# Patient Record
Sex: Male | Born: 1960 | Race: White | Hispanic: No | Marital: Married | Smoking: Never smoker
Health system: Southern US, Community
[De-identification: ages and names within clinical notes are randomized; demographics above are authoritative.]

## PROBLEM LIST (undated history)

## (undated) DIAGNOSIS — K589 Irritable bowel syndrome without diarrhea: Secondary | ICD-10-CM

## (undated) DIAGNOSIS — L570 Actinic keratosis: Secondary | ICD-10-CM

## (undated) DIAGNOSIS — I10 Essential (primary) hypertension: Secondary | ICD-10-CM

## (undated) DIAGNOSIS — F40240 Claustrophobia: Secondary | ICD-10-CM

## (undated) DIAGNOSIS — F411 Generalized anxiety disorder: Secondary | ICD-10-CM

## (undated) DIAGNOSIS — J302 Other seasonal allergic rhinitis: Secondary | ICD-10-CM

## (undated) DIAGNOSIS — M5126 Other intervertebral disc displacement, lumbar region: Secondary | ICD-10-CM

## (undated) DIAGNOSIS — E119 Type 2 diabetes mellitus without complications: Secondary | ICD-10-CM

## (undated) HISTORY — DX: Claustrophobia: F40.240

## (undated) HISTORY — DX: Actinic keratosis: L57.0

## (undated) HISTORY — DX: Type 2 diabetes mellitus without complications: E11.9

## (undated) HISTORY — PX: SPINE SURGERY: SHX786

## (undated) HISTORY — PX: TONSILLECTOMY: SUR1361

## (undated) HISTORY — DX: Irritable bowel syndrome, unspecified: K58.9

## (undated) HISTORY — DX: Other seasonal allergic rhinitis: J30.2

## (undated) HISTORY — DX: Other intervertebral disc displacement, lumbar region: M51.26

## (undated) HISTORY — DX: Generalized anxiety disorder: F41.1

## (undated) HISTORY — PX: COLON SURGERY: SHX602

## (undated) HISTORY — DX: Essential (primary) hypertension: I10

---

## 2010-12-05 HISTORY — PX: COLONOSCOPY: SHX174

## 2014-05-21 ENCOUNTER — Ambulatory Visit: Payer: Self-pay | Admitting: Internal Medicine

## 2014-06-21 ENCOUNTER — Encounter: Payer: Self-pay | Admitting: Family Medicine

## 2014-06-23 ENCOUNTER — Ambulatory Visit (INDEPENDENT_AMBULATORY_CARE_PROVIDER_SITE_OTHER): Payer: BC Managed Care – PPO | Admitting: Family Medicine

## 2014-06-23 ENCOUNTER — Encounter: Payer: Self-pay | Admitting: Family Medicine

## 2014-06-23 VITALS — BP 136/90 | HR 72 | Temp 98.2°F | Ht 67.75 in | Wt 211.7 lb

## 2014-06-23 DIAGNOSIS — K589 Irritable bowel syndrome without diarrhea: Secondary | ICD-10-CM | POA: Insufficient documentation

## 2014-06-23 DIAGNOSIS — J302 Other seasonal allergic rhinitis: Secondary | ICD-10-CM

## 2014-06-23 DIAGNOSIS — J309 Allergic rhinitis, unspecified: Secondary | ICD-10-CM

## 2014-06-23 DIAGNOSIS — M25529 Pain in unspecified elbow: Secondary | ICD-10-CM

## 2014-06-23 DIAGNOSIS — E669 Obesity, unspecified: Secondary | ICD-10-CM | POA: Insufficient documentation

## 2014-06-23 DIAGNOSIS — I10 Essential (primary) hypertension: Secondary | ICD-10-CM

## 2014-06-23 DIAGNOSIS — F411 Generalized anxiety disorder: Secondary | ICD-10-CM | POA: Insufficient documentation

## 2014-06-23 DIAGNOSIS — M25521 Pain in right elbow: Secondary | ICD-10-CM | POA: Insufficient documentation

## 2014-06-23 DIAGNOSIS — E66811 Obesity, class 1: Secondary | ICD-10-CM | POA: Insufficient documentation

## 2014-06-23 MED ORDER — HYDROCHLOROTHIAZIDE 25 MG PO TABS: 25.0000 mg | ORAL_TABLET | Freq: Every day | ORAL | Status: DC

## 2014-06-23 MED ORDER — LOSARTAN POTASSIUM 100 MG PO TABS: 100.0000 mg | ORAL_TABLET | Freq: Every day | ORAL | Status: DC

## 2014-06-23 NOTE — Assessment & Plan Note (Signed)
Worsened after divorce. Well controlled on effexor xr 75mg  daily Continue med.

## 2014-06-23 NOTE — Patient Instructions (Addendum)
Try exercises for elbow pain. Stop amlodipine and hydrochlorothiazide. Start losartan 100mg  once daily - return in 10 days for kidney function check and other fasting labwork. Continue effexor. Good to meet you today, call us with questions. Return in 2-3 months for follow up blood pressure

## 2014-06-23 NOTE — Progress Notes (Signed)
Pre visit review using our clinic review tool, if applicable. No additional management support is needed unless otherwise documented below in the visit note. 

## 2014-06-23 NOTE — Progress Notes (Signed)
BP 136/90  Pulse 72  Temp(Src) 98.2 F (36.8 C) (Oral)  Ht 5' 7.75" (1.721 m)  Wt 211 lb 11 oz (96.021 kg)  BMI 32.42 kg/m2   CC: new pt to establish care  Subjective:    Patient ID: Jermaine Harris, male    DOB: 27-Sep-1961, 53 y.o.   MRN: [id number]  HPI: Merril Nagy is a 53 y.o. male presenting on 06/23/2014 for [place]   Prior saw [employer] practice. Recent trip to [place] for fishing.  ~15 lb weight gain in last 1.5 years.  HTN - complaint with amlodipine 10mg  daily as well as hctz 25mg  daily. Actually ran out of hctz 2 wks ago. H/o cough on ACEI. No HA, vision changes, CP/tightness, SOB, leg swelling. States amlodipine and hctz have not really lower blood pressures.  Anxiety - with claustrophobia leading to panic attacks. Trouble after recent divorce. Some sexual dysfunction. Happy with effexor xr effect (takes 75mg  daily).  IBS - stable recently.  R tennis elbow - treated with band and advil.  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 HTN (hypertension)   . Claustrophobia   . Seasonal allergies   . Irritable bowel syndrome   . Actinic keratosis   . GAD (generalized anxiety disorder)     claustrophobia, some panic attacks     Relevant past medical, surgical, family and social history reviewed and updated as indicated.  Allergies and medications reviewed and updated. No current outpatient prescriptions on file prior to visit.   No current facility-administered medications on file prior to visit.    Review of Systems Per HPI unless specifically indicated above    Objective:    BP 136/90  Pulse 72  Temp(Src) 98.2  F (36.8 C) (Oral)  Ht 5' 7.75" (1.721 m)  Wt 211 lb 11 oz (96.021 kg)  BMI 32.42 kg/m2  Physical Exam  Nursing note and vitals reviewed. Constitutional: He appears well-developed and well-nourished. No distress.  HENT:  Head: Normocephalic and atraumatic.  Mouth/Throat: Oropharynx is clear and moist. No oropharyngeal exudate.  Eyes: Conjunctivae and EOM are normal. Pupils are equal, round, and reactive to light. No scleral icterus.  Neck: Normal range of motion. Neck supple. No thyromegaly present.  Cardiovascular: Normal rate, regular rhythm, normal heart sounds and intact distal pulses.   No murmur heard. Pulmonary/Chest: Effort normal and breath sounds normal. No respiratory distress. He has no wheezes. He has no rales.  Musculoskeletal: He exhibits no edema.  Lymphadenopathy:    He has no cervical adenopathy.  Skin: Skin is warm and dry. No rash noted.  Psychiatric: He has a normal mood and affect.   No results found for this or any previous visit.    Assessment & Plan:   Problem List Items Addressed This Visit   Seasonal allergies   Right elbow pain     More consistent with elbow tendonitis - provided with exercises from [facility] pt advisor.    Obesity   Irritable bowel syndrome     Stable on effexor.    HTN (hypertension) - Primary     Per patient, amlodipine and hctz both have not affected blood pressures. Will stop these meds,  retrial losartan at 100mg  daily rtc 10d for labwork to check Cr. Pt agrees with plan.    Relevant Medications      losartan (COZAAR) tablet   GAD (generalized anxiety disorder)     Worsened after divorce. Well controlled on effexor xr 75mg  daily Continue med.        Follow up plan: Return in about 3 months (around 09/23/2014), or if symptoms worsen or fail to improve.

## 2014-06-23 NOTE — Assessment & Plan Note (Signed)
More consistent with elbow tendonitis - provided with exercises from [facility] pt advisor.

## 2014-06-23 NOTE — Assessment & Plan Note (Signed)
Stable on effexor.   

## 2014-06-23 NOTE — Assessment & Plan Note (Signed)
Per patient, amlodipine and hctz both have not affected blood pressures. Will stop these meds, retrial losartan at 100mg  daily rtc 10d for labwork to check Cr. Pt agrees with plan.

## 2014-07-03 ENCOUNTER — Other Ambulatory Visit (INDEPENDENT_AMBULATORY_CARE_PROVIDER_SITE_OTHER): Payer: BC Managed Care – PPO

## 2014-07-03 ENCOUNTER — Encounter: Payer: Self-pay | Admitting: Family Medicine

## 2014-07-03 ENCOUNTER — Ambulatory Visit (INDEPENDENT_AMBULATORY_CARE_PROVIDER_SITE_OTHER): Payer: BC Managed Care – PPO | Admitting: Family Medicine

## 2014-07-03 VITALS — BP 130/80 | HR 80 | Temp 98.5°F | Wt 213.8 lb

## 2014-07-03 DIAGNOSIS — I10 Essential (primary) hypertension: Secondary | ICD-10-CM

## 2014-07-03 DIAGNOSIS — M5430 Sciatica, unspecified side: Secondary | ICD-10-CM

## 2014-07-03 DIAGNOSIS — M5431 Sciatica, right side: Secondary | ICD-10-CM | POA: Insufficient documentation

## 2014-07-03 LAB — COMPREHENSIVE METABOLIC PANEL
ALT: 41 U/L (ref 0–53)
AST: 25 U/L (ref 0–37)
Albumin: 4.2 g/dL (ref 3.5–5.2)
Alkaline Phosphatase: 64 U/L (ref 39–117)
BUN: 23 mg/dL (ref 6–23)
CO2: 26 mEq/L (ref 19–32)
Calcium: 9.1 mg/dL (ref 8.4–10.5)
Chloride: 110 mEq/L (ref 96–112)
Creatinine, Ser: 1.3 mg/dL (ref 0.4–1.5)
GFR: 60.54 mL/min (ref >60.00)
Glucose, Bld: 124 mg/dL — ABNORMAL HIGH (ref 70–99)
Potassium: 4.3 mEq/L (ref 3.5–5.1)
Sodium: 142 mEq/L (ref 135–145)
Total Bilirubin: 0.4 mg/dL (ref 0.2–1.2)
Total Protein: 7.3 g/dL (ref 6.0–8.3)

## 2014-07-03 LAB — TSH: TSH: 2.98 u[IU]/mL (ref 0.35–4.50)

## 2014-07-03 LAB — LIPID PANEL
Cholesterol: 154 mg/dL (ref 0–200)
HDL: 35 mg/dL — ABNORMAL LOW (ref >39.00)
LDL Cholesterol: 79 mg/dL (ref 0–99)
NonHDL: 119
Total CHOL/HDL Ratio: 4
Triglycerides: 199 mg/dL — ABNORMAL HIGH (ref 0.0–149.0)
VLDL: 39.8 mg/dL (ref 0.0–40.0)

## 2014-07-03 MED ORDER — CYCLOBENZAPRINE HCL 10 MG PO TABS: 5.0000 mg | ORAL_TABLET | Freq: Two times a day (BID) | ORAL | Status: DC | PRN

## 2014-07-03 MED ORDER — PREDNISONE 20 MG PO TABS: ORAL_TABLET | ORAL | Status: DC

## 2014-07-03 NOTE — Patient Instructions (Signed)
I do think we have sciatica causing your pain - treat with prednisone taper and muscle relaxant Do stretching exercises provided today. May ice/heat buttock (whichever soothes area better). Let us know if not improving as expected.  Watch for new symptoms like weakness or worsening numbness of leg, fever, or any bowel/bladder accidents.

## 2014-07-03 NOTE — Progress Notes (Signed)
   BP 130/80  Pulse 80  Temp(Src) 98.5 F (36.9 C) (Oral)  Wt 213 lb 12 oz (96.956 kg)   CC: R leg pain  Subjective:    Patient ID: Jermaine Harris, male    DOB: 1961/09/24, 53 y.o.   MRN: [id number]  HPI: Jermaine Harris is a 53 y.o. male presenting on 07/03/2014 for Back Pain   Worsening spasm R leg - started 2 wks ago in R calf and over last 3 days noticed worsening pain. Worse with bending over and with transition from sitting to standing. Improved with laying down.  Tried ibuprofen, heating pad, tried tizanidine (some sedation).   No fevers/chills, denies inciting trauma, bowel/bladder accidents, leg numbness.  Relevant past medical, surgical, family and social history reviewed and updated as indicated.  Allergies and medications reviewed and updated. Current Outpatient Prescriptions on File Prior to Visit  Medication Sig  . ibuprofen (ADVIL,MOTRIN) 200 MG tablet Take 200 mg by mouth as needed.  Marland Kitchen losartan (COZAAR) 100 MG tablet Take 1 tablet (100 mg total) by mouth daily.  Marland Kitchen venlafaxine XR (EFFEXOR-XR) 75 MG 24 hr capsule Take 75 mg by mouth daily with breakfast.   No current facility-administered medications on file prior to visit.    Review of Systems Per HPI unless specifically indicated above    Objective:    BP 130/80  Pulse 80  Temp(Src) 98.5 F (36.9 C) (Oral)  Wt 213 lb 12 oz (96.956 kg)  Physical Exam  Nursing note and vitals reviewed. Constitutional: He appears well-developed and well-nourished. No distress.  Musculoskeletal: He exhibits no edema.  No pain midline spine No paraspinous mm tenderness + SLR on right. No pain with int/ext rotation at hip. Neg FABER. No pain at SIJ, GTB or sciatic notch bilaterally.   Neurological: He is alert. He has normal strength. No sensory deficit.  Reflex Scores:      Patellar reflexes are 2+ on the right side and 2+ on the left side. 5/5 BLE strength Intact to light touch BLE       Assessment & Plan:    Problem List Items Addressed This Visit   Right sided sciatica - Primary     Anticipate R sided sciatica from piriformis syndrome although no pain at sciatic notch today. Discussed treatment with prednisone (avoid NSAIDs given recent borderline kidney function) and flexeril course. Also discussed ice/heat and provided with exercises from [facility] pt advisor for piriformis syndrome. Update if sxs persist or fail to improve. Red flags to return discussed.    Relevant Medications      cyclobenzaprine (FLEXERIL) tablet       Follow up plan: Return if symptoms worsen or fail to improve.

## 2014-07-03 NOTE — Assessment & Plan Note (Signed)
Anticipate R sided sciatica from piriformis syndrome although no pain at sciatic notch today. Discussed treatment with prednisone (avoid NSAIDs given recent borderline kidney function) and flexeril course. Also discussed ice/heat and provided with exercises from [facility] pt advisor for piriformis syndrome. Update if sxs persist or fail to improve. Red flags to return discussed.

## 2014-07-03 NOTE — Progress Notes (Signed)
Pre visit review using our clinic review tool, if applicable. No additional management support is needed unless otherwise documented below in the visit note. 

## 2014-07-10 ENCOUNTER — Telehealth: Payer: Self-pay

## 2014-07-10 MED ORDER — PREDNISONE 20 MG PO TABS
ORAL_TABLET | ORAL | Status: DC
Start: 2014-07-10 — End: 2014-09-30

## 2014-07-10 NOTE — Telephone Encounter (Signed)
Patient advised.

## 2014-07-10 NOTE — Telephone Encounter (Signed)
Pt left v/m; pt was seen on 07/03/14; pt has finished prednisone for sciatica. Pt had gotten relief but since stopping prednisone pain has returned. Pt is leaving very early morning flight 07/11/14 for week long business trip out of country. Pt request ref[facility]. Pt request cb.

## 2014-07-10 NOTE — Telephone Encounter (Signed)
Sent, thanks. Take with food. F/u prn.

## 2014-09-30 ENCOUNTER — Ambulatory Visit (INDEPENDENT_AMBULATORY_CARE_PROVIDER_SITE_OTHER): Payer: BC Managed Care – PPO | Admitting: Family Medicine

## 2014-09-30 ENCOUNTER — Telehealth: Payer: Self-pay | Admitting: Family Medicine

## 2014-09-30 ENCOUNTER — Encounter: Payer: Self-pay | Admitting: Family Medicine

## 2014-09-30 VITALS — BP 130/90 | HR 72 | Temp 97.8°F | Wt 217.0 lb

## 2014-09-30 DIAGNOSIS — F411 Generalized anxiety disorder: Secondary | ICD-10-CM

## 2014-09-30 DIAGNOSIS — I10 Essential (primary) hypertension: Secondary | ICD-10-CM

## 2014-09-30 MED ORDER — LOSARTAN POTASSIUM 100 MG PO TABS: 100.0000 mg | ORAL_TABLET | Freq: Every day | ORAL | Status: DC

## 2014-09-30 NOTE — Progress Notes (Signed)
Pre visit review using our clinic review tool, if applicable. No additional management support is needed unless otherwise documented below in the visit note. 

## 2014-09-30 NOTE — Patient Instructions (Addendum)
Good to see you today, call us with questions. Return at your convenience or in 6 months for annual exam. Continue monitoring blood pressure and let me know if persistently >140/90.  Your goal blood pressure is <140/90. Work on low salt/sodium diet - goal <1.5gm (1,500mg ) per day. Eat a diet high in fruits/vegetables and whole grains.  Look into mediterranean and DASH diet. Goal activity is 171min/wk of moderate intensity exercise.  This can be split into 30 minute chunks.  If you are not at this level, you can start with smaller 10-15 min increments and slowly build up activity. Look at [place].org for more resources  DASH Eating Plan DASH stands for "Dietary Approaches to Stop Hypertension." The DASH eating plan is a healthy eating plan that has been shown to reduce high blood pressure (hypertension). Additional health benefits may include reducing the risk of type 2 diabetes mellitus, heart disease, and stroke. The DASH eating plan may also help with weight loss. WHAT DO I NEED TO KNOW ABOUT THE DASH EATING PLAN? For the DASH eating plan, you will follow these general guidelines:  Choose foods with a percent daily value for sodium of less than 5% (as listed on the food label).  Use salt-free seasonings or herbs instead of table salt or sea salt.  Check with your health care provider or pharmacist before using salt substitutes.  Eat lower-sodium products, often labeled as "lower sodium" or "no salt added."  Eat fresh foods.  Eat more vegetables, fruits, and low-fat dairy products.  Choose whole grains. Look for the word "whole" as the first word in the ingredient list.  Choose fish and skinless chicken or [place] more often than red meat. Limit fish, poultry, and meat to 6 oz (170 g) each day.  Limit sweets, desserts, sugars, and sugary drinks.  Choose heart-healthy fats.  Limit cheese to 1 oz (28 g) per day.  Eat more home-cooked food and less restaurant, buffet, and fast  food.  Limit fried foods.  Cook foods using methods other than frying.  Limit canned vegetables. If you do use them, rinse them well to decrease the sodium.  When eating at a restaurant, ask that your food be prepared with less salt, or no salt if possible. WHAT FOODS CAN I EAT? Seek help from a dietitian for individual calorie needs. Grains Whole grain or whole wheat bread. Brown rice. Whole grain or whole wheat pasta. Quinoa, bulgur, and whole grain cereals. Low-sodium cereals. Corn or whole wheat flour tortillas. Whole grain cornbread. Whole grain crackers. Low-sodium crackers. Vegetables Fresh or frozen vegetables (raw, steamed, roasted, or grilled). Low-sodium or reduced-sodium tomato and vegetable juices. Low-sodium or reduced-sodium tomato sauce and paste. Low-sodium or reduced-sodium canned vegetables.  Fruits All fresh, canned (in natural juice), or frozen fruits. Meat and Other Protein Products Ground beef (85% or leaner), grass-fed beef, or beef trimmed of fat. Skinless chicken or [place]. Ground chicken or [place]. Pork trimmed of fat. All fish and seafood. Eggs. Dried beans, peas, or lentils. Unsalted nuts and seeds. Unsalted canned beans. Dairy Low-fat dairy products, such as skim or 1% milk, 2% or reduced-fat cheeses, low-fat ricotta or cottage cheese, or plain low-fat yogurt. Low-sodium or reduced-sodium cheeses. Fats and Oils Tub margarines without trans fats. Light or reduced-fat mayonnaise and salad dressings (reduced sodium). Avocado. Safflower, olive, or canola oils. Natural peanut or almond butter. Other Unsalted popcorn and pretzels. The items listed above may not be a complete list of recommended foods or beverages. Contact your  dietitian for more options. WHAT FOODS ARE NOT RECOMMENDED? Grains White bread. White pasta. White rice. Refined cornbread. Bagels and croissants. Crackers that contain trans fat. Vegetables Creamed or fried vegetables. Vegetables in a  cheese sauce. Regular canned vegetables. Regular canned tomato sauce and paste. Regular tomato and vegetable juices. Fruits Dried fruits. Canned fruit in light or heavy syrup. Fruit juice. Meat and Other Protein Products Fatty cuts of meat. Ribs, chicken wings, bacon, sausage, bologna, salami, chitterlings, fatback, hot dogs, bratwurst, and packaged luncheon meats. Salted nuts and seeds. Canned beans with salt. Dairy Whole or 2% milk, cream, half-and-half, and cream cheese. Whole-fat or sweetened yogurt. Full-fat cheeses or blue cheese. Nondairy creamers and whipped toppings. Processed cheese, cheese spreads, or cheese curds. Condiments Onion and garlic salt, seasoned salt, table salt, and sea salt. Canned and packaged gravies. Worcestershire sauce. Tartar sauce. Barbecue sauce. Teriyaki sauce. Soy sauce, including reduced sodium. Steak sauce. Fish sauce. Oyster sauce. Cocktail sauce. Horseradish. Ketchup and mustard. Meat flavorings and tenderizers. Bouillon cubes. Hot sauce. Tabasco sauce. Marinades. Taco seasonings. Relishes. Fats and Oils Butter, stick margarine, lard, shortening, ghee, and bacon fat. Coconut, palm kernel, or palm oils. Regular salad dressings. Other Pickles and olives. Salted popcorn and pretzels. The items listed above may not be a complete list of foods and beverages to avoid. Contact your dietitian for more information. WHERE CAN I FIND MORE INFORMATION? National Heart, Lung, and Blood Institute: travelstabloid.com Document Released: 11/10/2011 Document Revised: 04/07/2014 Document Reviewed: 09/25/2013 [facility] Patient Information 2015 [place], [place]. This information is not intended to replace advice given to you by your health care provider. Make sure you discuss any questions you have with your health care provider.

## 2014-09-30 NOTE — Assessment & Plan Note (Signed)
Stable on effexor xr 75mg  daily. Continue med.

## 2014-09-30 NOTE — Assessment & Plan Note (Signed)
Chronic, stable.  Continue losartan 100 mg daily 

## 2014-09-30 NOTE — Telephone Encounter (Signed)
Pt will be out of Losartan in about 10 day and will need a refill.  [facility] [address]

## 2014-09-30 NOTE — Telephone Encounter (Signed)
Refilled

## 2014-09-30 NOTE — Progress Notes (Signed)
BP 130/90  Pulse 72  Temp(Src) 97.8 F (36.6 C) (Oral)  Wt 217 lb (98.431 kg)   CC: 3 mo f/u visit  Subjective:    Patient ID: Jermaine Harris, male    DOB: 1961-11-18, 53 y.o.   MRN: [id number]  HPI: Jermaine Harris is a 54 y.o. male presenting on 09/30/2014 for Follow-up   HTN - per patient, amlodipine, hctz were not effective to control bp - so 06/2014 we retried losartan 100mg  daily. complaint with this med. At drug store bp has been elevated. Hasn't checked in last month. H/o cough on ACEI. Denies HA, vision changes, CP/tightness, SOB, leg swelling. Lab Results  Component Value Date   CREATININE 1.3 07/03/2014   BP Readings from Last 3 Encounters:  09/30/14 130/90  07/03/14 130/80  06/23/14 136/90   Intermittent R calf pain thought sciatica related. Some flaring after trip to [place]. Has stretching exercises at home.   Anxiety - with claustrophobia leading to panic attacks. Trouble after recent divorce. Some sexual dysfunction. Happy with effexor xr effect (takes 75mg  daily). Busy at work.   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   Relevant past medical, surgical, family and social history reviewed and updated as indicated.  Allergies and medications reviewed and updated. Current Outpatient Prescriptions on File Prior to Visit  Medication Sig  . ibuprofen (ADVIL,MOTRIN) 200 MG tablet Take 200 mg by mouth as needed.  Marland Kitchen losartan (COZAAR) 100 MG tablet Take 1 tablet (100 mg total) by mouth daily.  Marland Kitchen venlafaxine XR (EFFEXOR-XR) 75 MG 24 hr capsule Take 75 mg by mouth daily with breakfast.   No current facility-administered medications on file prior to visit.    Review of Systems Per HPI unless specifically indicated above    Objective:    BP 130/90  Pulse 72  Temp(Src) 97.8 F (36.6 C) (Oral)  Wt 217 lb (98.431 kg)  Physical Exam  Nursing note and vitals reviewed. Constitutional: He appears well-developed and well-nourished. No distress.  HENT:    Mouth/Throat: Oropharynx is clear and moist. No oropharyngeal exudate.  Cardiovascular: Normal rate, regular rhythm, normal heart sounds and intact distal pulses.   No murmur heard. Pulmonary/Chest: Effort normal and breath sounds normal. No respiratory distress. He has no wheezes. He has no rales.  Musculoskeletal: He exhibits no edema.  Psychiatric: He has a normal mood and affect.   Results for orders placed in visit on 07/03/14  LIPID PANEL      Result Value Ref Range   Cholesterol 154  0 - 200 mg/dL   Triglycerides 199.0 (*) 0.0 - 149.0 mg/dL   HDL 35.00 (*) >39.00 mg/dL   VLDL 39.8  0.0 - 40.0 mg/dL   LDL Cholesterol 79  0 - 99 mg/dL   Total CHOL/HDL Ratio 4     NonHDL 119.00    COMPREHENSIVE METABOLIC PANEL      Result Value Ref Range   Sodium 142  135 - 145 mEq/L   Potassium 4.3  3.5 - 5.1 mEq/L   Chloride 110  96 - 112 mEq/L   CO2 26  19 - 32 mEq/L   Glucose, Bld 124 (*) 70 - 99 mg/dL   BUN 23  6 - 23 mg/dL   Creatinine, Ser 1.3  0.4 - 1.5 mg/dL   Total Bilirubin 0.4  0.2 - 1.2 mg/dL   Alkaline Phosphatase 64  39 - 117 U/L   AST 25  0 - 37 U/L   ALT 41  0 - 53 U/L   Total Protein 7.3  6.0 - 8.3 g/dL   Albumin 4.2  3.5 - 5.2 g/dL   Calcium 9.1  8.4 - 10.5 mg/dL   GFR 60.54  >60.00 mL/min  TSH      Result Value Ref Range   TSH 2.98  0.35 - 4.50 uIU/mL      Assessment & Plan:   Problem List Items Addressed This Visit   HTN (hypertension) - Primary     Chronic, stable. Continue losartan 100mg  daily.    GAD (generalized anxiety disorder)     Stable on effexor xr 75mg  daily. Continue med.        Follow up plan: Return in about 6 months (around 04/01/2015), or as needed, for annual exam, prior fasting for blood work.

## 2014-09-30 NOTE — Addendum Note (Signed)
Addended by: Royann Shivers A on: 09/30/2014 09:33 AM   Modules accepted: Orders

## 2014-10-01 ENCOUNTER — Telehealth: Payer: Self-pay | Admitting: Family Medicine

## 2014-10-01 NOTE — Telephone Encounter (Signed)
emmi emailed °

## 2014-11-12 ENCOUNTER — Encounter: Payer: Self-pay | Admitting: Family Medicine

## 2014-11-12 ENCOUNTER — Ambulatory Visit (INDEPENDENT_AMBULATORY_CARE_PROVIDER_SITE_OTHER): Payer: BC Managed Care – PPO | Admitting: Family Medicine

## 2014-11-12 VITALS — BP 140/90 | HR 80 | Temp 97.7°F | Wt 214.5 lb

## 2014-11-12 DIAGNOSIS — M5431 Sciatica, right side: Secondary | ICD-10-CM

## 2014-11-12 MED ORDER — PREDNISONE 20 MG PO TABS: ORAL_TABLET | ORAL | Status: DC

## 2014-11-12 NOTE — Patient Instructions (Addendum)
Recurrent sciatica - treat with another prednisone course sent to pharmacy. Work on activity and weight loss as up to now - congratulations on healthy changes.

## 2014-11-12 NOTE — Assessment & Plan Note (Signed)
Recurrent R sciatica anticipate from piriformis syndrome. Treat with prednisone course as well as tylenol prn pain while on prednisone then may return to NsAID after finishes prednisone course. Discussed healthy lifestyle /diet changes for weight loss to help prevent recurrence of pain. Update if not improving as expected, would consider lumbar imaging.

## 2014-11-12 NOTE — Progress Notes (Signed)
Pre visit review using our clinic review tool, if applicable. No additional management support is needed unless otherwise documented below in the visit note. 

## 2014-11-12 NOTE — Progress Notes (Signed)
BP 140/90 mmHg  Pulse 80  Temp(Src) 97.7 F (36.5 C) (Oral)  Wt 214 lb 8 oz (97.297 kg)   CC: L leg pain  Subjective:    Patient ID: Jermaine Harris, male    DOB: 01-May-1961, 53 y.o.   MRN: [id number]  HPI: Jermaine Harris is a 53 y.o. male presenting on 11/12/2014 for Leg Pain   See prior notes for details - briefly, treated here 07/03/2014 for R sided sciatica from piriformis syndrome with prednisone course (no NSAIDs given borderline kidney function) and flexeril course. Prednisone course was repeated a few weeks later with good improvement.   Mid October on trip to [facility] noticed back pain start bothering him again. R lowe back with radiation down posterior leg and into calf.  Denies numbness/weakness of leg. No fevers, bowel/bladder changes. Massage therapy helped. Ibuprofen helps.  Wants to work on weight loss. Cutting down on carbs, sweets, decreased lunch portion sizes. Noticing 8 lb weight loss over last week. Started using myfitness pal app. Working on 1400cal diet. Goal <200 lbs by late January. Thinking about increasing activity levels  Went through divorce in May, increased stress.   Relevant past medical, surgical, family and social history reviewed and updated as indicated. Interim medical history since our last visit reviewed. Allergies and medications reviewed and updated.  Current Outpatient Prescriptions on File Prior to Visit  Medication Sig  . ibuprofen (ADVIL,MOTRIN) 200 MG tablet Take 200 mg by mouth as needed.  Marland Kitchen losartan (COZAAR) 100 MG tablet Take 1 tablet (100 mg total) by mouth daily.  Marland Kitchen venlafaxine XR (EFFEXOR-XR) 75 MG 24 hr capsule Take 75 mg by mouth daily with breakfast.   No current facility-administered medications on file prior to visit.    Review of Systems Per HPI unless specifically indicated above     Objective:    BP 140/90 mmHg  Pulse 80  Temp(Src) 97.7 F (36.5 C) (Oral)  Wt 214 lb 8 oz (97.297 kg)  Wt Readings from Last 3  Encounters:  11/12/14 214 lb 8 oz (97.297 kg)  09/30/14 217 lb (98.431 kg)  07/03/14 213 lb 12 oz (96.956 kg)    Physical Exam  Constitutional: He appears well-developed and well-nourished. No distress.  Musculoskeletal: He exhibits no edema.  No pain midline spine No paraspinous mm tenderness Neg SLR bilaterally. No pain with int/ext rotation at hip. Neg FABER. No pain at SIJ, GTB bilaterally. Tender at R sciatic notch  Neurological: He has normal strength. No sensory deficit.  Reflex Scores:      Patellar reflexes are 2+ on the right side and 2+ on the left side. 5/5 strength BUE  Nursing note and vitals reviewed.  Results for orders placed or performed in visit on 07/03/14  Lipid panel  Result Value Ref Range   Cholesterol 154 0 - 200 mg/dL   Triglycerides 199.0 (H) 0.0 - 149.0 mg/dL   HDL 35.00 (L) >39.00 mg/dL   VLDL 39.8 0.0 - 40.0 mg/dL   LDL Cholesterol 79 0 - 99 mg/dL   Total CHOL/HDL Ratio 4    NonHDL 119.00   Comprehensive metabolic panel  Result Value Ref Range   Sodium 142 135 - 145 mEq/L   Potassium 4.3 3.5 - 5.1 mEq/L   Chloride 110 96 - 112 mEq/L   CO2 26 19 - 32 mEq/L   Glucose, Bld 124 (H) 70 - 99 mg/dL   BUN 23 6 - 23 mg/dL   Creatinine, Ser 1.3 0.4 - 1.5  mg/dL   Total Bilirubin 0.4 0.2 - 1.2 mg/dL   Alkaline Phosphatase 64 39 - 117 U/L   AST 25 0 - 37 U/L   ALT 41 0 - 53 U/L   Total Protein 7.3 6.0 - 8.3 g/dL   Albumin 4.2 3.5 - 5.2 g/dL   Calcium 9.1 8.4 - 10.5 mg/dL   GFR 60.54 >60.00 mL/min  TSH  Result Value Ref Range   TSH 2.98 0.35 - 4.50 uIU/mL      Assessment & Plan:   Problem List Items Addressed This Visit    Right sided sciatica - Primary    Recurrent R sciatica anticipate from piriformis syndrome. Treat with prednisone course as well as tylenol prn pain while on prednisone then may return to NsAID after finishes prednisone course. Discussed healthy lifestyle /diet changes for weight loss to help prevent recurrence of  pain. Update if not improving as expected, would consider lumbar imaging.        Follow up plan: Return if symptoms worsen or fail to improve.

## 2014-12-01 ENCOUNTER — Other Ambulatory Visit: Payer: Self-pay | Admitting: *Deleted

## 2014-12-01 MED ORDER — VENLAFAXINE HCL ER 75 MG PO CP24: 75.0000 mg | ORAL_CAPSULE | Freq: Every day | ORAL | Status: DC

## 2014-12-01 NOTE — Telephone Encounter (Signed)
#  30 x 2 sent electronically to pharmacy. Pt has a cpx scheduled in 5/16.

## 2014-12-24 ENCOUNTER — Ambulatory Visit (INDEPENDENT_AMBULATORY_CARE_PROVIDER_SITE_OTHER): Payer: BLUE CROSS/BLUE SHIELD | Admitting: Family Medicine

## 2014-12-24 ENCOUNTER — Encounter: Payer: Self-pay | Admitting: Family Medicine

## 2014-12-24 ENCOUNTER — Ambulatory Visit (INDEPENDENT_AMBULATORY_CARE_PROVIDER_SITE_OTHER)
Admission: RE | Admit: 2014-12-24 | Discharge: 2014-12-24 | Disposition: A | Discharge status: Home / Self Care | Payer: BLUE CROSS/BLUE SHIELD | Source: Ambulatory Visit | Attending: Family Medicine | Admitting: Family Medicine

## 2014-12-24 VITALS — BP 148/90 | HR 68 | Temp 97.6°F | Wt 212.2 lb

## 2014-12-24 DIAGNOSIS — M5431 Sciatica, right side: Secondary | ICD-10-CM

## 2014-12-24 DIAGNOSIS — J069 Acute upper respiratory infection, unspecified: Secondary | ICD-10-CM

## 2014-12-24 MED ORDER — PREDNISONE 20 MG PO TABS: ORAL_TABLET | ORAL | Status: DC

## 2014-12-24 MED ORDER — METHOCARBAMOL 500 MG PO TABS: 500.0000 mg | ORAL_TABLET | Freq: Three times a day (TID) | ORAL | Status: DC | PRN

## 2014-12-24 NOTE — Addendum Note (Signed)
Addended by: Ria Bush on: 12/24/2014 10:57 AM   Modules accepted: Orders, SmartSet

## 2014-12-24 NOTE — Assessment & Plan Note (Signed)
With mild laryngitis. Supportive care as per instructions. Viral given short duration.

## 2014-12-24 NOTE — Patient Instructions (Signed)
For upper respiratory infection - treat with fluids and rest. May continue mucinex DM. For sciatica - continue ibuprofen, try different muscle relaxant sent to pharmacy. Prednisone prescription provided today. Xray of lower back today. Pass by [facility]'s office to schedule physical therapy. Let us know how you do after therapy.

## 2014-12-24 NOTE — Progress Notes (Signed)
Pre visit review using our clinic review tool, if applicable. No additional management support is needed unless otherwise documented below in the visit note. 

## 2014-12-24 NOTE — Assessment & Plan Note (Addendum)
Anticipate persistent piriformis syndrome. No red flags today. Check lumbar xray today given longevity of sxs and refer to PT.  Treat with another prednisone course as well as trial of robaxin. Update [place] with effect after PT. Pt agrees with plan.

## 2014-12-24 NOTE — Progress Notes (Addendum)
BP 148/90 mmHg  Pulse 68  Temp(Src) 97.6 F (36.4 C) (Oral)  Wt 212 lb 4 oz (96.276 kg)   CC: URI, worsening leg pain  Subjective:    Patient ID: Jermaine Harris, male    DOB: 1961/01/03, 54 y.o.   MRN: [id number]  HPI: Jermaine Harris is a 54 y.o. male presenting on 12/24/2014 for Leg Pain and URI   Recent return from [place] (spent 11 days there).  See prior notes for details - briefly, treated here 07/03/2014 and again 11/2014 for R sided sciatica from piriformis syndrome with prednisone course (no NSAIDs given borderline kidney function) and flexeril course. Has received 3 prednisone courses over last several months.  Pain R buttock with radiation dowh posterior leg. Pain worse with extension of spine, worse with prolonged sitting, some trouble with weight bearing on right 2/2 pain. Pain resolves when standing. Denies numbness/tingling. Possible weakness of R leg. Continues low dose ibuprofen. Has tried flexeril as well which caused oversedation.   URI sxs - ongoing for last 5 days. +sinus congestion. Persistent hoarse voice. + cough but not significantly productive. No fevers/chills. Has tried OTC remedies including mucinex DM.   Relevant past medical, surgical, family and social history reviewed and updated as indicated. Interim medical history since our last visit reviewed. Allergies and medications reviewed and updated. Current Outpatient Prescriptions on File Prior to Visit  Medication Sig  . ibuprofen (ADVIL,MOTRIN) 200 MG tablet Take 200 mg by mouth as needed.  Marland Kitchen losartan (COZAAR) 100 MG tablet Take 1 tablet (100 mg total) by mouth daily.  Marland Kitchen venlafaxine XR (EFFEXOR-XR) 75 MG 24 hr capsule Take 1 capsule (75 mg total) by mouth daily with breakfast.   No current facility-administered medications on file prior to visit.    Review of Systems Per HPI unless specifically indicated above     Objective:    BP 148/90 mmHg  Pulse 68  Temp(Src) 97.6 F (36.4 C) (Oral)  Wt 212  lb 4 oz (96.276 kg)  Wt Readings from Last 3 Encounters:  12/24/14 212 lb 4 oz (96.276 kg)  11/12/14 214 lb 8 oz (97.297 kg)  09/30/14 217 lb (98.431 kg)    Physical Exam  Constitutional: He appears well-developed and well-nourished. No distress.  HENT:  Head: Normocephalic and atraumatic.  Right Ear: Hearing, tympanic membrane, external ear and ear canal normal.  Left Ear: Hearing, tympanic membrane, external ear and ear canal normal.  Nose: Nose normal. No mucosal edema or rhinorrhea. Right sinus exhibits no maxillary sinus tenderness and no frontal sinus tenderness. Left sinus exhibits no maxillary sinus tenderness and no frontal sinus tenderness.  Mouth/Throat: Uvula is midline, oropharynx is clear and moist and mucous membranes are normal. No oropharyngeal exudate, posterior oropharyngeal edema, posterior oropharyngeal erythema or tonsillar abscesses.  Eyes: Conjunctivae and EOM are normal. Pupils are equal, round, and reactive to light. No scleral icterus.  Neck: Normal range of motion. Neck supple.  Cardiovascular: Normal rate, regular rhythm, normal heart sounds and intact distal pulses.   No murmur heard. Pulmonary/Chest: Effort normal and breath sounds normal. No respiratory distress. He has no wheezes. He has no rales.  Musculoskeletal: He exhibits no edema.  No pain midline spine No paraspinous mm tenderness Neg SLR bilaterally. No pain with int/ext rotation at hip. Neg FABER. No pain at SIJ, GTB or sciatic notch bilaterally.  Lymphadenopathy:    He has no cervical adenopathy.  Neurological: He is alert. He has normal strength. No sensory deficit. Coordination and  gait normal.  Reflex Scores:      Patellar reflexes are 2+ on the right side.      Achilles reflexes are 2+ on the right side and 2+ on the left side. 5/5 strength BLE  Skin: Skin is warm and dry. No rash noted.  Nursing note and vitals reviewed.      Assessment & Plan:  A total of 25 minutes were spent  face-to-face with the patient during this encounter and over half of that time was spent on counseling and coordination of care   Problem List Items Addressed This Visit    Viral URI    With mild laryngitis. Supportive care as per instructions. Viral given short duration.        Right sided sciatica - Primary    Anticipate persistent piriformis syndrome. No red flags today. Check lumbar xray today given longevity of sxs and refer to PT.  Treat with another prednisone course as well as trial of robaxin. Update [place] with effect after PT. Pt agrees with plan.      Relevant Medications   methocarbamol (ROBAXIN) tablet   Other Relevant Orders   Ambulatory referral to Physical Therapy   DG Lumbar Spine Complete       Follow up plan: Return if symptoms worsen or fail to improve.

## 2015-02-23 ENCOUNTER — Telehealth: Payer: Self-pay

## 2015-02-23 DIAGNOSIS — I10 Essential (primary) hypertension: Secondary | ICD-10-CM

## 2015-02-23 MED ORDER — VALSARTAN 320 MG PO TABS: 320.0000 mg | ORAL_TABLET | Freq: Every day | ORAL | Status: DC

## 2015-02-23 NOTE — Telephone Encounter (Signed)
Patient notified and verbalized understanding. He will call with an update.

## 2015-02-23 NOTE — Telephone Encounter (Signed)
BP Readings from Last 3 Encounters:  12/24/14 148/90  11/12/14 140/90  09/30/14 130/90  we can try different ARB - sent in diovan (valsartan) for pt to try. Update [place] with effect after 2-3 weeks.

## 2015-02-23 NOTE — Telephone Encounter (Signed)
Pt left v/m; pt has been taking losartan 100 mg daily; pt does not think med is effective and wants to know if can have med change and request cb. Spoke with pt and BP at home is averaging high 140's/90. No H/A,dizziness, CP or SOB. [facility] Pao has [facility] scheduled 04/07/15.pt would like to try different med without coming in for appt.Please advise.

## 2015-03-05 ENCOUNTER — Other Ambulatory Visit: Payer: Self-pay | Admitting: Family Medicine

## 2015-03-26 ENCOUNTER — Ambulatory Visit (INDEPENDENT_AMBULATORY_CARE_PROVIDER_SITE_OTHER): Payer: BLUE CROSS/BLUE SHIELD | Admitting: Family Medicine

## 2015-03-26 ENCOUNTER — Encounter: Payer: Self-pay | Admitting: Family Medicine

## 2015-03-26 VITALS — BP 144/96 | HR 65 | Temp 98.0°F | Wt 207.5 lb

## 2015-03-26 DIAGNOSIS — E669 Obesity, unspecified: Secondary | ICD-10-CM

## 2015-03-26 DIAGNOSIS — I10 Essential (primary) hypertension: Secondary | ICD-10-CM | POA: Diagnosis not present

## 2015-03-26 DIAGNOSIS — M5431 Sciatica, right side: Secondary | ICD-10-CM | POA: Diagnosis not present

## 2015-03-26 MED ORDER — CLONIDINE HCL 0.1 MG PO TABS
0.1000 mg | ORAL_TABLET | Freq: Two times a day (BID) | ORAL | Status: DC
Start: 2015-03-26 — End: 2015-04-07

## 2015-03-26 NOTE — Assessment & Plan Note (Signed)
First losartan now diovan not controlling bp - pt endorses same bp readings on and off ARB. Lisinopril caused cough. Amlodipine, hctz ineffective. Will trial clonidine, start at 0.1mg  bid. Discussed rebound hypertension if suddenly stopped. Discussed monitoring heart rate for bradycardia. Pt will return on tuesday for labs and nurse visit to compare home bp with our bp readings. Has CPE appt in 2 wks.

## 2015-03-26 NOTE — Assessment & Plan Note (Addendum)
Body mass index is 31.78 kg/(m^2).  Congratulated on weight loss noted. Endorses healthy diet changes.

## 2015-03-26 NOTE — Patient Instructions (Addendum)
Stop diovan. Let's try clonidine 0.1mg  twice daily for blood pressure. Don't suddenly stop this medicine, but taper down if we need to stop it.  Continue to monitor blood pressure at home with this new medication.  Keep physical appointment for May.

## 2015-03-26 NOTE — Progress Notes (Signed)
BP 144/96 mmHg  Pulse 65  Temp(Src) 98 F (36.7 C) (Oral)  Wt 207 lb 8 oz (94.121 kg)  SpO2 98%   CC: discuss meds  Subjective:    Patient ID: Jermaine Harris, male    DOB: 04/28/1961, 54 y.o.   MRN: [id number]  HPI: Jermaine Harris is a 54 y.o. male presenting on 03/26/2015 for Medication Management and Hypertension   HTN - amlodipine, hctz were not effective to control bp. Losartan 100mg  initially effective, then bp again increased. At home, bp consistently 841 systolic. [facility]. H/o cough on ACEI. Denies HA, vision changes, CP/tightness, SOB, leg swelling.   Losing weight, trying. Down 5 lbs in last several months.  Relevant past medical, surgical, family and social history reviewed and updated as indicated. Interim medical history since our last visit reviewed. Allergies and medications reviewed and updated. Current Outpatient Prescriptions on File Prior to Visit  Medication Sig  . ibuprofen (ADVIL,MOTRIN) 200 MG tablet Take 200 mg by mouth as needed.  . valsartan (DIOVAN) 320 MG tablet Take 1 tablet (320 mg total) by mouth daily.  Marland Kitchen venlafaxine XR (EFFEXOR-XR) 75 MG 24 hr capsule TAKE 1 CAPSULE BY MOUTH DAILY WITH BREAKFAST.   No current facility-administered medications on file prior to visit.   Past Medical History  Diagnosis Date  . HTN (hypertension)   . Claustrophobia   . Seasonal allergies   . Irritable bowel syndrome   . Actinic keratosis   . GAD (generalized anxiety disorder)     claustrophobia, some panic attacks    Past Surgical History  Procedure Laterality Date  . Tonsillectomy    . Colonoscopy  2012    mild diverticulosis, rec rpt 5 yrs 2/2 fmhx polyps   Family History  Problem Relation Age of Onset  . Heart disease Father     pacer and CHF  . Transient ischemic attack Mother 26    several  . Depression Mother   . Depression Father   . Colon polyps Sister   . Sudden death Maternal Grandfather 72    ?mustard gas   Review of  Systems Per HPI unless specifically indicated above     Objective:    BP 144/96 mmHg  Pulse 65  Temp(Src) 98 F (36.7 C) (Oral)  Wt 207 lb 8 oz (94.121 kg)  SpO2 98%  Wt Readings from Last 3 Encounters:  03/26/15 207 lb 8 oz (94.121 kg)  12/24/14 212 lb 4 oz (96.276 kg)  11/12/14 214 lb 8 oz (97.297 kg)    Physical Exam  Constitutional: He appears well-developed and well-nourished. No distress.  HENT:  Head: Normocephalic and atraumatic.  Mouth/Throat: Oropharynx is clear and moist. No oropharyngeal exudate.  Neck: Normal range of motion. Neck supple. No thyromegaly present.  Cardiovascular: Normal rate, regular rhythm, normal heart sounds and intact distal pulses.   No murmur heard. Pulmonary/Chest: Effort normal and breath sounds normal. No respiratory distress. He has no wheezes. He has no rales.  Musculoskeletal: He exhibits no edema.  Lymphadenopathy:    He has no cervical adenopathy.  Skin: Skin is warm and dry. No rash noted.  Psychiatric: He has a normal mood and affect.  Nursing note and vitals reviewed.       Assessment & Plan:   Problem List Items Addressed This Visit    Right sided sciatica    Resolved with adding lumbar support to recliner (extra pillow)      Obesity    Body mass  index is 31.78 kg/(m^2).  Congratulated on weight loss noted. Endorses healthy diet changes.      Essential hypertension - Primary    First losartan now diovan not controlling bp - pt endorses same bp readings on and off ARB. Lisinopril caused cough. Amlodipine, hctz ineffective. Will trial clonidine, start at 0.1mg  bid. Discussed rebound hypertension if suddenly stopped. Discussed monitoring heart rate for bradycardia. Pt will return on tuesday for labs and nurse visit to compare home bp with our bp readings. Has CPE appt in 2 wks.      Relevant Medications   cloNIDine (CATAPRES) 0.1 MG tablet       Follow up plan: Return in about 2 months (around 05/26/2015), or if  symptoms worsen or fail to improve, for follow up visit.

## 2015-03-26 NOTE — Assessment & Plan Note (Addendum)
Resolved with adding lumbar support to recliner (extra pillow)

## 2015-03-26 NOTE — Progress Notes (Signed)
Pre visit review using our clinic review tool, if applicable. No additional management support is needed unless otherwise documented below in the visit note. 

## 2015-03-30 ENCOUNTER — Other Ambulatory Visit: Payer: Self-pay | Admitting: Family Medicine

## 2015-03-30 DIAGNOSIS — I10 Essential (primary) hypertension: Secondary | ICD-10-CM

## 2015-03-31 ENCOUNTER — Ambulatory Visit: Payer: BLUE CROSS/BLUE SHIELD

## 2015-03-31 ENCOUNTER — Telehealth: Payer: Self-pay | Admitting: *Deleted

## 2015-03-31 ENCOUNTER — Other Ambulatory Visit (INDEPENDENT_AMBULATORY_CARE_PROVIDER_SITE_OTHER): Payer: BLUE CROSS/BLUE SHIELD

## 2015-03-31 DIAGNOSIS — I10 Essential (primary) hypertension: Secondary | ICD-10-CM

## 2015-03-31 LAB — LIPID PANEL
Cholesterol: 140 mg/dL (ref 0–200)
HDL: 29.9 mg/dL — ABNORMAL LOW (ref >39.00)
LDL Cholesterol: 76 mg/dL (ref 0–99)
NonHDL: 110.1
Total CHOL/HDL Ratio: 5
Triglycerides: 173 mg/dL — ABNORMAL HIGH (ref 0.0–149.0)
VLDL: 34.6 mg/dL (ref 0.0–40.0)

## 2015-03-31 LAB — RENAL FUNCTION PANEL
Albumin: 4.3 g/dL (ref 3.5–5.2)
BUN: 21 mg/dL (ref 6–23)
CO2: 28 mEq/L (ref 19–32)
Calcium: 9 mg/dL (ref 8.4–10.5)
Chloride: 105 mEq/L (ref 96–112)
Creatinine, Ser: 1.26 mg/dL (ref 0.40–1.50)
GFR: 63.15 mL/min (ref >60.00)
Glucose, Bld: 127 mg/dL — ABNORMAL HIGH (ref 70–99)
Phosphorus: 3.6 mg/dL (ref 2.3–4.6)
Potassium: 4.3 mEq/L (ref 3.5–5.1)
Sodium: 139 mEq/L (ref 135–145)

## 2015-03-31 LAB — MICROALBUMIN / CREATININE URINE RATIO
Creatinine,U: 272 mg/dL
Microalb Creat Ratio: 0.4 mg/g (ref 0.0–30.0)
Microalb, Ur: 1.1 mg/dL (ref 0.0–1.9)

## 2015-03-31 NOTE — Telephone Encounter (Signed)
Sounds like clonidine is helping blood pressure great but intolerable side effects. Let's decrease to once daily for a few days then stop - will reassess at physical.

## 2015-03-31 NOTE — Progress Notes (Signed)
Pt came in this am for nurse visit bp check. Per pt his blood pressure reading have been elevated when checked at home, so he brought his monitor to compare results. I checked his bp manually first, then with his digital monitor. Results obtained by both methods were similar. Since he had originally complained of problems with his monitor when he used it, I gave him instructions on how to use ie proper cuff placement, having arm at level of heart, and remaining still. I then had him check bp himself and results were relatively the same 117/76 ( I didn't document this last reading in vital signs.    He stated was unhappy with side effects since starting Clonidine. C/o scratchy throat, cough, difficulty sleeping- restlessness, decreased libido, difficulty maintaining an erection, and inability to ejaculate. He was previously having those sexual issues due to Effexor, but they have worsened with start of Clonidine. Cough and scratchy throat didn't start until he started med.

## 2015-03-31 NOTE — Telephone Encounter (Signed)
Patient notified and verbalized understanding. 

## 2015-03-31 NOTE — Telephone Encounter (Signed)
Pt came in this am for nurse visit bp check. Per pt his blood pressure reading have been elevated when checked at home, so he brought his monitor to compare results. I checked his bp manually first, then with his digital monitor. Results obtained by both methods were similar. Since he had originally complained of problems with his monitor when he used it, I gave him instructions on how to use ie proper cuff placement, having arm at level of heart, and remaining still. I then had him check bp himself and results were relatively the same 117/76 ( I didn't document this last reading in vital signs.    He stated was unhappy with side effects since starting Clonidine. C/o scratchy throat, cough, difficulty sleeping- restlessness, decreased libido, difficulty maintaining an erection, and inability to ejaculate. He was previously having those sexual issues due to Effexor, but they have worsened with start of Clonidine. Cough and scratchy throat didn't start until he started med. Please advise?

## 2015-04-07 ENCOUNTER — Ambulatory Visit (INDEPENDENT_AMBULATORY_CARE_PROVIDER_SITE_OTHER): Payer: BLUE CROSS/BLUE SHIELD | Admitting: Family Medicine

## 2015-04-07 ENCOUNTER — Encounter: Payer: Self-pay | Admitting: Family Medicine

## 2015-04-07 VITALS — BP 134/78 | HR 68 | Temp 98.4°F | Ht 68.25 in | Wt 206.0 lb

## 2015-04-07 DIAGNOSIS — F411 Generalized anxiety disorder: Secondary | ICD-10-CM

## 2015-04-07 DIAGNOSIS — Z0000 Encounter for general adult medical examination without abnormal findings: Secondary | ICD-10-CM | POA: Diagnosis not present

## 2015-04-07 DIAGNOSIS — E669 Obesity, unspecified: Secondary | ICD-10-CM

## 2015-04-07 DIAGNOSIS — I10 Essential (primary) hypertension: Secondary | ICD-10-CM

## 2015-04-07 DIAGNOSIS — Z0001 Encounter for general adult medical examination with abnormal findings: Secondary | ICD-10-CM | POA: Insufficient documentation

## 2015-04-07 MED ORDER — VENLAFAXINE HCL ER 37.5 MG PO CP24: 37.5000 mg | ORAL_CAPSULE | Freq: Every day | ORAL | Status: DC

## 2015-04-07 NOTE — Assessment & Plan Note (Signed)
Interested in lower effexor dose - 37.5mg  XR capsules sent in.

## 2015-04-07 NOTE — Assessment & Plan Note (Signed)
Preventative protocols reviewed and updated unless pt declined. Discussed healthy diet and lifestyle.  

## 2015-04-07 NOTE — Patient Instructions (Addendum)
Try 37.5mg  capsules of effexor.  Work on [employer] and lifestyle changes. Return in 3 months for labwork to recheck sugars. Return as needed or in 1 year for next physical. Keep me updated with blood pressures. Try once daily clonidine.

## 2015-04-07 NOTE — Assessment & Plan Note (Signed)
Body mass index is 31.08 kg/(m^2). discussed healthy diet and lifestyle changes to affect sustainable weight loss.

## 2015-04-07 NOTE — Assessment & Plan Note (Signed)
Chronic, stable. Interested in trial of once daily clonidine - will try this.

## 2015-04-07 NOTE — Progress Notes (Signed)
Pre visit review using our clinic review tool, if applicable. No additional management support is needed unless otherwise documented below in the visit note. 

## 2015-04-07 NOTE — Progress Notes (Signed)
BP 134/78 mmHg  Pulse 68  Temp(Src) 98.4 F (36.9 C) (Oral)  Ht 5' 8.25" (1.734 m)  Wt 206 lb (93.441 kg)  BMI 31.08 kg/m2   CC: CPE  Subjective:    Patient ID: Jermaine Harris, male    DOB: Mar 12, 1961, 54 y.o.   MRN: [id number]  HPI: Jermaine Harris is a 54 y.o. male presenting on 04/07/2015 for Annual Exam   HTN - intolerant to several meds in past, lastest trial clonidine but not tolerating side effects - sedation, decreased libido, and night time cough.  Just started new relationship.   Wonders about stopping effexor - mainly has had longstanding anxiety issues, specially claustrophobia.  Preventative: COLONOSCOPY Date: 2012 mild diverticulosis, rec rpt 5 yrs 2/2 fmhx polyps ([place]) Prostate cancer screening - has had in past normal. Discussed. Would like screening. 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 Interim medical history since our last visit reviewed. Allergies and medications reviewed and updated. Current Outpatient Prescriptions on File Prior to Visit  Medication Sig  . ibuprofen (ADVIL,MOTRIN) 200 MG tablet Take 200 mg by mouth as needed.   No current facility-administered medications on file prior to visit.    Review of Systems  Constitutional: Negative for fever, chills, activity change, appetite change, fatigue and unexpected weight change.  HENT: Negative for hearing loss.   Eyes: Negative for visual disturbance.  Respiratory: Negative for cough, chest tightness, shortness of breath and wheezing.   Cardiovascular: Negative for chest pain, palpitations and leg swelling.  Gastrointestinal: Negative for nausea,  vomiting, abdominal pain, diarrhea, constipation, blood in stool and abdominal distention.  Genitourinary: Negative for hematuria and difficulty urinating.  Musculoskeletal: Negative for myalgias, arthralgias and neck pain.  Skin: Negative for rash.  Neurological: Negative for dizziness, seizures, syncope and headaches.  Hematological: Negative for adenopathy. Does not bruise/bleed easily.  Psychiatric/Behavioral: Negative for dysphoric mood. The patient is nervous/anxious.    Per HPI unless specifically indicated above     Objective:    BP 134/78 mmHg  Pulse 68  Temp(Src) 98.4 F (36.9 C) (Oral)  Ht 5' 8.25" (1.734 m)  Wt 206 lb (93.441 kg)  BMI 31.08 kg/m2  Wt Readings from Last 3 Encounters:  04/07/15 206 lb (93.441 kg)  03/26/15 207 lb 8 oz (94.121 kg)  12/24/14 212 lb 4 oz (96.276 kg)    Physical Exam  Constitutional: He is oriented to person, place, and time. He appears well-developed and well-nourished. No distress.  HENT:  Head: Normocephalic and atraumatic.  Right Ear: Hearing, tympanic membrane, external ear and ear canal normal.  Left Ear: Hearing, tympanic membrane, external ear and ear canal normal.  Nose: Nose normal.  Mouth/Throat: Uvula is midline, oropharynx is clear and moist and mucous membranes are normal. No oropharyngeal exudate, posterior oropharyngeal edema or posterior oropharyngeal erythema.  Eyes: Conjunctivae and EOM are normal. Pupils are equal, round, and reactive to light. No scleral icterus.  Neck: Normal range of motion. Neck supple. No thyromegaly present.  Cardiovascular: Normal rate, regular rhythm, normal heart sounds and intact distal pulses.   No murmur heard. Pulses:      Radial pulses are 2+ on the right side, and 2+ on the left side.  Pulmonary/Chest:  Effort normal and breath sounds normal. No respiratory distress. He has no wheezes. He has no rales.  Abdominal: Soft. Bowel sounds are normal. He exhibits no distension and no mass.  There is no tenderness. There is no rebound and no guarding.  Musculoskeletal: Normal range of motion. He exhibits no edema.  Lymphadenopathy:    He has no cervical adenopathy.  Neurological: He is alert and oriented to person, place, and time.  CN grossly intact, station and gait intact  Skin: Skin is warm and dry. No rash noted.  Psychiatric: He has a normal mood and affect. His behavior is normal. Judgment and thought content normal.  Nursing note and vitals reviewed.  Results for orders placed or performed in visit on 03/31/15  Microalbumin / creatinine urine ratio  Result Value Ref Range   Microalb, Ur 1.1 0.0 - 1.9 mg/dL   Creatinine,U 272.0 mg/dL   Microalb Creat Ratio 0.4 0.0 - 30.0 mg/g  Lipid panel  Result Value Ref Range   Cholesterol 140 0 - 200 mg/dL   Triglycerides 173.0 (H) 0.0 - 149.0 mg/dL   HDL 29.90 (L) >39.00 mg/dL   VLDL 34.6 0.0 - 40.0 mg/dL   LDL Cholesterol 76 0 - 99 mg/dL   Total CHOL/HDL Ratio 5    NonHDL 110.10   Renal function panel  Result Value Ref Range   Sodium 139 135 - 145 mEq/L   Potassium 4.3 3.5 - 5.1 mEq/L   Chloride 105 96 - 112 mEq/L   CO2 28 19 - 32 mEq/L   Calcium 9.0 8.4 - 10.5 mg/dL   Albumin 4.3 3.5 - 5.2 g/dL   BUN 21 6 - 23 mg/dL   Creatinine, Ser 1.26 0.40 - 1.50 mg/dL   Glucose, Bld 127 (H) 70 - 99 mg/dL   Phosphorus 3.6 2.3 - 4.6 mg/dL   GFR 63.15 >60.00 mL/min      Assessment & Plan:   Problem List Items Addressed This Visit    Obesity    Body mass index is 31.08 kg/(m^2). discussed healthy diet and lifestyle changes to affect sustainable weight loss.      Health maintenance examination - Primary    Preventative protocols reviewed and updated unless pt declined. Discussed healthy diet and lifestyle.       GAD (generalized anxiety disorder)    Interested in lower effexor dose - 37.5mg  XR capsules sent in.      Essential hypertension    Chronic, stable. Interested in trial of once daily clonidine - will try  this.      Relevant Medications   cloNIDine (CATAPRES) 0.1 MG tablet       Follow up plan: Return in about 1 year (around 04/06/2016), or as needed, for annual exam, prior fasting for blood work.

## 2015-06-05 DIAGNOSIS — M5126 Other intervertebral disc displacement, lumbar region: Secondary | ICD-10-CM

## 2015-06-05 HISTORY — DX: Other intervertebral disc displacement, lumbar region: M51.26

## 2015-06-23 ENCOUNTER — Encounter: Payer: Self-pay | Admitting: Family Medicine

## 2015-06-23 ENCOUNTER — Telehealth: Payer: Self-pay | Admitting: *Deleted

## 2015-06-23 ENCOUNTER — Ambulatory Visit (INDEPENDENT_AMBULATORY_CARE_PROVIDER_SITE_OTHER): Payer: BLUE CROSS/BLUE SHIELD

## 2015-06-23 ENCOUNTER — Ambulatory Visit (INDEPENDENT_AMBULATORY_CARE_PROVIDER_SITE_OTHER): Payer: BLUE CROSS/BLUE SHIELD | Admitting: Family Medicine

## 2015-06-23 ENCOUNTER — Encounter (INDEPENDENT_AMBULATORY_CARE_PROVIDER_SITE_OTHER): Payer: Self-pay

## 2015-06-23 VITALS — BP 142/84 | HR 68 | Temp 97.7°F | Wt 208.4 lb

## 2015-06-23 DIAGNOSIS — M79605 Pain in left leg: Secondary | ICD-10-CM

## 2015-06-23 DIAGNOSIS — R200 Anesthesia of skin: Secondary | ICD-10-CM | POA: Insufficient documentation

## 2015-06-23 MED ORDER — HYDROCODONE-ACETAMINOPHEN 10-325 MG PO TABS: 1.0000 | ORAL_TABLET | ORAL | Status: DC | PRN

## 2015-06-23 NOTE — Telephone Encounter (Signed)
I will see him tomorrow as planned unless he goes to the ED

## 2015-06-23 NOTE — Telephone Encounter (Signed)
Pt notified of Dr. Marliss Coots comments/ recommendations and verbalized understanding. Pt said he didn't want to go to the ER so he is going to wait until his appt with you tomorrow morning, I did advise pt if sxs get worse over night he needs to go to ER for urgent eval and treatment

## 2015-06-23 NOTE — Telephone Encounter (Signed)
Pt notified of results and Dr. Marliss Coots recommendations.   Pt said the pain meds are not helping the pain at all, he has already scheduled a f/u appt with Dr. Glori Bickers tomorrow to discuss this but wanted to know if he can take 2 tabs of the pain meds for tonight to help until he sees Dr. Glori Bickers in the morning

## 2015-06-23 NOTE — Progress Notes (Signed)
Subjective:    Patient ID: Jermaine Harris, male    DOB: September 13, 1961, 54 y.o.   MRN: [id number]  HPI Pt is here with L leg pain and tingling   Hx of mild lumbar deg change LS film 1/16 CLINICAL DATA: Initial evaluation for right-sided sciatic at for 7 months with no trauma  EXAM: LUMBAR SPINE - COMPLETE 4+ VIEW  COMPARISON: None.  FINDINGS: Mild L5-S1 facet arthropathy bilaterally. Normal alignment. Minimal L3-4 and mild L4-5 and L5-S1 degenerative disc disease. No fracture or other focal bony abnormalities.  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 Health maintenance examination 04/07/2015  . Right sided sciatica 07/03/2014  . Obesity 06/23/2014  . Essential hypertension   . Seasonal allergies   . Irritable bowel syndrome   . GAD (generalized anxiety disorder)    Past Medical History  Diagnosis Date  . HTN (hypertension)   . Claustrophobia   . Seasonal allergies   . Irritable bowel syndrome   . Actinic keratosis   . GAD (generalized anxiety disorder)     claustrophobia, some panic attacks   Past Surgical History  Procedure Laterality Date  . Tonsillectomy    . Colonoscopy  2012    mild diverticulosis, rec rpt 5 yrs 2/2 fmhx polyps   History  Substance Use Topics  . Smoking status: Never Smoker   . Smokeless tobacco: Never Used  . Alcohol Use: 0.0 oz/week    0 Standard drinks or equivalent per week     Comment: couple of drinks per week   Family History  Problem Relation Age of Onset  . Heart disease Father    pacer and CHF  . Transient ischemic attack Mother 34    several  . Depression Mother   . Depression Father   . Colon polyps Sister   . Sudden death Maternal Grandfather 33    ?mustard gas   Allergies  Allergen Reactions  . Ace Inhibitors Cough   Current Outpatient Prescriptions on File Prior to Visit  Medication Sig Dispense Refill  . cloNIDine (CATAPRES) 0.1 MG tablet Take 1 tablet (0.1 mg total) by mouth daily. 60 tablet   . ibuprofen (ADVIL,MOTRIN) 200 MG tablet Take 200 mg by mouth as needed.    . venlafaxine XR (EFFEXOR-XR) 37.5 MG 24 hr capsule Take 1 capsule (37.5 mg total) by mouth daily with breakfast. 90 capsule 3   No current facility-administered medications on file prior to visit.      Review of Systems Review of Systems  Constitutional: Negative for fever, appetite change, fatigue and unexpected weight change.  Eyes: Negative for pain and visual disturbance.  Respiratory: Negative for cough and shortness of breath.   Cardiovascular: Negative for cp or palpitations    Gastrointestinal: Negative for nausea, diarrhea and constipation.  Genitourinary: Negative for urgency and frequency.  Skin: Negative for pallor or rash   MSK pos for leg pain L , neg for back pain  Neurological:  Negative for weakness, light-headedness, and headaches. pos for occ tingling in the foot  Hematological: Negative for adenopathy. Does not bruise/bleed easily.  Psychiatric/Behavioral: Negative for dysphoric mood. The patient is not nervous/anxious.         Objective:   Physical Exam  Constitutional: He appears well-developed and well-nourished. No distress.  HENT:  Head: Normocephalic and atraumatic.  Eyes: Conjunctivae and EOM are normal. Pupils are equal, round, and reactive to light.  Neck: Normal range of motion. Neck supple.  Cardiovascular: Normal rate, regular rhythm, normal heart sounds and intact distal pulses.   Pulmonary/Chest: Effort normal and breath sounds normal. No  respiratory distress. He has no wheezes. He has no rales.  No crackles   Musculoskeletal: He exhibits tenderness.  Mild tenderness in lateral L calf  slt full feeling/unsure if swollen Well perfused  No erythema or warmth No palpable cords  Neg homan's sign Nl gait  No LS tenderness Nl rom  Neg SLR    Lymphadenopathy:    He has no cervical adenopathy.  Neurological: He is alert. He has normal strength and normal reflexes. He displays no atrophy. No cranial nerve deficit or sensory deficit. He exhibits normal muscle tone. Coordination and gait normal.  Skin: Skin is warm and dry. No rash noted. No erythema. No pallor.  Psychiatric: He has a normal mood and affect.          Assessment & Plan:   Problem List Items Addressed This Visit    Left leg pain - Primary    [place] out [place]  Pain in lateral L calf  Hx of sciatica on the other side -last LS xray rev 1/16- showed mild deg change ? Pos slt swelling and minimal tenderness Send for venous doppler to r/o DVT now       Relevant Orders   VAS [place] LOWER EXTREMITY VENOUS (DVT) (Completed)

## 2015-06-23 NOTE — Progress Notes (Signed)
Pre visit review using our clinic review tool, if applicable. No additional management support is needed unless otherwise documented below in the visit note. 

## 2015-06-23 NOTE — Patient Instructions (Signed)
Stop at check out for ref for doppler of leg  Try the norco with caution of sedation and habit  Stool softener as needed with that

## 2015-06-23 NOTE — Telephone Encounter (Signed)
Good!- please let pt know no blood clot was seen- very re assuring  Try the pain medication and update me with how he is feeling tomorrow please  I will wait to review the official report  Then we will make a plan for what to do next

## 2015-06-23 NOTE — Telephone Encounter (Signed)
I cannot ok 2 pills - this is already the higher strength hydrocodone and I worry about side effects at a higher dose of narcotic. (in pt who does not usually take them)  My plan tomorrow is to review report when it returns - possibly get some xrays and consider an MRI  If his pain is that severe - he may need to go to the ED for more urgent eval and treatment- he may want to do that   Let me know what he decides

## 2015-06-23 NOTE — Telephone Encounter (Signed)
Jermaine Harris with Imaging called to let us know that pt was negative for any DVT, they will let pt go and advise him we will call him once the results are finalized

## 2015-06-24 ENCOUNTER — Encounter: Payer: Self-pay | Admitting: Family Medicine

## 2015-06-24 ENCOUNTER — Ambulatory Visit (INDEPENDENT_AMBULATORY_CARE_PROVIDER_SITE_OTHER)
Admission: RE | Admit: 2015-06-24 | Discharge: 2015-06-24 | Disposition: A | Discharge status: Home / Self Care | Payer: BLUE CROSS/BLUE SHIELD | Source: Ambulatory Visit | Attending: Family Medicine | Admitting: Family Medicine

## 2015-06-24 ENCOUNTER — Ambulatory Visit (INDEPENDENT_AMBULATORY_CARE_PROVIDER_SITE_OTHER): Payer: BLUE CROSS/BLUE SHIELD | Admitting: Family Medicine

## 2015-06-24 VITALS — BP 136/82 | HR 72 | Temp 97.6°F | Ht 68.25 in | Wt 206.2 lb

## 2015-06-24 DIAGNOSIS — M79605 Pain in left leg: Secondary | ICD-10-CM

## 2015-06-24 MED ORDER — PREDNISONE 10 MG PO TABS: ORAL_TABLET | ORAL | Status: DC

## 2015-06-24 MED ORDER — METHYLPREDNISOLONE ACETATE 40 MG/ML IJ SUSP
40.0000 mg | Freq: Once | INTRAMUSCULAR | Status: AC
  Administered 2015-06-24: 40 mg via INTRAMUSCULAR

## 2015-06-24 NOTE — Progress Notes (Signed)
Pre visit review using our clinic review tool, if applicable. No additional management support is needed unless otherwise documented below in the visit note. 

## 2015-06-24 NOTE — Progress Notes (Signed)
Subjective:    Patient ID: Jermaine Harris, male    DOB: 1961-05-24, 54 y.o.   MRN: [id number]  HPI Here for f/u of leg pain   Is a "touch" better   Took the norco 10 mg for pain control  Disoriented him - does not want to take it again (not worth the limited pain relief)    Doppler yesterday afternoon  Call report neg for DVT   Wants to try a steroid - for poss sciatica  Is a little desensitized to the touch   Patient Active Problem List   Diagnosis Date Noted  . Left leg pain 06/23/2015  . Health maintenance examination 04/07/2015  . Right sided sciatica 07/03/2014  . Obesity 06/23/2014  . Essential hypertension   . Seasonal allergies   . Irritable bowel syndrome   . GAD (generalized anxiety disorder)    Past Medical History  Diagnosis Date  . HTN (hypertension)   . Claustrophobia   . Seasonal allergies   . Irritable bowel syndrome   . Actinic keratosis   . GAD (generalized anxiety disorder)     claustrophobia, some panic attacks   Past Surgical History  Procedure Laterality Date  . Tonsillectomy    . Colonoscopy  2012    mild diverticulosis, rec rpt 5 yrs 2/2 fmhx polyps   History  Substance Use Topics  . Smoking status: Never Smoker   . Smokeless tobacco: Never Used  . Alcohol Use: 0.0 oz/week    0 Standard drinks or equivalent per week     Comment: couple of drinks per week   Family History  Problem Relation Age of Onset  . Heart disease Father     pacer and CHF  . Transient ischemic attack Mother 43    several  . Depression Mother   . Depression Father   . Colon polyps Sister   . Sudden death Maternal Grandfather 28    ?mustard gas   Allergies  Allergen Reactions  . Ace Inhibitors Cough   Current Outpatient Prescriptions on File Prior to Visit  Medication Sig Dispense Refill  . cloNIDine (CATAPRES) 0.1 MG tablet Take 1 tablet (0.1 mg total) by mouth daily. 60 tablet   . CODEINE PHOSPHATE PO Take 15 mg by mouth.    . diazepam (VALIUM) 2  MG tablet Take 2 mg by mouth every 6 (six) hours as needed for anxiety.    Marland Kitchen HYDROcodone-acetaminophen (NORCO) 10-325 MG per tablet Take 1 tablet by mouth every 4 (four) hours as needed for moderate pain or severe pain (watch out for sedation , also habit forming). 30 tablet 0  . ibuprofen (ADVIL,MOTRIN) 200 MG tablet Take 200 mg by mouth as needed.    . naproxen (NAPROSYN) 500 MG tablet Take 500 mg by mouth 2 (two) times daily with a meal.    . omeprazole (PRILOSEC) 20 MG capsule Take 20 mg by mouth daily.    Marland Kitchen venlafaxine XR (EFFEXOR-XR) 37.5 MG 24 hr capsule Take 1 capsule (37.5 mg total) by mouth daily with breakfast. 90 capsule 3   No current facility-administered medications on file prior to visit.     Review of Systems Review of Systems  Constitutional: Negative for fever, appetite change, fatigue and unexpected weight change.  Eyes: Negative for pain and visual disturbance.  Respiratory: Negative for cough and shortness of breath.   Cardiovascular: Negative for cp or palpitations    Gastrointestinal: Negative for nausea, diarrhea and constipation.  Genitourinary: Negative for urgency and  frequency.  Skin: Negative for pallor or rash   MSK pos for L leg pain, neg for back pain  Neurological: Negative for weakness, light-headedness,  and headaches. pos for mild tingling of foot L Hematological: Negative for adenopathy. Does not bruise/bleed easily.  Psychiatric/Behavioral: Negative for dysphoric mood. The patient is not nervous/anxious.         Objective:   Physical Exam  Constitutional: He appears well-developed and well-nourished. No distress.  Eyes: Conjunctivae and EOM are normal. Pupils are equal, round, and reactive to light. No scleral icterus.  Cardiovascular: Normal rate and regular rhythm.   Pulmonary/Chest: Effort normal and breath sounds normal.  Musculoskeletal: He exhibits tenderness. He exhibits no edema.  Mild tenderness of L lateral calf without swelling or  skin change Nl rom LS/hip/ankle Well perf  No neurol def   Nl gait - though pt c/o pain   Neurological: He is alert. He has normal strength and normal reflexes. He displays no atrophy. No cranial nerve deficit or sensory deficit. He exhibits normal muscle tone. Coordination normal.  Skin: Skin is warm and dry. No rash noted. No erythema. No pallor.  Psychiatric: He has a normal mood and affect.          Assessment & Plan:   Problem List Items Addressed This Visit    Left leg pain - Primary    Neg venous doppler for  Check tib/fib film today slt improved today No change in exam Disc poss of sciatica  Trial of steroids - depo medrol 40 IM now followed by prednisone taper  Will likely need MRI  Will update in several days  Will use caution with norco- try 1/2 pill if needed       Relevant Medications   methylPREDNISolone acetate (DEPO-MEDROL) injection 40 mg (Completed)   Other Relevant Orders   DG Tibia/Fibula Left (Completed)

## 2015-06-24 NOTE — Patient Instructions (Signed)
Xray of leg today  I still wonder about sciatica  Shot of steroid now  Then start prednisone as ordered tomorrow Try 1/2 pain pill if needed (with caution)   Let me know how you are doing in several days and we will decide if you need an MRI  If new symptoms (rash/weakness/numbness/fever)- let me know immediately If sudden more severe pain please go to ED

## 2015-06-25 ENCOUNTER — Encounter: Payer: Self-pay | Admitting: Family Medicine

## 2015-06-25 NOTE — Assessment & Plan Note (Signed)
[place]  Pain in lateral L calf  Hx of sciatica on the other side -last LS xray rev 1/16- showed mild deg change ? Pos slt swelling and minimal tenderness Send for venous doppler to r/o DVT now

## 2015-06-25 NOTE — Assessment & Plan Note (Signed)
Neg venous doppler for  Check tib/fib film today slt improved today No change in exam Disc poss of sciatica  Trial of steroids - depo medrol 40 IM now followed by prednisone taper  Will likely need MRI  Will update in several days  Will use caution with norco- try 1/2 pill if needed

## 2015-06-27 ENCOUNTER — Encounter: Payer: Self-pay | Admitting: Family Medicine

## 2015-06-28 ENCOUNTER — Telehealth: Payer: Self-pay | Admitting: Family Medicine

## 2015-06-28 DIAGNOSIS — M79605 Pain in left leg: Secondary | ICD-10-CM

## 2015-06-28 NOTE — Telephone Encounter (Signed)
Pt's leg pain is worsening again  I will order a lumbar MRI urgently and route to [facility]

## 2015-06-29 ENCOUNTER — Ambulatory Visit
Admission: RE | Admit: 2015-06-29 | Discharge: 2015-06-29 | Disposition: A | Discharge status: Home / Self Care | Payer: BLUE CROSS/BLUE SHIELD | Source: Ambulatory Visit | Attending: Family Medicine | Admitting: Family Medicine

## 2015-06-29 ENCOUNTER — Encounter: Payer: Self-pay | Admitting: Family Medicine

## 2015-06-29 DIAGNOSIS — R202 Paresthesia of skin: Secondary | ICD-10-CM | POA: Diagnosis present

## 2015-06-29 DIAGNOSIS — M79605 Pain in left leg: Secondary | ICD-10-CM | POA: Diagnosis present

## 2015-06-29 DIAGNOSIS — M5186 Other intervertebral disc disorders, lumbar region: Secondary | ICD-10-CM | POA: Diagnosis not present

## 2015-06-29 DIAGNOSIS — M5126 Other intervertebral disc displacement, lumbar region: Secondary | ICD-10-CM | POA: Insufficient documentation

## 2015-06-29 DIAGNOSIS — M4807 Spinal stenosis, lumbosacral region: Secondary | ICD-10-CM | POA: Insufficient documentation

## 2015-06-29 NOTE — Telephone Encounter (Signed)
Scheduled 3:15pm today 06/29/15 at [facility]

## 2015-06-30 ENCOUNTER — Telehealth: Payer: Self-pay | Admitting: Family Medicine

## 2015-06-30 DIAGNOSIS — M5137 Other intervertebral disc degeneration, lumbosacral region: Secondary | ICD-10-CM | POA: Insufficient documentation

## 2015-06-30 DIAGNOSIS — M79605 Pain in left leg: Secondary | ICD-10-CM

## 2015-06-30 NOTE — Telephone Encounter (Signed)
Pt needs ortho ref for lumbar disc dz with radiculopathy and mod to severe leg pain  I will order it as urgent and route to [facility]

## 2015-07-01 NOTE — Telephone Encounter (Signed)
Appt made with Dr Tonita Cong at [place] for today, 07/01/15

## 2015-07-06 HISTORY — PX: MICRODISCECTOMY LUMBAR: SUR864

## 2015-07-08 ENCOUNTER — Encounter: Payer: Self-pay | Admitting: Family Medicine

## 2015-07-08 DIAGNOSIS — M5431 Sciatica, right side: Secondary | ICD-10-CM

## 2015-07-08 DIAGNOSIS — M5137 Other intervertebral disc degeneration, lumbosacral region: Secondary | ICD-10-CM

## 2015-07-08 NOTE — Telephone Encounter (Signed)
Referral placed to nsg Dr Cyndy Freeze.

## 2015-07-09 ENCOUNTER — Ambulatory Visit: Payer: Self-pay | Admitting: Orthopedic Surgery

## 2015-07-09 NOTE — Progress Notes (Signed)
Need orders in [facility] on 07/22/15.  Thank You.

## 2015-07-09 NOTE — H&P (Signed)
Jermaine Harris DOB: Jun 20, 1961 Divorced / Language: [name] / Race: White Male  Chief complaint: L leg weakness, numbness, pain  History of Present Illness  The patient is a 54 year old male who presents today for follow up of their back. The patient is being followed for their low back symptoms. They are now year(s) out from when symptoms began. Symptoms reported today include: foot pain (tingling in the top of the left foot). The patient states that they are doing well. Current treatment includes: physical therapy (with [facility]), relative rest, activity modification and NSAIDs. The following medication has been used for pain control: antiinflammatory medication (Aleve). The patient presents today following physical therapy. Note for "Follow-up back": Jermaine Harris follows up for his back and L leg. He is about 2.5 weeks out from onset of the acute episode. He denies back pain. He is still having some residual mild pain in the left lateral lower leg and tingling over the dorsal foot. He has been working with PT. He has been monitoring his weakness, has been heel walking and denies foot drop. He does not think the EHL weakness has gotten any worse and thinks it may be a little bit better. He is taking Aleve up to 2 BID for pain as needed. He is curious about repeating the dosepak and has also asked about seeing a neurosurgeon as an option. 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  Allergies  No Known Drug Allergies07/27/2016  Family History Cerebrovascular Accident Mother. Congestive Heart Failure Father. Heart Disease Father.  Social History  Tobacco use Never smoker. 07/01/2015  Medication History CloNIDine HCl (0.1MG  Tablet, Oral) Active. Hydrocodone-Acetaminophen (10-325MG  Tablet, Oral) Active. Effexor (25MG  Tablet, Oral) Active. PredniSONE (Pak) (5MG  Tablet, Oral) Active. Medications Reconciled  Review of Systems  General Not Present-  Fever and Weight Loss. Skin Not Present- Rash. Cardiovascular Not Present- Chest Pain and Shortness of Breath. Male Genitourinary Not Present- Painful Urination. Musculoskeletal Present- Muscle Pain and Muscle Weakness. Not Present- Joint Swelling. Neurological Present- Numbness. Not Present- Difficulty with balance, Loss of bladder control, Loss of bowel control and Weakness. Endocrine Not Present- Excessive Thirst and Excessive Urination. Hematology Not Present- Easy Bruising.  Physical Exam  General Mental Status -Alert. General Appearance-pleasant, appears comfortable, Not in acute distress. Orientation-Oriented X3. Build & Nutrition-Well nourished and Well developed. Gait-Unassisted and unimpaired.  Abdomen Palpation/Percussion Tenderness - Abdomen is non-tender to palpation. Rigidity (guarding) - Abdomen is soft.  Peripheral Vascular Lower Extremity Palpation - Homan's sign - Bilateral - Negative (normal). Posterior tibial pulse - Bilateral - 2+. Dorsalis pedis pulse - Bilateral - 2+.  Neurologic Motor Strength - Hip Flexion - Bilateral - 5/5. Quadriceps - Bilateral - 5/5. Hamstrings - Bilateral - 5/5. Ankle Dorsiflexion - Bilateral - 5/5. Ankle Plantarflexion - Bilateral - 5/5. Extensor Hallucis Longus - Left - 4/5(4+/5). Right - 5/5. Sensation Lower Extremity - Bilateral - sensation is intact to light touch in the lower extremity. Reflexes Patellar Reflex - Bilateral - 2+. Achilles Reflex - Bilateral - 2+. Babinski - Bilateral - Babinski not present. Clonus - Bilateral - clonus not present.  Musculoskeletal Spine/Ribs/Pelvis  Lumbosacral Spine: Inspection and Palpation - Tenderness - left buttock is tender to palpation(minimal), no tenderness to palpation of the lumbar spinous processes, no tenderness to palpation of the left flank, no tenderness to palpation of right flank, no tenderness to palpation of left lumbar paraspinals, no tenderness to palpation of  right lumbar paraspinals, no  tenderness to palpation of the right buttock, no tenderness to palpation of the left greater trochanter, no tenderness to palpation of the right greater trochanter. Swelling - none. Surrounding tissue tension/texture is - soft. Sensation - normal. Other characteristics - no ecchymosis, no abnormal warmth, no erythema, no evidence of cellulitis. Lumbosacral Spine - ROM - full, painless flexion, extension, lateral bending and rotation. Special Testing - Lumbar - Left seated straight leg raise positive(produces buttock/leg tightness), Right seated straight leg raise negative. Lower Extremity  Right Lower Extremity: Right Hip - ROM - Full ROM of the hip and pain-free. Right Knee - ROM - Full ROM of the knee and pain-free. Right Ankle - ROM - Full and pain-free ROM of the ankle. Left Lower Extremity: Left Hip - ROM - Full ROM of the hip and pain-free. Left Knee - ROM - Full ROM of the knee and pain-free. Left Ankle - ROM - Full and pain-free ROM of the ankle.  Lymphatic General Lymphatics Description - No Localized lymphadenopathy.  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  X-rays three-view demonstrates significant disk degeneration at 4-5 and 5-1 neural foraminal stenosis.  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 We discussed relevant anatomy and etiology of his symptoms in detail, reviewed MRI. Discussed tx options at this point. As per Dr. Reather Littler last note, would recommend proceeding with scheduling lumbar decompression L4-5 to avoid permanent neurologic deficit/weakness. Discussed the procedure itself in detail as well as risks, complications, and alternatives, including but not limited to DVT, PE, infx, bleeding,  failure of procedure, need for secondary procedure, nerve injury, ongoing pain/symptoms, dural tear, CSF leak, and anesthesia risk, even stroke or death. Also discussed typical post-op course as well as spine precautions: no lifting, bending, twisting, prolonged sitting x 6 weeks post-op. Discussed walking program, PT, gentle stretching post-op. All questions were answered. We will proceed with scheduling for the week of 8/15 if at all possible. I have placed him on a repeat dosepak in the interim and will have him return to see Dr. Tonita Cong next week for a recheck. If his EHL weakness does improve with a second dosepak we discussed the possibility of cancelling surgery, but at this point he should proceed with scheduling. Discussed the importance of addressing this within a 6 week time frame from onset to avoid permenent nerve damage, he understands. He is certainly welcome to persue a second opinion in the interim with a neurosurgeon which he did ask me about today. He was given a surgical handout to review on his own. He will continue with PT until they recommend D/C to HEP. We will proceed accordingly with scheduling. I had an extensive discussion of the risks and benefits of the lumbar decompression with the patient including bleeding, infection, damage to neurovascular structures, epidural fibrosis, CSF leak requiring repair. We also discussed increase in pain, adjacent segment disease, recurrent disc herniation, need for future surgery including repeat decompression and/or fusion. We also discussed risks of postoperative hematoma, paralysis, anesthetic complications including DVT, PE, death, cardiopulmonary dysfunction. In addition, the perioperative and postoperative courses were discussed in detail including the rehabilitative time and return to functional activity and work. I provided the patient with an illustrated handout and utilized the appropriate surgical models.  Plan microlumbar decompression  L4-5  Signed electronically by Cecilie Kicks, PA-C for Dr. Tonita Cong

## 2015-07-17 ENCOUNTER — Inpatient Hospital Stay (HOSPITAL_COMMUNITY): Admission: RE | Admit: 2015-07-17 | Payer: BLUE CROSS/BLUE SHIELD | Source: Ambulatory Visit

## 2015-07-22 ENCOUNTER — Ambulatory Visit (HOSPITAL_COMMUNITY): Admission: RE | Admit: 2015-07-22 | Payer: BLUE CROSS/BLUE SHIELD | Source: Ambulatory Visit | Admitting: Specialist

## 2015-07-22 ENCOUNTER — Encounter (HOSPITAL_COMMUNITY): Admission: RE | Payer: Self-pay | Source: Ambulatory Visit

## 2015-07-22 SURGERY — LUMBAR LAMINECTOMY/DECOMPRESSION MICRODISCECTOMY 1 LEVEL
Anesthesia: General

## 2015-09-10 ENCOUNTER — Other Ambulatory Visit: Payer: Self-pay | Admitting: Neurosurgery

## 2015-09-10 DIAGNOSIS — M5126 Other intervertebral disc displacement, lumbar region: Secondary | ICD-10-CM

## 2015-09-14 ENCOUNTER — Ambulatory Visit
Admission: RE | Admit: 2015-09-14 | Discharge: 2015-09-14 | Disposition: A | Discharge status: Home / Self Care | Payer: BLUE CROSS/BLUE SHIELD | Source: Ambulatory Visit | Attending: Neurosurgery | Admitting: Neurosurgery

## 2015-09-14 DIAGNOSIS — M4806 Spinal stenosis, lumbar region: Secondary | ICD-10-CM | POA: Insufficient documentation

## 2015-09-14 DIAGNOSIS — M5126 Other intervertebral disc displacement, lumbar region: Secondary | ICD-10-CM | POA: Insufficient documentation

## 2015-09-14 MED ORDER — GADOBENATE DIMEGLUMINE 529 MG/ML IV SOLN
20.0000 mL | Freq: Once | INTRAVENOUS | Status: AC | PRN
  Administered 2015-09-14: 20 mL via INTRAVENOUS

## 2015-09-18 ENCOUNTER — Ambulatory Visit: Payer: BLUE CROSS/BLUE SHIELD | Admitting: Family Medicine

## 2015-09-21 ENCOUNTER — Ambulatory Visit (INDEPENDENT_AMBULATORY_CARE_PROVIDER_SITE_OTHER): Payer: BLUE CROSS/BLUE SHIELD | Admitting: Family Medicine

## 2015-09-21 ENCOUNTER — Encounter: Payer: Self-pay | Admitting: Family Medicine

## 2015-09-21 VITALS — BP 146/104 | HR 72 | Temp 97.7°F | Wt 211.8 lb

## 2015-09-21 DIAGNOSIS — N528 Other male erectile dysfunction: Secondary | ICD-10-CM

## 2015-09-21 DIAGNOSIS — R208 Other disturbances of skin sensation: Secondary | ICD-10-CM | POA: Diagnosis not present

## 2015-09-21 DIAGNOSIS — N529 Male erectile dysfunction, unspecified: Secondary | ICD-10-CM | POA: Insufficient documentation

## 2015-09-21 DIAGNOSIS — I10 Essential (primary) hypertension: Secondary | ICD-10-CM | POA: Diagnosis not present

## 2015-09-21 DIAGNOSIS — R200 Anesthesia of skin: Secondary | ICD-10-CM

## 2015-09-21 MED ORDER — SILDENAFIL CITRATE 100 MG PO TABS: 50.0000 mg | ORAL_TABLET | Freq: Every day | ORAL | Status: DC | PRN

## 2015-09-21 MED ORDER — VALSARTAN 80 MG PO TABS: 80.0000 mg | ORAL_TABLET | Freq: Every day | ORAL | Status: DC

## 2015-09-21 NOTE — Progress Notes (Signed)
Pre visit review using our clinic review tool, if applicable. No additional management support is needed unless otherwise documented below in the visit note. 

## 2015-09-21 NOTE — Patient Instructions (Addendum)
Start b complex vitamin daily. Trial viagra 100mg  1/2-1 tab.  Decrease clonidine to 1 tablet daily for 1 week, then stop.  Start valsartan 80mg  daily - call us with update in 2 weeks. Return for labs in 10 days after starting valsartan.

## 2015-09-21 NOTE — Progress Notes (Signed)
BP 146/104 mmHg  Pulse 72  Temp(Src) 97.7 F (36.5 C) (Oral)  Wt 211 lb 12 oz (96.049 kg)   CC: check blood pressure  Subjective:    Patient ID: Jermaine Harris, male    DOB: 1961/07/20, 54 y.o.   MRN: [id number]  HPI: Jermaine Harris is a 54 y.o. male presenting on 09/21/2015 for Hypertension and Eye Problem   HTN - Compliant with current antihypertensive regimen of clonidine 0.1mg  daily - increased to BID months ago. Intolerant to several other meds in the past including ACEI induced cough. Amlodipine and hctz did not help control BP. Losartan also ineffective. Does check blood pressures at home. None normal. No low blood pressure readings or symptoms of dizziness/syncope.  Denies HA, vision changes, CP/tightness, SOB, leg swelling. No episodes of tachypalpitations.  Lab Results  Component Value Date   CREATININE 1.26 03/31/2015     07/2015 - microdiscectomy surgery. Persistent L leg numbness/weakness.   Worsening sexual dysfunction since surgery - lost sensitivity. viagra has worked well. Requests refill.   Relevant past medical, surgical, family and social history reviewed and updated as indicated. Interim medical history since our last visit reviewed. Allergies and medications reviewed and updated. Current Outpatient Prescriptions on File Prior to Visit  Medication Sig  . ibuprofen (ADVIL,MOTRIN) 200 MG tablet Take 200 mg by mouth as needed.   No current facility-administered medications on file prior to visit.    Review of Systems Per HPI unless specifically indicated above     Objective:    BP 146/104 mmHg  Pulse 72  Temp(Src) 97.7 F (36.5 C) (Oral)  Wt 211 lb 12 oz (96.049 kg)  Wt Readings from Last 3 Encounters:  09/21/15 211 lb 12 oz (96.049 kg)  06/29/15 206 lb (93.441 kg)  06/24/15 206 lb 4 oz (93.554 kg)   Body mass index is 31.94 kg/(m^2).   Physical Exam  Constitutional: He appears well-developed and well-nourished. No distress.  HENT:  Head:  Normocephalic and atraumatic.  Mouth/Throat: Oropharynx is clear and moist. No oropharyngeal exudate.  Eyes: Conjunctivae and EOM are normal. Pupils are equal, round, and reactive to light. No scleral icterus.  Neck: Normal range of motion. Neck supple. Carotid bruit is not present.  Cardiovascular: Normal rate, regular rhythm, normal heart sounds and intact distal pulses.   No murmur heard. Pulmonary/Chest: Breath sounds normal. No respiratory distress. He has no wheezes. He has no rales.  Abdominal: Soft. There is no tenderness.  No abd/renal bruits  Skin: Skin is warm and dry. No rash noted.  Vitals reviewed.  Results for orders placed or performed in visit on 03/31/15  Microalbumin / creatinine urine ratio  Result Value Ref Range   Microalb, Ur 1.1 0.0 - 1.9 mg/dL   Creatinine,U 272.0 mg/dL   Microalb Creat Ratio 0.4 0.0 - 30.0 mg/g  Lipid panel  Result Value Ref Range   Cholesterol 140 0 - 200 mg/dL   Triglycerides 173.0 (H) 0.0 - 149.0 mg/dL   HDL 29.90 (L) >39.00 mg/dL   VLDL 34.6 0.0 - 40.0 mg/dL   LDL Cholesterol 76 0 - 99 mg/dL   Total CHOL/HDL Ratio 5    NonHDL 110.10   Renal function panel  Result Value Ref Range   Sodium 139 135 - 145 mEq/L   Potassium 4.3 3.5 - 5.1 mEq/L   Chloride 105 96 - 112 mEq/L   CO2 28 19 - 32 mEq/L   Calcium 9.0 8.4 - 10.5 mg/dL   Albumin 4.3  3.5 - 5.2 g/dL   BUN 21 6 - 23 mg/dL   Creatinine, Ser 1.26 0.40 - 1.50 mg/dL   Glucose, Bld 127 (H) 70 - 99 mg/dL   Phosphorus 3.6 2.3 - 4.6 mg/dL   GFR 63.15 >60.00 mL/min      Assessment & Plan:   Problem List Items Addressed This Visit    Left leg numbness    Discussed residual L leg numbness after microdiscectomy. rec start B complex multivitamin for nerve health.      Essential hypertension - Primary    Transition off clonidine, start valsartan 80mg  daily, return in 10 d for Cr check, pt will update me with effect in 2 wks with option to increase valsartan dose. Pt agrees with plan.  No sxs of secondary cause.      Relevant Medications   valsartan (DIOVAN) 80 MG tablet   sildenafil (VIAGRA) 100 MG tablet   Other Relevant Orders   Basic metabolic panel   Erectile dysfunction    Anticipate multifactorial - BP med related + post-surgical Pt interested in viagra - worked well in past (although didn't return sensation). Coupon provided today. Discussed side effects to monitor.          Follow up plan: Return in about 6 weeks (around 11/02/2015), or as needed, for follow up visit.

## 2015-09-21 NOTE — Assessment & Plan Note (Signed)
Discussed residual L leg numbness after microdiscectomy. rec start B complex multivitamin for nerve health.

## 2015-09-21 NOTE — Assessment & Plan Note (Signed)
Transition off clonidine, start valsartan 80mg  daily, return in 10 d for Cr check, pt will update me with effect in 2 wks with option to increase valsartan dose. Pt agrees with plan. No sxs of secondary cause.

## 2015-09-21 NOTE — Assessment & Plan Note (Signed)
Anticipate multifactorial - BP med related + post-surgical Pt interested in viagra - worked well in past (although didn't return sensation). Coupon provided today. Discussed side effects to monitor.

## 2015-10-06 ENCOUNTER — Other Ambulatory Visit: Payer: BLUE CROSS/BLUE SHIELD

## 2015-10-08 ENCOUNTER — Other Ambulatory Visit (INDEPENDENT_AMBULATORY_CARE_PROVIDER_SITE_OTHER): Payer: BLUE CROSS/BLUE SHIELD

## 2015-10-08 ENCOUNTER — Other Ambulatory Visit: Payer: BLUE CROSS/BLUE SHIELD

## 2015-10-08 DIAGNOSIS — I10 Essential (primary) hypertension: Secondary | ICD-10-CM

## 2015-10-09 LAB — BASIC METABOLIC PANEL
BUN: 21 mg/dL (ref 6–23)
CO2: 28 mEq/L (ref 19–32)
Calcium: 9.6 mg/dL (ref 8.4–10.5)
Chloride: 105 mEq/L (ref 96–112)
Creatinine, Ser: 1.18 mg/dL (ref 0.40–1.50)
GFR: 67.98 mL/min (ref >60.00)
Glucose, Bld: 104 mg/dL — ABNORMAL HIGH (ref 70–99)
Potassium: 4.4 mEq/L (ref 3.5–5.1)
Sodium: 140 mEq/L (ref 135–145)

## 2015-10-15 ENCOUNTER — Encounter: Payer: Self-pay | Admitting: Family Medicine

## 2015-10-15 MED ORDER — VALSARTAN 160 MG PO TABS: 160.0000 mg | ORAL_TABLET | Freq: Every day | ORAL | Status: DC

## 2015-11-17 ENCOUNTER — Encounter: Payer: Self-pay | Admitting: Family Medicine

## 2015-11-17 DIAGNOSIS — I10 Essential (primary) hypertension: Secondary | ICD-10-CM

## 2015-11-18 MED ORDER — VALSARTAN 320 MG PO TABS: 320.0000 mg | ORAL_TABLET | Freq: Every day | ORAL | Status: DC

## 2015-12-10 ENCOUNTER — Other Ambulatory Visit (INDEPENDENT_AMBULATORY_CARE_PROVIDER_SITE_OTHER): Payer: BLUE CROSS/BLUE SHIELD

## 2015-12-10 DIAGNOSIS — I10 Essential (primary) hypertension: Secondary | ICD-10-CM | POA: Diagnosis not present

## 2015-12-10 LAB — BASIC METABOLIC PANEL
BUN: 25 mg/dL — ABNORMAL HIGH (ref 6–23)
CO2: 28 mEq/L (ref 19–32)
Calcium: 9.7 mg/dL (ref 8.4–10.5)
Chloride: 107 mEq/L (ref 96–112)
Creatinine, Ser: 1.13 mg/dL (ref 0.40–1.50)
GFR: 71.42 mL/min (ref >60.00)
Glucose, Bld: 107 mg/dL — ABNORMAL HIGH (ref 70–99)
Potassium: 4.5 mEq/L (ref 3.5–5.1)
Sodium: 140 mEq/L (ref 135–145)

## 2015-12-14 ENCOUNTER — Ambulatory Visit: Payer: BLUE CROSS/BLUE SHIELD | Admitting: Family Medicine

## 2015-12-15 ENCOUNTER — Encounter: Payer: Self-pay | Admitting: Family Medicine

## 2015-12-15 ENCOUNTER — Ambulatory Visit (INDEPENDENT_AMBULATORY_CARE_PROVIDER_SITE_OTHER): Payer: BLUE CROSS/BLUE SHIELD | Admitting: Family Medicine

## 2015-12-15 ENCOUNTER — Ambulatory Visit: Payer: BLUE CROSS/BLUE SHIELD | Admitting: Family Medicine

## 2015-12-15 VITALS — BP 144/94 | HR 72 | Temp 97.9°F | Wt 217.2 lb

## 2015-12-15 DIAGNOSIS — I10 Essential (primary) hypertension: Secondary | ICD-10-CM | POA: Diagnosis not present

## 2015-12-15 DIAGNOSIS — R200 Anesthesia of skin: Secondary | ICD-10-CM

## 2015-12-15 DIAGNOSIS — F411 Generalized anxiety disorder: Secondary | ICD-10-CM

## 2015-12-15 DIAGNOSIS — M79661 Pain in right lower leg: Secondary | ICD-10-CM

## 2015-12-15 DIAGNOSIS — R208 Other disturbances of skin sensation: Secondary | ICD-10-CM

## 2015-12-15 DIAGNOSIS — E669 Obesity, unspecified: Secondary | ICD-10-CM

## 2015-12-15 MED ORDER — PREDNISONE 20 MG PO TABS: ORAL_TABLET | ORAL | Status: DC

## 2015-12-15 MED ORDER — VALSARTAN-HYDROCHLOROTHIAZIDE 320-12.5 MG PO TABS: 1.0000 | ORAL_TABLET | Freq: Every day | ORAL | Status: DC

## 2015-12-15 NOTE — Assessment & Plan Note (Signed)
Good pulses, doubt arterial claudication. Not consistent with DVT. Reviewed recent MRIs - initial one 06/2015 mentioned prominent disc protrusion at L4/5 leading to R>L mild subarticular narrowing and foraminal narrowing. Pt worried pain stemming from this which is possible. Will treat with prednisone course in anticipation of upcoming long plane ride/trip. Discussed side effects of prednisone use.

## 2015-12-15 NOTE — Assessment & Plan Note (Signed)
Residual despite microdiscectomy 07/2015 Continue B complex vitamin.

## 2015-12-15 NOTE — Progress Notes (Signed)
Pre visit review using our clinic review tool, if applicable. No additional management support is needed unless otherwise documented below in the visit note. 

## 2015-12-15 NOTE — Patient Instructions (Addendum)
Start combo pill instead of plain valsartan - take diovan hct (320/12.5mg ) sent to pharmacy. Start this when you come back.  For leg - pulses are ok. Treat with steroid course sent to pharmacy.  Watch weight.  Let me know when you're ready to try lower effexor dose.  Return in 4 months for physical.

## 2015-12-15 NOTE — Progress Notes (Signed)
BP 144/94 mmHg  Pulse 72  Temp(Src) 97.9 F (36.6 C) (Oral)  Wt 217 lb 4 oz (98.544 kg)   CC: HTN f/u   Subjective:    Patient ID: Jermaine Harris, male    DOB: September 04, 1961, 55 y.o.   MRN: [id number]  HPI: Jermaine Harris is a 55 y.o. male presenting on 12/15/2015 for Discuss meds and Leg Pain   Upcoming trip to [place] and [place] for 10 days (study abroad trip).   HTN - Compliant with current antihypertensive regimen of valsartan 320mg  daily. Amlodipine and hctz have not helped previously. Does check blood pressures at home: 123456 systolic - [facility]. No low blood pressure readings or symptoms of dizziness/syncope. Denies HA, vision changes, CP/tightness, SOB, leg swelling.   BP Readings from Last 3 Encounters:  12/15/15 144/94  09/21/15 146/104  06/24/15 136/82    Weight creeping up. Wants to come off effexor.   Residual L leg numbness after microdiscectomy 07/2015. Last visit we started B complex multivitamin. Also noticed worsened sexual dysfunction after surgery - we trialed viagra last visit as well.  Now over last week noticing R calf pain worse with sitting. Pain posterior lateral right calf, mild. Improves with ibuprofen. Worried stemming from known bulging disc in back. No swelling or erythema of leg. Requests steroid course for this in preparation for upcoming trip.  Relevant past medical, surgical, family and social history reviewed and updated as indicated. Interim medical history since our last visit reviewed. Allergies and medications reviewed and updated. Current Outpatient Prescriptions on File Prior to Visit  Medication Sig  . b complex vitamins tablet Take 1 tablet by mouth daily.  Marland Kitchen gabapentin (NEURONTIN) 300 MG capsule Take 300 mg by mouth 3 (three) times daily.  Marland Kitchen ibuprofen (ADVIL,MOTRIN) 200 MG tablet Take 200 mg by mouth as needed.  . sildenafil (VIAGRA) 100 MG tablet Take 0.5-1 tablets (50-100 mg total) by mouth daily as needed for erectile  dysfunction.  Marland Kitchen venlafaxine XR (EFFEXOR-XR) 75 MG 24 hr capsule Take 75 mg by mouth daily with breakfast.   No current facility-administered medications on file prior to visit.    Review of Systems Per HPI unless specifically indicated in ROS section     Objective:    BP 144/94 mmHg  Pulse 72  Temp(Src) 97.9 F (36.6 C) (Oral)  Wt 217 lb 4 oz (98.544 kg)  Wt Readings from Last 3 Encounters:  12/15/15 217 lb 4 oz (98.544 kg)  09/21/15 211 lb 12 oz (96.049 kg)  06/29/15 206 lb (93.441 kg)   Body mass index is 32.77 kg/(m^2).  Physical Exam  Constitutional: He is oriented to person, place, and time. He appears well-developed and well-nourished. No distress.  HENT:  Mouth/Throat: Oropharynx is clear and moist. No oropharyngeal exudate.  Eyes: Conjunctivae and EOM are normal. Pupils are equal, round, and reactive to light. No scleral icterus.  Neck: Normal range of motion. Neck supple.  Cardiovascular: Normal rate, regular rhythm, normal heart sounds and intact distal pulses.   No murmur heard. Pulmonary/Chest: Effort normal and breath sounds normal. No respiratory distress. He has no wheezes. He has no rales.  Musculoskeletal: He exhibits no edema.  No pain midline spine No paraspinous mm tenderness Neg SLR bilaterally. 2+ DP and PT bilaterally  Neurological: He is alert and oriented to person, place, and time. He has normal strength. A sensory deficit (known LLE numbness) is present. Coordination and gait normal.  Skin: Skin is warm and  dry. No rash noted.  Psychiatric: He has a normal mood and affect.  Nursing note and vitals reviewed.  Results for orders placed or performed in visit on 0000000  Basic metabolic panel  Result Value Ref Range   Sodium 140 135 - 145 mEq/L   Potassium 4.5 3.5 - 5.1 mEq/L   Chloride 107 96 - 112 mEq/L   CO2 28 19 - 32 mEq/L   Glucose, Bld 107 (H) 70 - 99 mg/dL   BUN 25 (H) 6 - 23 mg/dL   Creatinine, Ser 1.13 0.40 - 1.50 mg/dL   Calcium  9.7 8.4 - 10.5 mg/dL   GFR 71.42 >60.00 mL/min      Assessment & Plan:   Problem List Items Addressed This Visit    Right calf pain    Good pulses, doubt arterial claudication. Not consistent with DVT. Reviewed recent MRIs - initial one 06/2015 mentioned prominent disc protrusion at L4/5 leading to R>L mild subarticular narrowing and foraminal narrowing. Pt worried pain stemming from this which is possible. Will treat with prednisone course in anticipation of upcoming long plane ride/trip. Discussed side effects of prednisone use.      Obesity, Class I, BMI 30-34.9    Weight gain noted, discussed effect of this on leg pain and blood pressure. Encouraged increased activity for goal sustainable weight loss.      Left leg numbness    Residual despite microdiscectomy 07/2015 Continue B complex vitamin.      GAD (generalized anxiety disorder)    Interested in coming off effexor - discussed slow taper. Will update me when returns from [place] for lower effexor dose.      Essential hypertension - Primary    Persistently elevated despite max dose valsartan - will change to combo valsartan hctz 320/12.5mg  daily. New dose sent to pharmacy. Recheck at [facility] in 4 months. Pt agrees with plan.      Relevant Medications   valsartan-hydrochlorothiazide (DIOVAN-HCT) 320-12.5 MG tablet       Follow up plan: Return in about 4 months (around 04/13/2016), or as needed, for annual exam, prior fasting for blood work.

## 2015-12-15 NOTE — Assessment & Plan Note (Signed)
Weight gain noted, discussed effect of this on leg pain and blood pressure. Encouraged increased activity for goal sustainable weight loss.

## 2015-12-15 NOTE — Assessment & Plan Note (Signed)
Persistently elevated despite max dose valsartan - will change to combo valsartan hctz 320/12.5mg  daily. New dose sent to pharmacy. Recheck at [facility] in 4 months. Pt agrees with plan.

## 2015-12-15 NOTE — Assessment & Plan Note (Signed)
Interested in coming off effexor - discussed slow taper. Will update me when returns from [place] for lower effexor dose.

## 2015-12-16 ENCOUNTER — Telehealth: Payer: Self-pay | Admitting: *Deleted

## 2015-12-16 NOTE — Telephone Encounter (Signed)
Pt came in to have his BP machine calibrated, which I don't know how to calibrate an electronic cuff. Pt checked his BP using his machine 179/110 , I checked with our electronic cuff 181/108, then I checked manually 180/100. Pt states he doesn't feel funny, no headaches, no dizziness, no blurry vision, but he was just concerned because he will be going to [place] for 2 weeks, please advise

## 2015-12-17 NOTE — Telephone Encounter (Signed)
bp staying high. Would still start valsartan hctz 320/12.5 daily and update if persistently elevated to change regimen. Continue to avoid salt in diet.

## 2015-12-17 NOTE — Telephone Encounter (Signed)
Message left for patient to return my call.  

## 2015-12-24 NOTE — Telephone Encounter (Signed)
Message left for patient to return my call.  

## 2015-12-29 NOTE — Telephone Encounter (Signed)
Patient notified and verbalized understanding. 

## 2016-03-04 ENCOUNTER — Telehealth: Payer: Self-pay | Admitting: *Deleted
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
## 2016-03-08 ENCOUNTER — Encounter: Payer: Self-pay | Admitting: Family Medicine

## 2016-03-08 ENCOUNTER — Ambulatory Visit (INDEPENDENT_AMBULATORY_CARE_PROVIDER_SITE_OTHER): Payer: BLUE CROSS/BLUE SHIELD | Admitting: Family Medicine

## 2016-03-08 VITALS — BP 156/82 | HR 84 | Temp 98.3°F | Wt 219.2 lb

## 2016-03-08 DIAGNOSIS — R208 Other disturbances of skin sensation: Secondary | ICD-10-CM

## 2016-03-08 DIAGNOSIS — R200 Anesthesia of skin: Secondary | ICD-10-CM

## 2016-03-08 DIAGNOSIS — I10 Essential (primary) hypertension: Secondary | ICD-10-CM

## 2016-03-08 DIAGNOSIS — F411 Generalized anxiety disorder: Secondary | ICD-10-CM

## 2016-03-08 DIAGNOSIS — N528 Other male erectile dysfunction: Secondary | ICD-10-CM

## 2016-03-08 MED ORDER — VALSARTAN-HYDROCHLOROTHIAZIDE 320-25 MG PO TABS: 1.0000 | ORAL_TABLET | Freq: Every day | ORAL | Status: DC

## 2016-03-08 MED ORDER — VENLAFAXINE HCL ER 37.5 MG PO CP24: 37.5000 mg | ORAL_CAPSULE | Freq: Every day | ORAL | Status: DC

## 2016-03-08 MED ORDER — TADALAFIL 20 MG PO TABS: 10.0000 mg | ORAL_TABLET | ORAL | Status: DC | PRN

## 2016-03-08 NOTE — Assessment & Plan Note (Addendum)
Doing well. Interested in slow taper off effexor. Will send in lower dose, discussed taper schedule over next 2-3 months. Update if withdrawal sxs to return to previous dose.

## 2016-03-08 NOTE — Progress Notes (Signed)
Pre visit review using our clinic review tool, if applicable. No additional management support is needed unless otherwise documented below in the visit note. 

## 2016-03-08 NOTE — Assessment & Plan Note (Signed)
Discussed residual neurological deficit

## 2016-03-08 NOTE — Assessment & Plan Note (Signed)
viagra ineffective. Trial cialis. 
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
## 2016-03-08 NOTE — Assessment & Plan Note (Signed)
Persistently elevated. Increase diovan hct to 320/25mg  daily.  Check BMP 1-2 wks.  Reassess next visit.

## 2016-03-08 NOTE — Patient Instructions (Addendum)
Increase diovan hct to 320/25mg  once daily. Return in 1-2 weeks for lab visit only.  Update me with BP readings in 3-4 weeks, sooner if persistently high.  Let's decrease effexor to 37.5mg  daily over the next month. If tolerating this ok to decrease effexor to 37.5mg  every other day for 1 month then stop.  Trial cialis instead of viagra.

## 2016-03-17 ENCOUNTER — Other Ambulatory Visit (INDEPENDENT_AMBULATORY_CARE_PROVIDER_SITE_OTHER): Payer: BLUE CROSS/BLUE SHIELD

## 2016-03-17 DIAGNOSIS — I10 Essential (primary) hypertension: Secondary | ICD-10-CM

## 2016-03-17 LAB — BASIC METABOLIC PANEL
BUN: 25 mg/dL — ABNORMAL HIGH (ref 6–23)
CO2: 29 mEq/L (ref 19–32)
Calcium: 9.6 mg/dL (ref 8.4–10.5)
Chloride: 103 mEq/L (ref 96–112)
Creatinine, Ser: 1.29 mg/dL (ref 0.40–1.50)
GFR: 61.24 mL/min (ref >60.00)
Glucose, Bld: 153 mg/dL — ABNORMAL HIGH (ref 70–99)
Potassium: 3.9 mEq/L (ref 3.5–5.1)
Sodium: 139 mEq/L (ref 135–145)

## 2016-05-10 ENCOUNTER — Encounter: Payer: Self-pay | Admitting: Family Medicine

## 2016-05-10 ENCOUNTER — Ambulatory Visit (INDEPENDENT_AMBULATORY_CARE_PROVIDER_SITE_OTHER): Payer: BLUE CROSS/BLUE SHIELD | Admitting: Family Medicine

## 2016-05-10 VITALS — BP 134/90 | HR 67 | Temp 98.1°F | Ht 68.0 in | Wt 203.1 lb

## 2016-05-10 DIAGNOSIS — S39012A Strain of muscle, fascia and tendon of lower back, initial encounter: Secondary | ICD-10-CM | POA: Diagnosis not present

## 2016-05-10 MED ORDER — NAPROXEN 500 MG PO TABS: ORAL_TABLET | ORAL | Status: DC

## 2016-05-10 NOTE — Assessment & Plan Note (Signed)
No red flags today - anticipate benign lumbar strain. Discussed conservative measures. Treat with ice/heat, rest, naprosyn (stop ibuprofen), methocarbamol with sedation precautions and lower back exercises. Update if not improving with treatment for further evaluation.

## 2016-05-10 NOTE — Patient Instructions (Signed)
I think this is lumbar strain.  Treat with naprosyn course, robaxin you have at home (start at 1/2 tab), and continue ice. Do exercises provided today as well.  Should continue to improve over next 4 weeks.

## 2016-05-10 NOTE — Progress Notes (Signed)
   BP 134/90 mmHg  Pulse 67  Temp(Src) 98.1 F (36.7 C)  Ht 5\' 8"  (1.727 m)  Wt 203 lb 1.9 oz (92.135 kg)  BMI 30.89 kg/m2  SpO2 97%   CC: back pain  Subjective:    Patient ID: Jermaine Harris, male    DOB: [birth year], 55 y.o.   MRN: [id number]  HPI: Jermaine Harris is a 55 y.o. male presenting on 05/10/2016 for Back Pain   4d ago while working on the floor twisted back upon standing and felt sharp pain at left lower back without radiation. Worse with transitions from sitting to standing. Ice very helpful. Also tried methocarbamol which causing sedation. No pain with standing. Worse with leaning forward.   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 Recent MRI 06/2015 mentioned prominent disc protrusion at L4/5 leading to R>L mild subarticular narrowing and foraminal narrowing.   Currently taking 400-600mg  three times daily. Also taking gabapentin 600mg  nightly.   Relevant past medical, surgical, family and social history reviewed and updated as indicated. Interim medical history since our last visit reviewed. Allergies and medications reviewed and updated. Current Outpatient Prescriptions on File Prior to Visit  Medication Sig  . gabapentin (NEURONTIN) 300 MG capsule Take 600 mg by mouth at bedtime.   Marland Kitchen ibuprofen (ADVIL,MOTRIN) 200 MG tablet Take 200 mg by mouth as needed.  . tadalafil (CIALIS) 20 MG tablet Take 0.5-1 tablets (10-20 mg total) by mouth every other day as needed for erectile dysfunction.  . valsartan-hydrochlorothiazide (DIOVAN HCT) 320-25 MG tablet Take 1 tablet by mouth daily.   No current facility-administered medications on file prior to visit.    Review of Systems Per HPI unless specifically indicated in ROS section     Objective:    BP 134/90 mmHg  Pulse 67  Temp(Src) 98.1 F (36.7 C)  Ht 5\' 8"  (1.727 m)  Wt 203 lb 1.9 oz (92.135 kg)  BMI 30.89 kg/m2  SpO2 97%   Wt Readings from Last 3 Encounters:  05/10/16 203 lb 1.9 oz (92.135 kg)  03/08/16 219 lb 4 oz (99.451 kg)  12/15/15 217 lb 4 oz (98.544 kg)    Physical Exam  Constitutional: He is oriented to person, place, and time. He appears well-developed and well-nourished. No distress.  Musculoskeletal: He exhibits no edema.  No pain midline spine No paraspinous mm tenderness Tender to palpation left lateral lower back Neg SLR bilaterally. No pain with int/ext rotation at hip. Neg FABER. No pain at SIJ, GTB or sciatic notch bilaterally.   Neurological: He is alert and oriented to person, place, and time.  Skin: Skin is warm and dry. No rash noted.  Psychiatric: He has a normal mood and affect.  Nursing note and vitals reviewed.     Assessment & Plan:   Problem List Items Addressed This Visit    Lumbar strain - Primary    No red flags today - anticipate benign lumbar strain. Discussed conservative measures. Treat with ice/heat, rest, naprosyn (stop ibuprofen), methocarbamol with sedation precautions and lower back exercises. Update if not improving with treatment for further evaluation.           Follow up plan: Return if symptoms worsen or fail to improve.  Ria Bush, MD

## 2016-05-25 ENCOUNTER — Other Ambulatory Visit: Payer: Self-pay

## 2016-05-25 NOTE — Telephone Encounter (Signed)
Pt left v/m; pt was seen 03/08/16 and began to taper off effexor per pt request. Pt has been off effexor for approx 1 month. Pt feeling more anxious and wants to restart the effexor XR 75 mg. No SI/HI.[facility]. Pt has f/u appt with Dr Darnell Level 06/08/16. Pt does not want to wait until then to restart med. Dr Darnell Level out of office and may not have access to computer.Please advise.

## 2016-05-25 NOTE — Telephone Encounter (Signed)
Would he be willing to try different anxiety medication that may not have same hypertensive effect? Would suggest celexa 10mg  daily.

## 2016-05-25 NOTE — Telephone Encounter (Signed)
Because of his blood pressure issues I want to have Dr Darnell Level review this before giving the ok for it  I will route to him for the next time he gets to the computer  I will also route to Kim to let pt know

## 2016-05-26 MED ORDER — CITALOPRAM HYDROBROMIDE 10 MG PO TABS: 10.0000 mg | ORAL_TABLET | Freq: Every day | ORAL | Status: DC

## 2016-05-26 NOTE — Telephone Encounter (Signed)
Spoke with patient. He said to go ahead and send in the celexa, and he will think about trying it. After he called, he started thinking that he may just be overwhelmed. He is in the process of moving, getting married and experienced the death of a parent recently. He thinks once all of this settles down, he will be okay, but he would like to have the option of having the medication available. He will keep follow up as scheduled and Rx sent in as directed.

## 2016-06-08 ENCOUNTER — Ambulatory Visit: Payer: BLUE CROSS/BLUE SHIELD | Admitting: Family Medicine

## 2016-06-30 ENCOUNTER — Ambulatory Visit (INDEPENDENT_AMBULATORY_CARE_PROVIDER_SITE_OTHER): Payer: BLUE CROSS/BLUE SHIELD | Admitting: Family Medicine

## 2016-06-30 ENCOUNTER — Encounter: Payer: Self-pay | Admitting: Family Medicine

## 2016-06-30 VITALS — BP 124/84 | HR 68 | Temp 98.3°F | Wt 206.5 lb

## 2016-06-30 DIAGNOSIS — F411 Generalized anxiety disorder: Secondary | ICD-10-CM

## 2016-06-30 DIAGNOSIS — I10 Essential (primary) hypertension: Secondary | ICD-10-CM

## 2016-06-30 DIAGNOSIS — R208 Other disturbances of skin sensation: Secondary | ICD-10-CM | POA: Diagnosis not present

## 2016-06-30 DIAGNOSIS — R200 Anesthesia of skin: Secondary | ICD-10-CM

## 2016-06-30 MED ORDER — GABAPENTIN 300 MG PO CAPS: 300.0000 mg | ORAL_CAPSULE | Freq: Every day | ORAL | 3 refills | Status: DC

## 2016-06-30 MED ORDER — SILDENAFIL CITRATE 100 MG PO TABS: 50.0000 mg | ORAL_TABLET | Freq: Every day | ORAL | 3 refills | Status: DC | PRN

## 2016-06-30 MED ORDER — CITALOPRAM HYDROBROMIDE 10 MG PO TABS: 10.0000 mg | ORAL_TABLET | Freq: Every day | ORAL | 1 refills | Status: DC

## 2016-06-30 NOTE — Assessment & Plan Note (Signed)
Chronic, stable. Continue current regimen. 

## 2016-06-30 NOTE — Assessment & Plan Note (Signed)
Doing well on celexa 10mg  daily. Desires to continue this, 90d supply to pharmacy. Discussed wean off if desired over next year.

## 2016-06-30 NOTE — Assessment & Plan Note (Signed)
Reviewed residual neurological deficit after microdiscetomy 07/2015. Unable to play basketball or tennis but able to enjoy golf still. Pt has decided to cancel f/u with neurosurgery as would not pursue further treatment at this time. I will refill gabapentin. Discussed reasons to return to [place].

## 2016-06-30 NOTE — Patient Instructions (Addendum)
Blood pressures looking great! Continue current regimen. Gabapentin and celexa refilled today.  Return as needed or in 6 months for physical.

## 2016-06-30 NOTE — Progress Notes (Signed)
BP 124/84   Pulse 68   Temp 98.3 F (36.8 C) (Oral)   Wt 206 lb 8 oz (93.7 kg)   BMI 31.40 kg/m    CC: 3 mo f/u visit Subjective:    Patient ID: Jermaine Harris, male    DOB: January 26, 1961, 55 y.o.   MRN: [id number]  HPI: Jermaine Harris is a 55 y.o. male presenting on 06/30/2016 for Follow-up   Got married on Saturday. Lives in [place], [place].   HTN - Compliant with current antihypertensive regimen of diovan hct 320/25mg  daily. Does not check blood pressures at home. No symptoms of dizziness/syncope. Denies HA, vision changes, CP/tightness, SOB, leg swelling.    GAD - failed slow taper off effexor, now on celexa 10mg  daily. Doing well on this medication. Trouble separating celexa benefit vs stressors improving. No recurrent anxiety/claustrophbia.   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 Sxs controlled on nightly gabapentin. Has f/u appt with Dr Trenton Gammon 07/07/2016 but may cancel. Requests I refill gabapentin.   Relevant past medical, surgical, family and social history reviewed and updated as indicated. Interim medical history since our last visit reviewed. Allergies and medications reviewed and updated. Current Outpatient Prescriptions on File Prior to Visit  Medication Sig  . ibuprofen (ADVIL,MOTRIN) 200 MG tablet Take 200 mg by mouth as needed.  . valsartan-hydrochlorothiazide (DIOVAN HCT) 320-25 MG tablet Take 1 tablet by mouth daily.   No current facility-administered medications on file prior to visit.     Review of Systems Per HPI unless specifically indicated in ROS section     Objective:    BP 124/84   Pulse 68   Temp 98.3 F (36.8 C) (Oral)   Wt 206 lb 8 oz (93.7 kg)   BMI 31.40 kg/m   Wt Readings from Last 3 Encounters:  06/30/16 206 lb 8 oz (93.7 kg)  05/10/16 203 lb 1.9 oz (92.1 kg)  03/08/16 219 lb 4 oz (99.5 kg)    Physical Exam Results for orders placed or  performed in visit on 99991111  Basic metabolic panel  Result Value Ref Range   Sodium 139 135 - 145 mEq/L   Potassium 3.9 3.5 - 5.1 mEq/L   Chloride 103 96 - 112 mEq/L   CO2 29 19 - 32 mEq/L   Glucose, Bld 153 (H) 70 - 99 mg/dL   BUN 25 (H) 6 - 23 mg/dL   Creatinine, Ser 1.29 0.40 - 1.50 mg/dL   Calcium 9.6 8.4 - 10.5 mg/dL   GFR 61.24 >60.00 mL/min      Assessment & Plan:   Problem List Items Addressed This Visit    Essential hypertension - Primary    Chronic, stable. Continue current regimen.      Relevant Medications   sildenafil (VIAGRA) 100 MG tablet   GAD (generalized anxiety disorder)    Doing well on celexa 10mg  daily. Desires to continue this, 90d supply to pharmacy. Discussed wean off if desired over next year.       Left leg numbness    Reviewed residual neurological deficit after microdiscetomy 07/2015. Unable to play basketball or tennis but able to enjoy golf still. Pt has decided to cancel f/u with neurosurgery as would not pursue further treatment at this time. I will refill gabapentin. Discussed reasons to return to [place].        Other Visit Diagnoses   None.      Follow up plan:  Return in about 6 months (around 12/31/2016), or as needed, for annual exam, prior fasting for blood work.  Ria Bush, MD

## 2016-08-17 DIAGNOSIS — F4322 Adjustment disorder with anxiety: Secondary | ICD-10-CM | POA: Diagnosis not present

## 2016-08-24 DIAGNOSIS — F4322 Adjustment disorder with anxiety: Secondary | ICD-10-CM | POA: Diagnosis not present

## 2016-09-03 ENCOUNTER — Other Ambulatory Visit: Payer: Self-pay | Admitting: Family Medicine

## 2016-09-07 DIAGNOSIS — F4322 Adjustment disorder with anxiety: Secondary | ICD-10-CM | POA: Diagnosis not present

## 2016-11-14 ENCOUNTER — Encounter: Payer: Self-pay | Admitting: Internal Medicine

## 2016-11-14 ENCOUNTER — Ambulatory Visit (INDEPENDENT_AMBULATORY_CARE_PROVIDER_SITE_OTHER): Payer: BLUE CROSS/BLUE SHIELD | Admitting: Internal Medicine

## 2016-11-14 VITALS — BP 112/78 | HR 96 | Temp 99.7°F | Wt 210.5 lb

## 2016-11-14 DIAGNOSIS — B9789 Other viral agents as the cause of diseases classified elsewhere: Secondary | ICD-10-CM | POA: Diagnosis not present

## 2016-11-14 DIAGNOSIS — J069 Acute upper respiratory infection, unspecified: Secondary | ICD-10-CM | POA: Diagnosis not present

## 2016-11-14 NOTE — Patient Instructions (Signed)
Upper Respiratory Infection, Adult Most upper respiratory infections (URIs) are caused by a virus. A URI affects the nose, throat, and upper air passages. The most common type of URI is often called "the common cold." Follow these instructions at home:  Take medicines only as told by your doctor.  Gargle warm saltwater or take cough drops to comfort your throat as told by your doctor.  Use a warm mist humidifier or inhale steam from a shower to increase air moisture. This may make it easier to breathe.  Drink enough fluid to keep your pee (urine) clear or pale yellow.  Eat soups and other clear broths.  Have a healthy diet.  Rest as needed.  Go back to work when your fever is gone or your doctor says it is okay.  You may need to stay home longer to avoid giving your URI to others.  You can also wear a face mask and wash your hands often to prevent spread of the virus.  Use your inhaler more if you have asthma.  Do not use any tobacco products, including cigarettes, chewing tobacco, or electronic cigarettes. If you need help quitting, ask your doctor. Contact a doctor if:  You are getting worse, not better.  Your symptoms are not helped by medicine.  You have chills.  You are getting more short of breath.  You have brown or red mucus.  You have yellow or brown discharge from your nose.  You have pain in your face, especially when you bend forward.  You have a fever.  You have puffy (swollen) neck glands.  You have pain while swallowing.  You have white areas in the back of your throat. Get help right away if:  You have very bad or constant:  Headache.  Ear pain.  Pain in your forehead, behind your eyes, and over your cheekbones (sinus pain).  Chest pain.  You have long-lasting (chronic) lung disease and any of the following:  Wheezing.  Long-lasting cough.  Coughing up blood.  A change in your usual mucus.  You have a stiff neck.  You have  changes in your:  Vision.  Hearing.  Thinking.  Mood. This information is not intended to replace advice given to you by your health care provider. Make sure you discuss any questions you have with your health care provider. Document Released: 05/09/2008 Document Revised: 07/24/2016 Document Reviewed: 02/26/2014 Elsevier Interactive Patient Education  2017 Elsevier Inc.  

## 2016-11-14 NOTE — Progress Notes (Signed)
Pre visit review using our clinic review tool, if applicable. No additional management support is needed unless otherwise documented below in the visit note. 

## 2016-11-14 NOTE — Progress Notes (Signed)
HPI  Pt presents to the clinic today with c/o nasal congestion and cough. This started 4 days ago. He is not blowing anything out of his nose. The cough is nonproductive. He denies chills or body aches, but has had some fevers up to 100.5. He has tried Mucinex, Sudafed and Ibuprofen with minimal relief. He has a history of seasonal allergies. He has not had sick contacts that he is aware of. His flu shot is UTD per his report.  Review of Systems        Past Medical History:  Diagnosis Date  . Actinic keratosis   . Claustrophobia   . GAD (generalized anxiety disorder)    claustrophobia, some panic attacks  . HTN (hypertension)   . Irritable bowel syndrome   . Lumbar herniated disc 06/2015  . Seasonal allergies     Family History  Problem Relation Age of Onset  . Heart disease Father     pacer and CHF  . Transient ischemic attack Mother 65    several  . Depression Mother   . Depression Father   . Colon polyps Sister   . Sudden death Maternal Grandfather 77    ?mustard gas    Social History   Social History  . Marital status: Divorced    Spouse name: N/A  . Number of children: N/A  . Years of education: N/A   Occupational History  . Not on file.   Social History Main Topics  . Smoking status: Never Smoker  . Smokeless tobacco: Never Used  . Alcohol use 0.0 oz/week     Comment: couple of drinks per week  . Drug use: No  . Sexual activity: Not on file   Other Topics Concern  . Not on file   Social History Narrative   Divorced from wife who was having affair (2015)   Engaged to be married summer 2017   Lives alone, 2 cats   Occupation: Paramedic Leisure centre manager)   Edu: PhD   Activity: golf, walking, yardwork   Diet: good water, vegetables daily (salads)    Allergies  Allergen Reactions  . Ace Inhibitors Cough     Constitutional: Positive fever. Denies headache, fatigue, or abrupt weight changes.  HEENT:  Positive nasal congestion. Denies eye  redness, eye pain, pressure behind the eyes, facial pain, ear pain, ringing in the ears, wax buildup, runny nose or sore throat. Respiratory: Positive cough. Denies difficulty breathing or shortness of breath.  Cardiovascular: Denies chest pain, chest tightness, palpitations or swelling in the hands or feet.   No other specific complaints in a complete review of systems (except as listed in HPI above).  Objective:   BP 112/78   Pulse 96   Temp 99.7 F (37.6 C) (Oral)   Wt 210 lb 8 oz (95.5 kg)   SpO2 97%   BMI 32.01 kg/m   Wt Readings from Last 3 Encounters:  11/14/16 210 lb 8 oz (95.5 kg)  06/30/16 206 lb 8 oz (93.7 kg)  05/10/16 203 lb 1.9 oz (92.1 kg)     General: Appears his stated age,  in NAD. HEENT: Head: normal shape and size, no sinus tenderness noted; Eyes: sclera white, no icterus, conjunctiva pink; Ears: Tm's gray and intact, normal light reflex; Nose: mucosa pink and moist, septum midline; Throat/Mouth: + PND. Teeth present, mucosa pink and moist, no exudate noted, no lesions or ulcerations noted.  Neck: No cervical lymphadenopathy.  Cardiovascular: Normal rate and rhythm. S1,S2 noted.  No  murmur, rubs or gallops noted.  Pulmonary/Chest: Normal effort and positive vesicular breath sounds. No respiratory distress. No wheezes, rales or ronchi noted.       Assessment & Plan:   Viral Upper Respiratory Infection with Cough:  Get some rest and drink plenty of water Switch Ibuprofen to Tylenol to see if it helps with fever Continue Mucinex Delsym as needed for cough  RTC as needed or if symptoms persist.   Webb Silversmith, NP

## 2016-12-12 ENCOUNTER — Other Ambulatory Visit: Payer: Self-pay | Admitting: Family Medicine

## 2016-12-12 NOTE — Telephone Encounter (Signed)
Last refill 06/30/16 #90+1, last OV 06/30/16. OK to refill?

## 2017-03-04 ENCOUNTER — Other Ambulatory Visit: Payer: Self-pay | Admitting: Family Medicine

## 2017-04-28 DIAGNOSIS — A084 Viral intestinal infection, unspecified: Secondary | ICD-10-CM | POA: Diagnosis not present

## 2017-04-28 DIAGNOSIS — R197 Diarrhea, unspecified: Secondary | ICD-10-CM | POA: Diagnosis not present

## 2017-06-04 ENCOUNTER — Other Ambulatory Visit: Payer: Self-pay | Admitting: Family Medicine

## 2017-06-05 ENCOUNTER — Other Ambulatory Visit: Payer: Self-pay | Admitting: Family Medicine

## 2017-06-05 NOTE — Telephone Encounter (Signed)
Routing to PCP since there hasn't been a recent or future appt

## 2017-06-21 ENCOUNTER — Other Ambulatory Visit: Payer: Self-pay | Admitting: Family Medicine

## 2017-07-28 ENCOUNTER — Ambulatory Visit (INDEPENDENT_AMBULATORY_CARE_PROVIDER_SITE_OTHER): Payer: BLUE CROSS/BLUE SHIELD | Admitting: Family Medicine

## 2017-07-28 ENCOUNTER — Encounter: Payer: Self-pay | Admitting: Family Medicine

## 2017-07-28 VITALS — BP 138/84 | HR 85 | Temp 98.1°F | Wt 211.0 lb

## 2017-07-28 DIAGNOSIS — R42 Dizziness and giddiness: Secondary | ICD-10-CM

## 2017-07-28 DIAGNOSIS — I10 Essential (primary) hypertension: Secondary | ICD-10-CM | POA: Diagnosis not present

## 2017-07-28 DIAGNOSIS — F411 Generalized anxiety disorder: Secondary | ICD-10-CM

## 2017-07-28 MED ORDER — LOSARTAN POTASSIUM 50 MG PO TABS: 50.0000 mg | ORAL_TABLET | Freq: Every day | ORAL | 6 refills | Status: DC

## 2017-07-28 NOTE — Assessment & Plan Note (Signed)
Possible mild orthostasis although story more consistent with peripheral positional vertigo. Regardless, largely resolved. Will continue to monitor this.

## 2017-07-28 NOTE — Assessment & Plan Note (Signed)
Overall stable on celexa 10mg  - continue.

## 2017-07-28 NOTE — Patient Instructions (Signed)
[email or web address] recall list - check on this website. Ok to stay off diovan hct for now.  Printed Rx for losartan 50mg  to start if blood pressures start creeping up (>140/90) Schedule physical in 1-2 months and we will recheck blood pressures at that time.

## 2017-07-28 NOTE — Progress Notes (Addendum)
BP 138/84 (BP Location: Right Arm, Cuff Size: Large)   Pulse 85   Temp 98.1 F (36.7 C) (Oral)   Wt 211 lb (95.7 kg)   SpO2 95%   BMI 32.08 kg/m    CC: dizziness Subjective:    Patient ID: Jermaine Harris, male    DOB: July 21, 1961, 56 y.o.   MRN: [id number]  HPI: Jermaine Harris is a 56 y.o. male presenting on 07/28/2017 for Dizziness (Started 07-25-17 after doing a backflip while in the pool. Stopped taking his valsartan-hctz 07-26-17. )   Dizziness while doing underwater flips 3d ago. Decided to stop diovan hct. Otherwise some lightheadedness with sudden position changes.  Has not been checking BP at home.  No HA, vision changes, CP/tightness, SOB, leg swelling.   Denies falls, no LOC, presyncope, vertigo.   bp higher when on effexor Now on celexa - not as effective for anxiety but still 70-80% effective.   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 Recent MRI 06/2015 mentioned prominent disc protrusion at L4/5 leading to R>L mild subarticular narrowing and foraminal narrowing.   Relevant past medical, surgical, family and social history reviewed and updated as indicated. Interim medical history since our last visit reviewed. Allergies and medications reviewed and updated. Outpatient Medications Prior to Visit  Medication Sig Dispense Refill  . citalopram (CELEXA) 10 MG tablet Take 1 tablet (10 mg total) by mouth daily. PLEASE SCHEDULE OFFICE VISIT 90 tablet 0  . gabapentin (NEURONTIN) 300 MG capsule TAKE 1 CAPSULE (300 MG TOTAL) BY MOUTH AT BEDTIME. 90 capsule 3  . ibuprofen (ADVIL,MOTRIN) 200 MG tablet Take 200 mg by mouth as needed.    . sildenafil (VIAGRA) 100 MG tablet Take 0.5-1 tablets (50-100 mg total) by mouth daily as needed for erectile dysfunction. 10 tablet 3  . valsartan-hydrochlorothiazide (DIOVAN-HCT) 320-25 MG tablet TAKE 1 TABLET BY MOUTH DAILY. 90 tablet 0  . citalopram  (CELEXA) 10 MG tablet TAKE 1 TABLET (10 MG TOTAL) BY MOUTH DAILY. 90 tablet 1   No facility-administered medications prior to visit.      Per HPI unless specifically indicated in ROS section below Review of Systems     Objective:    BP 138/84 (BP Location: Right Arm, Cuff Size: Large)   Pulse 85   Temp 98.1 F (36.7 C) (Oral)   Wt 211 lb (95.7 kg)   SpO2 95%   BMI 32.08 kg/m   Wt Readings from Last 3 Encounters:  07/28/17 211 lb (95.7 kg)  11/14/16 210 lb 8 oz (95.5 kg)  06/30/16 206 lb 8 oz (93.7 kg)    Physical Exam  Constitutional: He is oriented to person, place, and time. He appears well-developed and well-nourished. No distress.  HENT:  Head: Normocephalic and atraumatic.  Mouth/Throat: Oropharynx is clear and moist. No oropharyngeal exudate.  Eyes: Pupils are equal, round, and reactive to light. Conjunctivae and EOM are normal. No scleral icterus.  Neck: Normal range of motion. Neck supple. Carotid bruit is not present.  Cardiovascular: Normal rate, regular rhythm, normal heart sounds and intact distal pulses.   No murmur heard. Pulmonary/Chest: Breath sounds normal. No respiratory distress. He has no wheezes. He has no rales.  Neurological: He is alert and oriented to person, place, and time. No cranial nerve deficit or sensory deficit.  CN 2-12 intact FTN intact EOMI Neg dix hallpike  Skin: Skin is warm and dry. No rash noted.  Nursing  note and vitals reviewed.  Results for orders placed or performed in visit on 71/16/57  Basic metabolic panel  Result Value Ref Range   Sodium 139 135 - 145 mEq/L   Potassium 3.9 3.5 - 5.1 mEq/L   Chloride 103 96 - 112 mEq/L   CO2 29 19 - 32 mEq/L   Glucose, Bld 153 (H) 70 - 99 mg/dL   BUN 25 (H) 6 - 23 mg/dL   Creatinine, Ser 1.29 0.40 - 1.50 mg/dL   Calcium 9.6 8.4 - 10.5 mg/dL   GFR 61.24 >60.00 mL/min      Assessment & Plan:   Problem List Items Addressed This Visit    Dizziness - Primary    Possible mild  orthostasis although story more consistent with peripheral positional vertigo. Regardless, largely resolved. Will continue to monitor this.       Essential hypertension    Off diovan hct for last few days, bp remaining stable. Will monitor bp at home and if elevated will restart losartan 50mg  daily (due to recent valsartan recall). Prior BP issues likely contributed by effexor use. Pt agrees with plan. Recheck at upcoming CPE ( he will schedule)      Relevant Medications   losartan (COZAAR) 50 MG tablet   GAD (generalized anxiety disorder)    Overall stable on celexa 10mg  - continue.           Follow up plan: Return in about 2 months (around 09/27/2017) for annual exam, prior fasting for blood work.  Ria Bush, MD

## 2017-07-28 NOTE — Assessment & Plan Note (Signed)
Off diovan hct for last few days, bp remaining stable. Will monitor bp at home and if elevated will restart losartan 50mg  daily (due to recent valsartan recall). Prior BP issues likely contributed by effexor use. Pt agrees with plan. Recheck at upcoming CPE ( he will schedule)

## 2017-09-02 ENCOUNTER — Other Ambulatory Visit: Payer: Self-pay | Admitting: Family Medicine

## 2017-11-13 ENCOUNTER — Other Ambulatory Visit: Payer: Self-pay | Admitting: Family Medicine

## 2017-11-13 DIAGNOSIS — Z1159 Encounter for screening for other viral diseases: Secondary | ICD-10-CM

## 2017-11-13 DIAGNOSIS — Z125 Encounter for screening for malignant neoplasm of prostate: Secondary | ICD-10-CM

## 2017-11-13 DIAGNOSIS — R739 Hyperglycemia, unspecified: Secondary | ICD-10-CM

## 2017-11-13 DIAGNOSIS — I10 Essential (primary) hypertension: Secondary | ICD-10-CM

## 2017-11-15 ENCOUNTER — Other Ambulatory Visit: Payer: BLUE CROSS/BLUE SHIELD

## 2017-11-16 ENCOUNTER — Other Ambulatory Visit (INDEPENDENT_AMBULATORY_CARE_PROVIDER_SITE_OTHER): Payer: BLUE CROSS/BLUE SHIELD

## 2017-11-16 DIAGNOSIS — R739 Hyperglycemia, unspecified: Secondary | ICD-10-CM | POA: Diagnosis not present

## 2017-11-16 DIAGNOSIS — Z125 Encounter for screening for malignant neoplasm of prostate: Secondary | ICD-10-CM | POA: Diagnosis not present

## 2017-11-16 DIAGNOSIS — Z1159 Encounter for screening for other viral diseases: Secondary | ICD-10-CM | POA: Diagnosis not present

## 2017-11-16 DIAGNOSIS — I10 Essential (primary) hypertension: Secondary | ICD-10-CM | POA: Diagnosis not present

## 2017-11-16 LAB — COMPREHENSIVE METABOLIC PANEL
ALT: 29 U/L (ref 0–53)
AST: 18 U/L (ref 0–37)
Albumin: 4.3 g/dL (ref 3.5–5.2)
Alkaline Phosphatase: 59 U/L (ref 39–117)
BUN: 21 mg/dL (ref 6–23)
CO2: 30 mEq/L (ref 19–32)
Calcium: 9.1 mg/dL (ref 8.4–10.5)
Chloride: 104 mEq/L (ref 96–112)
Creatinine, Ser: 1.26 mg/dL (ref 0.40–1.50)
GFR: 62.55 mL/min (ref >60.00)
Glucose, Bld: 123 mg/dL — ABNORMAL HIGH (ref 70–99)
Potassium: 4.4 mEq/L (ref 3.5–5.1)
Sodium: 139 mEq/L (ref 135–145)
Total Bilirubin: 0.4 mg/dL (ref 0.2–1.2)
Total Protein: 6.7 g/dL (ref 6.0–8.3)

## 2017-11-16 LAB — HEMOGLOBIN A1C: Hgb A1c MFr Bld: 6.6 % — ABNORMAL HIGH (ref 4.6–6.5)

## 2017-11-16 LAB — LDL CHOLESTEROL, DIRECT: Direct LDL: 90 mg/dL

## 2017-11-16 LAB — LIPID PANEL
Cholesterol: 140 mg/dL (ref 0–200)
HDL: 31.5 mg/dL — ABNORMAL LOW (ref >39.00)
NonHDL: 108.7
Total CHOL/HDL Ratio: 4
Triglycerides: 259 mg/dL — ABNORMAL HIGH (ref 0.0–149.0)
VLDL: 51.8 mg/dL — ABNORMAL HIGH (ref 0.0–40.0)

## 2017-11-16 LAB — PSA: PSA: 0.54 ng/mL (ref 0.10–4.00)

## 2017-11-16 LAB — TSH: TSH: 2.75 u[IU]/mL (ref 0.35–4.50)

## 2017-11-17 LAB — HEPATITIS C ANTIBODY
Hepatitis C Ab: NONREACTIVE
SIGNAL TO CUT-OFF: 0.01 (ref <1.00)

## 2017-11-22 ENCOUNTER — Ambulatory Visit (INDEPENDENT_AMBULATORY_CARE_PROVIDER_SITE_OTHER): Payer: BLUE CROSS/BLUE SHIELD | Admitting: Family Medicine

## 2017-11-22 ENCOUNTER — Encounter: Payer: Self-pay | Admitting: Family Medicine

## 2017-11-22 VITALS — BP 136/84 | HR 68 | Temp 98.2°F | Ht 67.0 in | Wt 208.0 lb

## 2017-11-22 DIAGNOSIS — Z0000 Encounter for general adult medical examination without abnormal findings: Secondary | ICD-10-CM

## 2017-11-22 DIAGNOSIS — R200 Anesthesia of skin: Secondary | ICD-10-CM

## 2017-11-22 DIAGNOSIS — E1136 Type 2 diabetes mellitus with diabetic cataract: Secondary | ICD-10-CM

## 2017-11-22 DIAGNOSIS — E669 Obesity, unspecified: Secondary | ICD-10-CM | POA: Diagnosis not present

## 2017-11-22 DIAGNOSIS — F411 Generalized anxiety disorder: Secondary | ICD-10-CM | POA: Diagnosis not present

## 2017-11-22 DIAGNOSIS — E66811 Obesity, class 1: Secondary | ICD-10-CM

## 2017-11-22 DIAGNOSIS — I10 Essential (primary) hypertension: Secondary | ICD-10-CM

## 2017-11-22 DIAGNOSIS — E1139 Type 2 diabetes mellitus with other diabetic ophthalmic complication: Secondary | ICD-10-CM | POA: Insufficient documentation

## 2017-11-22 MED ORDER — CITALOPRAM HYDROBROMIDE 20 MG PO TABS: 20.0000 mg | ORAL_TABLET | Freq: Every day | ORAL | 3 refills | Status: DC

## 2017-11-22 MED ORDER — LOSARTAN POTASSIUM 100 MG PO TABS: 100.0000 mg | ORAL_TABLET | Freq: Every day | ORAL | 3 refills | Status: DC

## 2017-11-22 MED ORDER — GABAPENTIN 300 MG PO CAPS: 300.0000 mg | ORAL_CAPSULE | Freq: Every day | ORAL | 3 refills | Status: DC

## 2017-11-22 NOTE — Progress Notes (Signed)
BP 136/84 (BP Location: Left Arm, Patient Position: Sitting, Cuff Size: Normal)   Pulse 68   Temp 98.2 F (36.8 C) (Oral)   Ht 5\' 7"  (1.702 m)   Wt 208 lb (94.3 kg)   SpO2 95%   BMI 32.58 kg/m    CC: CPE Subjective:    Patient ID: Jermaine Harris, male    DOB: Jun 24, 1961, 56 y.o.   MRN: [id number]  HPI: Jermaine Harris is a 56 y.o. male presenting on 11/22/2017 for Annual Exam (Wants to discuss valsartan-HCTZ. Needs refills on meds but is going out of the country soon, wants to discuss)   Upcoming sabbatical - 3 mo in [place] starting Jan 2nd.  Feels celexa 10mg  not as effective as effexor. Takes this for claustrophobia with panic attacks.  Recent change to losartan 50mg  from valsartan hctz 320/25mg  due to possible recall - pt checked and manufacturer wasn't affected.   Preventative: COLONOSCOPY Date: 2012 mild diverticulosis, rec rpt 5 yrs 2/2 fmhx polyps ([place]). Would like to get this done in summer.  Prostate cancer screening - has had in past normal. Discussed. Would like screening. Flu shot yearly Td 2010 Seat belt use discussed Sunscreen use discussed, no changing moles on skin Non smoker Alcohol - 1 glass wine a few times a week  Divorced from wife who was having affair (2015) Lives alone, 2 cats Occupation: Paramedic Leisure centre manager) Edu: PhD Activity: golf, walking, yardwork - planning on walking around [place] Diet: good water, vegetables daily (salads)   Relevant past medical, surgical, family and social history reviewed and updated as indicated. Interim medical history since our last visit reviewed. Allergies and medications reviewed and updated. Outpatient Medications Prior to Visit  Medication Sig Dispense Refill  . ibuprofen (ADVIL,MOTRIN) 200 MG tablet Take 200 mg by mouth as needed.    . sildenafil (VIAGRA) 100 MG tablet Take 0.5-1 tablets (50-100 mg total) by mouth daily as needed for erectile dysfunction. 10 tablet 3  .  citalopram (CELEXA) 10 MG tablet Take 1 tablet (10 mg total) by mouth daily. 90 tablet 1  . gabapentin (NEURONTIN) 300 MG capsule TAKE 1 CAPSULE (300 MG TOTAL) BY MOUTH AT BEDTIME. 90 capsule 3  . losartan (COZAAR) 50 MG tablet Take 1 tablet (50 mg total) by mouth daily. 30 tablet 6  . valsartan-hydrochlorothiazide (DIOVAN-HCT) 320-25 MG tablet TAKE 1 TABLET BY MOUTH DAILY. (Patient not taking: Reported on 11/22/2017) 90 tablet 0   No facility-administered medications prior to visit.      Per HPI unless specifically indicated in ROS section below Review of Systems  Constitutional: Negative for activity change, appetite change, chills, fatigue, fever and unexpected weight change.  HENT: Negative for hearing loss.   Eyes: Negative for visual disturbance.  Respiratory: Negative for cough, chest tightness, shortness of breath and wheezing.   Cardiovascular: Negative for chest pain, palpitations and leg swelling.  Gastrointestinal: Negative for abdominal distention, abdominal pain, blood in stool, constipation, diarrhea, nausea and vomiting.  Genitourinary: Negative for difficulty urinating and hematuria.  Musculoskeletal: Negative for arthralgias, myalgias and neck pain.  Skin: Negative for rash.  Neurological: Negative for dizziness, seizures, syncope and headaches.  Hematological: Negative for adenopathy. Does not bruise/bleed easily.  Psychiatric/Behavioral: Negative for dysphoric mood. The patient is nervous/anxious.        Objective:    BP 136/84 (BP Location: Left Arm, Patient Position: Sitting, Cuff Size: Normal)   Pulse 68   Temp 98.2 F (36.8 C) (Oral)  Ht 5\' 7"  (1.702 m)   Wt 208 lb (94.3 kg)   SpO2 95%   BMI 32.58 kg/m   Wt Readings from Last 3 Encounters:  11/22/17 208 lb (94.3 kg)  07/28/17 211 lb (95.7 kg)  11/14/16 210 lb 8 oz (95.5 kg)    Physical Exam  Constitutional: He is oriented to person, place, and time. He appears well-developed and well-nourished. No  distress.  HENT:  Head: Normocephalic and atraumatic.  Right Ear: Hearing, tympanic membrane, external ear and ear canal normal.  Left Ear: Hearing, tympanic membrane, external ear and ear canal normal.  Nose: Nose normal.  Mouth/Throat: Uvula is midline, oropharynx is clear and moist and mucous membranes are normal. No oropharyngeal exudate, posterior oropharyngeal edema or posterior oropharyngeal erythema.  Eyes: Conjunctivae and EOM are normal. Pupils are equal, round, and reactive to light. No scleral icterus.  Bilateral cataracts noted  Neck: Normal range of motion. Neck supple. No thyromegaly present.  Cardiovascular: Normal rate, regular rhythm, normal heart sounds and intact distal pulses.  No murmur heard. Pulses:      Radial pulses are 2+ on the right side, and 2+ on the left side.  Pulmonary/Chest: Effort normal and breath sounds normal. No respiratory distress. He has no wheezes. He has no rales.  Abdominal: Soft. Bowel sounds are normal. He exhibits no distension and no mass. There is no tenderness. There is no rebound and no guarding.  Genitourinary: Rectum normal and prostate normal. Rectal exam shows no external hemorrhoid, no internal hemorrhoid, no fissure, no mass, no tenderness and anal tone normal. Prostate is not enlarged (20gm) and not tender.  Musculoskeletal: Normal range of motion. He exhibits no edema.  Lymphadenopathy:    He has no cervical adenopathy.  Neurological: He is alert and oriented to person, place, and time.  CN grossly intact, station and gait intact  Skin: Skin is warm and dry. No rash noted.  Psychiatric: He has a normal mood and affect. His behavior is normal. Judgment and thought content normal.  Nursing note and vitals reviewed.  Results for orders placed or performed in visit on 11/16/17  Hepatitis C antibody  Result Value Ref Range   Hepatitis C Ab NON-REACTIVE NON-REACTI   SIGNAL TO CUT-OFF 0.01 <1.00  PSA  Result Value Ref Range   PSA  0.54 0.10 - 4.00 ng/mL  Hemoglobin A1c  Result Value Ref Range   Hgb A1c MFr Bld 6.6 (H) 4.6 - 6.5 %  TSH  Result Value Ref Range   TSH 2.75 0.35 - 4.50 uIU/mL  Comprehensive metabolic panel  Result Value Ref Range   Sodium 139 135 - 145 mEq/L   Potassium 4.4 3.5 - 5.1 mEq/L   Chloride 104 96 - 112 mEq/L   CO2 30 19 - 32 mEq/L   Glucose, Bld 123 (H) 70 - 99 mg/dL   BUN 21 6 - 23 mg/dL   Creatinine, Ser 1.26 0.40 - 1.50 mg/dL   Total Bilirubin 0.4 0.2 - 1.2 mg/dL   Alkaline Phosphatase 59 39 - 117 U/L   AST 18 0 - 37 U/L   ALT 29 0 - 53 U/L   Total Protein 6.7 6.0 - 8.3 g/dL   Albumin 4.3 3.5 - 5.2 g/dL   Calcium 9.1 8.4 - 10.5 mg/dL   GFR 62.55 >60.00 mL/min  Lipid panel  Result Value Ref Range   Cholesterol 140 0 - 200 mg/dL   Triglycerides 259.0 (H) 0.0 - 149.0 mg/dL   HDL  31.50 (L) >39.00 mg/dL   VLDL 51.8 (H) 0.0 - 40.0 mg/dL   Total CHOL/HDL Ratio 4    NonHDL 108.70   LDL cholesterol, direct  Result Value Ref Range   Direct LDL 90.0 mg/dL      Assessment & Plan:   Problem List Items Addressed This Visit    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 Reviewed with patient. Diet controlled range. Evidence of cataracts on exam. Reviewed diabetic diet to help control sugars. RTC 4 mo f/u visit. Pt agrees with plan.       Relevant Medications   losartan (COZAAR) 100 MG tablet   Essential hypertension    Chronic, stable. Increase losartan to 100mg  daily.  Valsartan /hctz stopped due to possible recall.       Relevant Medications   losartan (COZAAR) 100 MG tablet   GAD (generalized anxiety disorder)    Feels celexa 10mg  ineffective - will increase to 20mg  daily. Anticipate sabbatical and increased activity at [place] will help.       Relevant Medications   citalopram (CELEXA) 20 MG tablet   Health maintenance examination - Primary    Preventative protocols reviewed and  updated unless pt declined. Discussed healthy diet and lifestyle.       Left leg numbness    Chronic residual deficit.       Obesity, Class I, BMI 30-34.9    Discussed healthy diet and lifestyle changes to affect sustainable weight loss.           Follow up plan: Return in about 4 months (around 03/23/2018) for follow up visit.  Ria Bush, MD

## 2017-11-22 NOTE — Assessment & Plan Note (Signed)
Discussed healthy diet and lifestyle changes to affect sustainable weight loss  

## 2017-11-22 NOTE — Assessment & Plan Note (Addendum)
Chronic, stable. Increase losartan to 100mg  daily.  Valsartan /hctz stopped due to possible recall.

## 2017-11-22 NOTE — Assessment & Plan Note (Signed)
Chronic residual deficit.

## 2017-11-22 NOTE — Assessment & Plan Note (Signed)
Feels celexa 10mg  ineffective - will increase to 20mg  daily. Anticipate sabbatical and increased activity at [place] will help.

## 2017-11-22 NOTE — Patient Instructions (Addendum)
Increase celexa to 20mg  daily. Increase losartan to 100mg  daily and keep an eye on blood pressures.  Call us when you return from [place] and we will schedule colonoscopy in [place].  Hearing screen today.  Your sugar was too high - in diet controlled diabetes range but we can do better. Work on low sugar low carb diet over next 6 months, return for follow up in 4 months.   Health Maintenance, Male A healthy lifestyle and preventive care is important for your health and wellness. Ask your health care provider about what schedule of regular examinations is right for you. What should I know about weight and diet? Eat a Healthy Diet  Eat plenty of vegetables, fruits, whole grains, low-fat dairy products, and lean protein.  Do not eat a lot of foods high in solid fats, added sugars, or salt.  Maintain a Healthy Weight Regular exercise can help you achieve or maintain a healthy weight. You should:  Do at least 150 minutes of exercise each week. The exercise should increase your heart rate and make you sweat (moderate-intensity exercise).  Do strength-training exercises at least twice a week.  Watch Your Levels of Cholesterol and Blood Lipids  Have your blood tested for lipids and cholesterol every 5 years starting at 57 years of age. If you are at high risk for heart disease, you should start having your blood tested when you are 57 years old. You may need to have your cholesterol levels checked more often if: ? Your lipid or cholesterol levels are high. ? You are older than 57 years of age. ? You are at high risk for heart disease.  What should I know about cancer screening? Many types of cancers can be detected early and may often be prevented. Lung Cancer  You should be screened every year for lung cancer if: ? You are a current smoker who has smoked for at least 30 years. ? You are a former smoker who has quit within the past 15 years.  Talk to your health care provider  about your screening options, when you should start screening, and how often you should be screened.  Colorectal Cancer  Routine colorectal cancer screening usually begins at 57 years of age and should be repeated every 5-10 years until you are 57 years old. You may need to be screened more often if early forms of precancerous polyps or small growths are found. Your health care provider may recommend screening at an earlier age if you have risk factors for colon cancer.  Your health care provider may recommend using home test kits to check for hidden blood in the stool.  A small camera at the end of a tube can be used to examine your colon (sigmoidoscopy or colonoscopy). This checks for the earliest forms of colorectal cancer.  Prostate and Testicular Cancer  Depending on your age and overall health, your health care provider may do certain tests to screen for prostate and testicular cancer.  Talk to your health care provider about any symptoms or concerns you have about testicular or prostate cancer.  Skin Cancer  Check your skin from head to toe regularly.  Tell your health care provider about any new moles or changes in moles, especially if: ? There is a change in a mole's size, shape, or color. ? You have a mole that is larger than a pencil eraser.  Always use sunscreen. Apply sunscreen liberally and repeat throughout the day.  Protect yourself by wearing  long sleeves, pants, a wide-brimmed hat, and sunglasses when outside.  What should I know about heart disease, diabetes, and high blood pressure?  If you are 20-74 years of age, have your blood pressure checked every 3-5 years. If you are 52 years of age or older, have your blood pressure checked every year. You should have your blood pressure measured twice-once when you are at a hospital or clinic, and once when you are not at a hospital or clinic. Record the average of the two measurements. To check your blood pressure when you  are not at a hospital or clinic, you can use: ? An automated blood pressure machine at a pharmacy. ? A home blood pressure monitor.  Talk to your health care provider about your target blood pressure.  If you are between 45-51 years old, ask your health care provider if you should take aspirin to prevent heart disease.  Have regular diabetes screenings by checking your fasting blood sugar level. ? If you are at a normal weight and have a low risk for diabetes, have this test once every three years after the age of 36. ? If you are overweight and have a high risk for diabetes, consider being tested at a younger age or more often.  A one-time screening for abdominal aortic aneurysm (AAA) by ultrasound is recommended for men aged 54-75 years who are current or former smokers. What should I know about preventing infection? Hepatitis B If you have a higher risk for hepatitis B, you should be screened for this virus. Talk with your health care provider to find out if you are at risk for hepatitis B infection. Hepatitis C Blood testing is recommended for:  Everyone born from [birth year] through 1966.  Anyone with known risk factors for hepatitis C.  Sexually Transmitted Diseases (STDs)  You should be screened each year for STDs including gonorrhea and chlamydia if: ? You are sexually active and are younger than 57 years of age. ? You are older than 57 years of age and your health care provider tells you that you are at risk for this type of infection. ? Your sexual activity has changed since you were last screened and you are at an increased risk for chlamydia or gonorrhea. Ask your health care provider if you are at risk.  Talk with your health care provider about whether you are at high risk of being infected with HIV. Your health care provider may recommend a prescription medicine to help prevent HIV infection.  What else can I do?  Schedule regular health, dental, and eye exams.  Stay  current with your vaccines (immunizations).  Do not use any tobacco products, such as cigarettes, chewing tobacco, and e-cigarettes. If you need help quitting, ask your health care provider.  Limit alcohol intake to no more than 2 drinks per day. One drink equals 12 ounces of beer, 5 ounces of wine, or 1 ounces of hard liquor.  Do not use street drugs.  Do not share needles.  Ask your health care provider for help if you need support or information about quitting drugs.  Tell your health care provider if you often feel depressed.  Tell your health care provider if you have ever been abused or do not feel safe at home. This information is not intended to replace advice given to you by your health care provider. Make sure you discuss any questions you have with your health care provider. Document Released: 05/19/2008 Document Revised: 07/20/2016 Document Reviewed:  08/25/2015 Elsevier Interactive Patient Education  [employer].

## 2017-11-22 NOTE — Assessment & Plan Note (Signed)
Preventative protocols reviewed and updated unless pt declined. Discussed healthy diet and lifestyle.  

## 2017-11-22 NOTE — Assessment & Plan Note (Signed)
New diagnosis. Reviewed with patient. Diet controlled range. Evidence of cataracts on exam. Reviewed diabetic diet to help control sugars. RTC 4 mo f/u visit. Pt agrees with plan.

## 2018-02-22 ENCOUNTER — Encounter: Payer: Self-pay | Admitting: Family Medicine

## 2018-02-22 ENCOUNTER — Ambulatory Visit: Payer: BLUE CROSS/BLUE SHIELD | Admitting: Family Medicine

## 2018-02-22 VITALS — BP 138/82 | HR 60 | Temp 97.9°F | Wt 208.0 lb

## 2018-02-22 DIAGNOSIS — E1136 Type 2 diabetes mellitus with diabetic cataract: Secondary | ICD-10-CM | POA: Diagnosis not present

## 2018-02-22 DIAGNOSIS — Z23 Encounter for immunization: Secondary | ICD-10-CM

## 2018-02-22 DIAGNOSIS — F411 Generalized anxiety disorder: Secondary | ICD-10-CM | POA: Diagnosis not present

## 2018-02-22 DIAGNOSIS — I10 Essential (primary) hypertension: Secondary | ICD-10-CM

## 2018-02-22 MED ORDER — ALPRAZOLAM 0.5 MG PO TBDP: 0.5000 mg | ORAL_TABLET | Freq: Every day | ORAL | 0 refills | Status: DC | PRN

## 2018-02-22 MED ORDER — VALSARTAN-HYDROCHLOROTHIAZIDE 160-12.5 MG PO TABS: 1.0000 | ORAL_TABLET | Freq: Every day | ORAL | 1 refills | Status: DC

## 2018-02-22 NOTE — Assessment & Plan Note (Signed)
Stable period on celexa 20mg  daily. Will fill xanax 0.5mg  PRN plane rides etc.

## 2018-02-22 NOTE — Patient Instructions (Addendum)
pneuomvax today Let's transition blood pressure medicine back to valsartan/hctz 160/12.5mg  daily. This is a bit lower dose than you were on previously.  See eye doctor yearly.  Return in 4-6 months for follow up visit.

## 2018-02-22 NOTE — Assessment & Plan Note (Signed)
Chronic, stable. Discussed ideally tighter control - will transition back to valsartan hctz which worked better, but start at lower dose 160/12.5mg  daily. Previously discontinued due to concerns over recall.

## 2018-02-22 NOTE — Assessment & Plan Note (Signed)
Discussed diabetes and management strategies as well as importance of tight blood pressure, cholesterol control. Declines referral for DSME at this time. Foot exam today. RTC 4-22mo f/u visit.

## 2018-02-22 NOTE — Progress Notes (Signed)
BP 138/82 (BP Location: Left Arm, Patient Position: Sitting, Cuff Size: Normal)   Pulse 60   Temp 97.9 F (36.6 C) (Oral)   Wt 208 lb (94.3 kg)   SpO2 95%   BMI 32.58 kg/m    CC: DM f/u Subjective:    Patient ID: Jermaine Harris, male    DOB: [birth year], 57 y.o.   MRN: [id number]  HPI: Reynald Woods is a 57 y.o. male presenting on 02/22/2018 for Diabetes (Here for follow-up.) and Hypertension (Concerned about losartan recall.)   Recent trip to [place] - had a good trip.   HTN - home BPs running mildly elevated. No HA, vision changes, CP/tightness, SOB, leg swelling.   DM - does not regularly check sugars. Compliant with antihyperglycemic regimen which includes: diet controlled. Denies low sugars or hypoglycemic symptoms. Denies paresthesias. Last diabetic eye exam due. Pneumovax: due. Prevnar: not due. Glucometer brand: doesn't have one. DSME: declines. Lab Results  Component Value Date   HGBA1C 6.6 (H) 11/16/2017   Diabetic Foot Exam - Simple   Simple Foot Form Diabetic Foot exam was performed with the following findings:  Yes 02/22/2018 12:37 PM  Visual Inspection No deformities, no ulcerations, no other skin breakdown bilaterally:  Yes Sensation Testing Intact to touch and monofilament testing bilaterally:  Yes Pulse Check Posterior Tibialis and Dorsalis pulse intact bilaterally:  Yes Comments    Lab Results  Component Value Date   MICROALBUR 1.1 03/31/2015     On xanax for airplane rides - requests refill. Last filled #[id number] 2013.   Relevant past medical, surgical, family and social history reviewed and updated as indicated. Interim medical history since our last visit reviewed. Allergies and medications reviewed and updated. Outpatient Medications Prior to Visit  Medication Sig Dispense Refill  . citalopram (CELEXA) 20 MG tablet Take 1 tablet (20 mg total) by mouth daily. 90 tablet 3  . gabapentin (NEURONTIN) 300 MG capsule Take 1 capsule (300 mg total) by  mouth at bedtime. 90 capsule 3  . ibuprofen (ADVIL,MOTRIN) 200 MG tablet Take 200 mg by mouth as needed.    . sildenafil (VIAGRA) 100 MG tablet Take 0.5-1 tablets (50-100 mg total) by mouth daily as needed for erectile dysfunction. 10 tablet 3  . losartan (COZAAR) 100 MG tablet Take 1 tablet (100 mg total) by mouth daily. 90 tablet 3   No facility-administered medications prior to visit.      Per HPI unless specifically indicated in ROS section below Review of Systems     Objective:    BP 138/82 (BP Location: Left Arm, Patient Position: Sitting, Cuff Size: Normal)   Pulse 60   Temp 97.9 F (36.6 C) (Oral)   Wt 208 lb (94.3 kg)   SpO2 95%   BMI 32.58 kg/m   Wt Readings from Last 3 Encounters:  02/22/18 208 lb (94.3 kg)  11/22/17 208 lb (94.3 kg)  07/28/17 211 lb (95.7 kg)    Physical Exam  Constitutional: He appears well-developed and well-nourished. No distress.  HENT:  Head: Normocephalic and atraumatic.  Right Ear: External ear normal.  Left Ear: External ear normal.  Nose: Nose normal.  Mouth/Throat: Oropharynx is clear and moist. No oropharyngeal exudate.  Eyes: Pupils are equal, round, and reactive to light. Conjunctivae and EOM are normal. No scleral icterus.  Neck: Normal range of motion. Neck supple.  Cardiovascular: Normal rate, regular rhythm, normal heart sounds and intact distal pulses.  No murmur heard. Pulmonary/Chest: Effort normal and breath sounds normal.  No respiratory distress. He has no wheezes. He has no rales.  Musculoskeletal: He exhibits no edema.  See HPI for foot exam if done  Lymphadenopathy:    He has no cervical adenopathy.  Skin: Skin is warm and dry. No rash noted.  Psychiatric: He has a normal mood and affect.  Nursing note and vitals reviewed.  Results for orders placed or performed in visit on 11/16/17  Hepatitis C antibody  Result Value Ref Range   Hepatitis C Ab NON-REACTIVE NON-REACTI   SIGNAL TO CUT-OFF 0.01 <1.00  PSA    Result Value Ref Range   PSA 0.54 0.10 - 4.00 ng/mL  Hemoglobin A1c  Result Value Ref Range   Hgb A1c MFr Bld 6.6 (H) 4.6 - 6.5 %  TSH  Result Value Ref Range   TSH 2.75 0.35 - 4.50 uIU/mL  Comprehensive metabolic panel  Result Value Ref Range   Sodium 139 135 - 145 mEq/L   Potassium 4.4 3.5 - 5.1 mEq/L   Chloride 104 96 - 112 mEq/L   CO2 30 19 - 32 mEq/L   Glucose, Bld 123 (H) 70 - 99 mg/dL   BUN 21 6 - 23 mg/dL   Creatinine, Ser 1.26 0.40 - 1.50 mg/dL   Total Bilirubin 0.4 0.2 - 1.2 mg/dL   Alkaline Phosphatase 59 39 - 117 U/L   AST 18 0 - 37 U/L   ALT 29 0 - 53 U/L   Total Protein 6.7 6.0 - 8.3 g/dL   Albumin 4.3 3.5 - 5.2 g/dL   Calcium 9.1 8.4 - 10.5 mg/dL   GFR 62.55 >60.00 mL/min  Lipid panel  Result Value Ref Range   Cholesterol 140 0 - 200 mg/dL   Triglycerides 259.0 (H) 0.0 - 149.0 mg/dL   HDL 31.50 (L) >39.00 mg/dL   VLDL 51.8 (H) 0.0 - 40.0 mg/dL   Total CHOL/HDL Ratio 4    NonHDL 108.70   LDL cholesterol, direct  Result Value Ref Range   Direct LDL 90.0 mg/dL      Assessment & Plan:   Problem List Items Addressed This Visit    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 Declines referral for DSME at this time. Foot exam today. RTC 4-61mo f/u visit.       Relevant Medications   valsartan-hydrochlorothiazide (DIOVAN-HCT) 160-12.5 MG tablet   Essential hypertension    Chronic, stable. Discussed ideally tighter control - will transition back to valsartan hctz which worked better, but start at lower dose 160/12.5mg  daily. Previously discontinued due to concerns over recall.       Relevant Medications   valsartan-hydrochlorothiazide (DIOVAN-HCT) 160-12.5 MG tablet   GAD (generalized anxiety disorder)    Stable period on celexa 20mg  daily. Will fill xanax 0.5mg   PRN plane rides etc.       Relevant Medications   ALPRAZolam (NIRAVAM) 0.5 MG dissolvable tablet    Other Visit Diagnoses    Need for 23-polyvalent pneumococcal polysaccharide vaccine       Relevant Orders   Pneumococcal polysaccharide vaccine 23-valent greater than or equal to 2yo subcutaneous/IM (Completed)       Meds ordered this encounter  Medications  . valsartan-hydrochlorothiazide (DIOVAN-HCT) 160-12.5 MG tablet    Sig: Take 1 tablet by mouth daily.    Dispense:  90 tablet    Refill:  1  . DISCONTD: ALPRAZolam (NIRAVAM) 0.5 MG dissolvable tablet    Sig: Take 1 tablet (0.5 mg total) by mouth daily as needed for anxiety (plane rides).    Dispense:  20 tablet    Refill:  0  . ALPRAZolam (NIRAVAM) 0.5 MG dissolvable tablet    Sig: Take 1 tablet (0.5 mg total) by mouth daily as needed for anxiety (plane rides).    Dispense:  20 tablet    Refill:  0   Orders Placed This Encounter  Procedures  . Pneumococcal polysaccharide vaccine 23-valent greater than or equal to 2yo subcutaneous/IM    Follow up plan: Return in about 4 months (around 06/24/2018) for follow up visit.  Ria Bush, MD

## 2018-07-23 ENCOUNTER — Ambulatory Visit: Payer: BLUE CROSS/BLUE SHIELD | Admitting: Family Medicine

## 2018-07-23 ENCOUNTER — Encounter: Payer: Self-pay | Admitting: Family Medicine

## 2018-07-23 VITALS — BP 130/86 | HR 76 | Temp 98.4°F | Ht 67.0 in | Wt 206.5 lb

## 2018-07-23 DIAGNOSIS — E1136 Type 2 diabetes mellitus with diabetic cataract: Secondary | ICD-10-CM

## 2018-07-23 DIAGNOSIS — R252 Cramp and spasm: Secondary | ICD-10-CM

## 2018-07-23 DIAGNOSIS — I10 Essential (primary) hypertension: Secondary | ICD-10-CM

## 2018-07-23 LAB — POCT GLYCOSYLATED HEMOGLOBIN (HGB A1C): Hemoglobin A1C: 6.2 % — AB (ref 4.0–5.6)

## 2018-07-23 NOTE — Assessment & Plan Note (Signed)
Anticipate diuretic, summer heat, h/o L leg numbness all contributing to symptoms. Supportive care reviewed including regular stretching, good hydration status, consider electrolyte supplementation (ie gatorade).

## 2018-07-23 NOTE — Progress Notes (Signed)
BP 130/86 (BP Location: Right Arm, Patient Position: Sitting, Cuff Size: Normal)   Pulse 76   Temp 98.4 F (36.9 C) (Oral)   Ht 5\' 7"  (1.702 m)   Wt 206 lb 8 oz (93.7 kg)   SpO2 95%   BMI 32.34 kg/m    CC: DM f/u visit Subjective:    Patient ID: Jermaine Harris, male    DOB: 1961/01/21, 57 y.o.   MRN: [id number]  HPI: Teodoro Jeffreys is a 57 y.o. male presenting on 07/23/2018 for Diabetes (Here for f/u.)   Just moved back to [place].  Was in [place] for summer - really enjoyed trip.  Worsening L leg cramps.  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   HTN - back on valsartan hctz 160/12.5mg  daily. No HA, vision changes, CP/tightness, SOB, leg swelling. Good readings at home.   DM - does not regularly check sugars. Compliant with antihyperglycemic regimen which includes: diet control. Denies low sugars or hypoglycemic symptoms. Denies paresthesias. Last diabetic eye exam due. Pneumovax: 02/2018. Prevnar: not due. Glucometer brand: doesn't have one. DSME: declines. Has joined [employer] - walking 9 holes 3 times a week.  Lab Results  Component Value Date   HGBA1C 6.2 (A) 07/23/2018   Diabetic Foot Exam - Simple   No data filed     Lab Results  Component Value Date   MICROALBUR 1.1 03/31/2015     Relevant past medical, surgical, family and social history reviewed and updated as indicated. Interim medical history since our last visit reviewed. Allergies and medications reviewed and updated. Outpatient Medications Prior to Visit  Medication Sig Dispense Refill  . ALPRAZolam (NIRAVAM) 0.5 MG dissolvable tablet Take 1 tablet (0.5 mg total) by mouth daily as needed for anxiety (plane rides). 20 tablet 0  . citalopram (CELEXA) 20 MG tablet Take 1 tablet (20 mg total) by mouth daily. 90 tablet 3  . gabapentin (NEURONTIN) 300 MG capsule Take 1 capsule (300 mg total) by mouth at bedtime. 90 capsule 3  . ibuprofen (ADVIL,MOTRIN) 200 MG tablet Take 200 mg by mouth as  needed.    . sildenafil (VIAGRA) 100 MG tablet Take 0.5-1 tablets (50-100 mg total) by mouth daily as needed for erectile dysfunction. 10 tablet 3  . valsartan-hydrochlorothiazide (DIOVAN-HCT) 160-12.5 MG tablet Take 1 tablet by mouth daily. 90 tablet 1   No facility-administered medications prior to visit.      Per HPI unless specifically indicated in ROS section below Review of Systems     Objective:    BP 130/86 (BP Location: Right Arm, Patient Position: Sitting, Cuff Size: Normal)   Pulse 76   Temp 98.4 F (36.9 C) (Oral)   Ht 5\' 7"  (1.702 m)   Wt 206 lb 8 oz (93.7 kg)   SpO2 95%   BMI 32.34 kg/m   Wt Readings from Last 3 Encounters:  07/23/18 206 lb 8 oz (93.7 kg)  02/22/18 208 lb (94.3 kg)  11/22/17 208 lb (94.3 kg)    Physical Exam  Constitutional: He appears well-developed and well-nourished. No distress.  HENT:  Mouth/Throat: Oropharynx is clear and moist. No oropharyngeal exudate.  Cardiovascular: Normal rate, regular rhythm and normal heart sounds.  No murmur heard. Pulmonary/Chest: Effort normal and breath sounds normal. No respiratory distress. He has no wheezes. He has no rales.  Musculoskeletal: He exhibits no edema.  Nursing note and vitals reviewed.  Results for orders placed or performed in visit on 07/23/18  POCT  glycosylated hemoglobin (Hb A1C)  Result Value Ref Range   Hemoglobin A1C 6.2 (A) 4.0 - 5.6 %   HbA1c POC (<> result, manual entry)     HbA1c, POC (prediabetic range)     HbA1c, POC (controlled diabetic range)     Lab Results  Component Value Date   CREATININE 1.26 11/16/2017   BUN 21 11/16/2017   NA 139 11/16/2017   K 4.4 11/16/2017   CL 104 11/16/2017   CO2 30 11/16/2017       Assessment & Plan:   Problem List Items Addressed This Visit    Leg cramping    Anticipate diuretic, summer heat, h/o L leg numbness all contributing to symptoms. Supportive care reviewed including regular stretching, good hydration status, consider  electrolyte supplementation (ie gatorade).       Essential hypertension    Chronic, stable. Continue current regimen. 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 A1c now in prediabetes range. Congratulated. Feels this is sustainable.       Relevant Orders   POCT glycosylated hemoglobin (Hb A1C) (Completed)       No orders of the defined types were placed in this encounter.  Orders Placed This Encounter  Procedures  . POCT glycosylated hemoglobin (Hb A1C)    Follow up plan: Return in about 4 months (around 11/22/2018) for annual exam, prior fasting for blood work.  Ria Bush, MD

## 2018-07-23 NOTE — Assessment & Plan Note (Signed)
Has implemented walking routine - walking 9 holes golfing several times a week, joined golf club. A1c now in prediabetes range. Congratulated. Feels this is sustainable.

## 2018-07-23 NOTE — Assessment & Plan Note (Addendum)
Chronic, stable. Continue current regimen. Doing well with valsartan /hctz 160/12.5mg  regimen

## 2018-07-23 NOTE — Patient Instructions (Signed)
You are doing well today - congratulations on sugar control! Continue current regimen and exercise routine. Return as needed or in 4 months for physical.

## 2018-08-18 ENCOUNTER — Other Ambulatory Visit: Payer: Self-pay | Admitting: Family Medicine

## 2018-11-16 ENCOUNTER — Other Ambulatory Visit: Payer: Self-pay | Admitting: Family Medicine

## 2018-12-10 ENCOUNTER — Encounter: Payer: BLUE CROSS/BLUE SHIELD | Admitting: Family Medicine

## 2018-12-27 ENCOUNTER — Other Ambulatory Visit: Payer: Self-pay | Admitting: Family Medicine

## 2018-12-27 DIAGNOSIS — Z125 Encounter for screening for malignant neoplasm of prostate: Secondary | ICD-10-CM

## 2018-12-27 DIAGNOSIS — E1136 Type 2 diabetes mellitus with diabetic cataract: Secondary | ICD-10-CM

## 2018-12-28 ENCOUNTER — Other Ambulatory Visit (INDEPENDENT_AMBULATORY_CARE_PROVIDER_SITE_OTHER): Payer: BLUE CROSS/BLUE SHIELD

## 2018-12-28 DIAGNOSIS — E1136 Type 2 diabetes mellitus with diabetic cataract: Secondary | ICD-10-CM

## 2018-12-28 DIAGNOSIS — Z125 Encounter for screening for malignant neoplasm of prostate: Secondary | ICD-10-CM | POA: Diagnosis not present

## 2018-12-28 LAB — COMPREHENSIVE METABOLIC PANEL
ALT: 43 U/L (ref 0–53)
AST: 21 U/L (ref 0–37)
Albumin: 4.2 g/dL (ref 3.5–5.2)
Alkaline Phosphatase: 54 U/L (ref 39–117)
BUN: 21 mg/dL (ref 6–23)
CO2: 28 mEq/L (ref 19–32)
Calcium: 9.2 mg/dL (ref 8.4–10.5)
Chloride: 105 mEq/L (ref 96–112)
Creatinine, Ser: 1.35 mg/dL (ref 0.40–1.50)
GFR: 54.14 mL/min — ABNORMAL LOW (ref >60.00)
Glucose, Bld: 122 mg/dL — ABNORMAL HIGH (ref 70–99)
Potassium: 3.9 mEq/L (ref 3.5–5.1)
Sodium: 140 mEq/L (ref 135–145)
Total Bilirubin: 0.3 mg/dL (ref 0.2–1.2)
Total Protein: 6.6 g/dL (ref 6.0–8.3)

## 2018-12-28 LAB — LIPID PANEL
Cholesterol: 142 mg/dL (ref 0–200)
HDL: 32.6 mg/dL — ABNORMAL LOW (ref >39.00)
LDL Cholesterol: 80 mg/dL (ref 0–99)
NonHDL: 109.49
Total CHOL/HDL Ratio: 4
Triglycerides: 147 mg/dL (ref 0.0–149.0)
VLDL: 29.4 mg/dL (ref 0.0–40.0)

## 2018-12-28 LAB — PSA: PSA: 0.55 ng/mL (ref 0.10–4.00)

## 2018-12-28 LAB — HEMOGLOBIN A1C: Hgb A1c MFr Bld: 6.6 % — ABNORMAL HIGH (ref 4.6–6.5)

## 2019-01-02 ENCOUNTER — Ambulatory Visit (INDEPENDENT_AMBULATORY_CARE_PROVIDER_SITE_OTHER): Payer: BLUE CROSS/BLUE SHIELD | Admitting: Family Medicine

## 2019-01-02 ENCOUNTER — Encounter: Payer: Self-pay | Admitting: Family Medicine

## 2019-01-02 VITALS — BP 130/86 | HR 79 | Temp 98.4°F | Ht 67.25 in | Wt 208.5 lb

## 2019-01-02 DIAGNOSIS — R200 Anesthesia of skin: Secondary | ICD-10-CM

## 2019-01-02 DIAGNOSIS — E669 Obesity, unspecified: Secondary | ICD-10-CM | POA: Diagnosis not present

## 2019-01-02 DIAGNOSIS — Z23 Encounter for immunization: Secondary | ICD-10-CM

## 2019-01-02 DIAGNOSIS — I10 Essential (primary) hypertension: Secondary | ICD-10-CM

## 2019-01-02 DIAGNOSIS — Z1211 Encounter for screening for malignant neoplasm of colon: Secondary | ICD-10-CM

## 2019-01-02 DIAGNOSIS — Z0000 Encounter for general adult medical examination without abnormal findings: Secondary | ICD-10-CM | POA: Diagnosis not present

## 2019-01-02 DIAGNOSIS — E1136 Type 2 diabetes mellitus with diabetic cataract: Secondary | ICD-10-CM

## 2019-01-02 MED ORDER — GABAPENTIN 300 MG PO CAPS: 300.0000 mg | ORAL_CAPSULE | Freq: Every day | ORAL | 3 refills | Status: DC

## 2019-01-02 MED ORDER — VALSARTAN-HYDROCHLOROTHIAZIDE 160-12.5 MG PO TABS: 1.0000 | ORAL_TABLET | Freq: Every day | ORAL | 3 refills | Status: DC

## 2019-01-02 MED ORDER — CITALOPRAM HYDROBROMIDE 20 MG PO TABS: 20.0000 mg | ORAL_TABLET | Freq: Every day | ORAL | 3 refills | Status: DC

## 2019-01-02 NOTE — Patient Instructions (Addendum)
Tdap today. Schedule eye doctor appointment as you're due.  We will refer you for colonoscopy in [place]. You are doing well today. Increase water intake.  Return as needed or in 6 months for follow up visit.  Health Maintenance, Male A healthy lifestyle and preventive care is important for your health and wellness. Ask your health care provider about what schedule of regular examinations is right for you. What should I know about weight and diet? Eat a Healthy Diet  Eat plenty of vegetables, fruits, whole grains, low-fat dairy products, and lean protein.  Do not eat a lot of foods high in solid fats, added sugars, or salt.  Maintain a Healthy Weight Regular exercise can help you achieve or maintain a healthy weight. You should:  Do at least 150 minutes of exercise each week. The exercise should increase your heart rate and make you sweat (moderate-intensity exercise).  Do strength-training exercises at least twice a week. Watch Your Levels of Cholesterol and Blood Lipids  Have your blood tested for lipids and cholesterol every 5 years starting at 59 years of age. If you are at high risk for heart disease, you should start having your blood tested when you are 59 years old. You may need to have your cholesterol levels checked more often if: ? Your lipid or cholesterol levels are high. ? You are older than 59 years of age. ? You are at high risk for heart disease. What should I know about cancer screening? Many types of cancers can be detected early and may often be prevented. Lung Cancer  You should be screened every year for lung cancer if: ? You are a current smoker who has smoked for at least 30 years. ? You are a former smoker who has quit within the past 15 years.  Talk to your health care provider about your screening options, when you should start screening, and how often you should be screened. Colorectal Cancer  Routine colorectal cancer screening usually begins at  59 years of age and should be repeated every 5-10 years until you are 59 years old. You may need to be screened more often if early forms of precancerous polyps or small growths are found. Your health care provider may recommend screening at an earlier age if you have risk factors for colon cancer.  Your health care provider may recommend using home test kits to check for hidden blood in the stool.  A small camera at the end of a tube can be used to examine your colon (sigmoidoscopy or colonoscopy). This checks for the earliest forms of colorectal cancer. Prostate and Testicular Cancer  Depending on your age and overall health, your health care provider may do certain tests to screen for prostate and testicular cancer.  Talk to your health care provider about any symptoms or concerns you have about testicular or prostate cancer. Skin Cancer  Check your skin from head to toe regularly.  Tell your health care provider about any new moles or changes in moles, especially if: ? There is a change in a mole's size, shape, or color. ? You have a mole that is larger than a pencil eraser.  Always use sunscreen. Apply sunscreen liberally and repeat throughout the day.  Protect yourself by wearing long sleeves, pants, a wide-brimmed hat, and sunglasses when outside. What should I know about heart disease, diabetes, and high blood pressure?  If you are 69-24 years of age, have your blood pressure checked every 3-5 years. If you  are 22 years of age or older, have your blood pressure checked every year. You should have your blood pressure measured twice-once when you are at a hospital or clinic, and once when you are not at a hospital or clinic. Record the average of the two measurements. To check your blood pressure when you are not at a hospital or clinic, you can use: ? An automated blood pressure machine at a pharmacy. ? A home blood pressure monitor.  Talk to your health care provider about your  target blood pressure.  If you are between 54-3 years old, ask your health care provider if you should take aspirin to prevent heart disease.  Have regular diabetes screenings by checking your fasting blood sugar level. ? If you are at a normal weight and have a low risk for diabetes, have this test once every three years after the age of 86. ? If you are overweight and have a high risk for diabetes, consider being tested at a younger age or more often.  A one-time screening for abdominal aortic aneurysm (AAA) by ultrasound is recommended for men aged 24-75 years who are current or former smokers. What should I know about preventing infection? Hepatitis B If you have a higher risk for hepatitis B, you should be screened for this virus. Talk with your health care provider to find out if you are at risk for hepatitis B infection. Hepatitis C Blood testing is recommended for:  Everyone born from [birth year] through 1966.  Anyone with known risk factors for hepatitis C. Sexually Transmitted Diseases (STDs)  You should be screened each year for STDs including gonorrhea and chlamydia if: ? You are sexually active and are younger than 59 years of age. ? You are older than 59 years of age and your health care provider tells you that you are at risk for this type of infection. ? Your sexual activity has changed since you were last screened and you are at an increased risk for chlamydia or gonorrhea. Ask your health care provider if you are at risk.  Talk with your health care provider about whether you are at high risk of being infected with HIV. Your health care provider may recommend a prescription medicine to help prevent HIV infection. What else can I do?  Schedule regular health, dental, and eye exams.  Stay current with your vaccines (immunizations).  Do not use any tobacco products, such as cigarettes, chewing tobacco, and e-cigarettes. If you need help quitting, ask your health care  provider.  Limit alcohol intake to no more than 2 drinks per day. One drink equals 12 ounces of beer, 5 ounces of wine, or 1 ounces of hard liquor.  Do not use street drugs.  Do not share needles.  Ask your health care provider for help if you need support or information about quitting drugs.  Tell your health care provider if you often feel depressed.  Tell your health care provider if you have ever been abused or do not feel safe at home. This information is not intended to replace advice given to you by your health care provider. Make sure you discuss any questions you have with your health care provider. Document Released: 05/19/2008 Document Revised: 07/20/2016 Document Reviewed: 08/25/2015 Elsevier Interactive Patient Education  2019 [employer].

## 2019-01-02 NOTE — Assessment & Plan Note (Signed)
Encouraged healthy diet and lifestyle changes to affect sustainable weight loss. Pt motivated to restart exercise regimen.

## 2019-01-02 NOTE — Assessment & Plan Note (Signed)
Chronic L leg numbness since surgery 2016

## 2019-01-02 NOTE — Assessment & Plan Note (Signed)
Chronic, stable. Encouraged he schedule eye exam.  RTC 6 mo DM f/u visit.

## 2019-01-02 NOTE — Assessment & Plan Note (Signed)
Chronic, stable. Continue current regimen. 

## 2019-01-02 NOTE — Assessment & Plan Note (Signed)
Preventative protocols reviewed and updated unless pt declined. Discussed healthy diet and lifestyle.  

## 2019-01-02 NOTE — Progress Notes (Signed)
BP 130/86 (BP Location: Left Arm, Patient Position: Sitting, Cuff Size: Normal)   Pulse 79   Temp 98.4 F (36.9 C) (Oral)   Ht 5' 7.25" (1.708 m)   Wt 208 lb 8 oz (94.6 kg)   SpO2 96%   BMI 32.41 kg/m    CC: CPE Subjective:    Patient ID: Jermaine Harris, male    DOB: 01/24/1961, 58 y.o.   MRN: [id number]  HPI: Jermaine Harris is a 58 y.o. male presenting on 01/02/2019 for Annual Exam (Pt mentioned that his mom just passed away. )   Trip to [place] last year (sabbatical). Mother passed away last week - pt found out on trip to [place] ([place] and [place]). Mother was in memory care unit in [place].  Gabapentin effective for neuropathic pain. Persistent L leg numbness.   Preventative: COLONOSCOPY Date: 2012 mild diverticulosis, rec rpt 5 yrs 2/2 fmhx polyps ([place]). Due. Requests [place].  Prostate cancer screening - yearly screening. Nocturia x1, some urinary urgency.  Flu shot yearly Pneumovax 2019 Td 2010, Tdap today Got MMR at school this fall.  Seat belt use discussed Sunscreen use discussed, no changing moles on skin Non smoker Alcohol - 1 glass wine a few times a week Dentist q6 mo Eye exam yearly - due  Divorced from wife who was having affair (2015) Lives alone, 2 cats Occupation: Paramedic Leisure centre manager) Edu: PhD Activity: golf, walking, yardwork Diet: good water, vegetables daily (salads)      Relevant past medical, surgical, family and social history reviewed and updated as indicated. Interim medical history since our last visit reviewed. Allergies and medications reviewed and updated. Outpatient Medications Prior to Visit  Medication Sig Dispense Refill  . ALPRAZolam (NIRAVAM) 0.5 MG dissolvable tablet Take 1 tablet (0.5 mg total) by mouth daily as needed for anxiety (plane rides). 20 tablet 0  . ibuprofen (ADVIL,MOTRIN) 200 MG tablet Take 200 mg by mouth as needed.    . citalopram (CELEXA) 20 MG tablet TAKE 1 TABLET BY  MOUTH EVERY DAY 90 tablet 0  . gabapentin (NEURONTIN) 300 MG capsule Take 1 capsule (300 mg total) by mouth at bedtime. 90 capsule 3  . sildenafil (VIAGRA) 100 MG tablet Take 0.5-1 tablets (50-100 mg total) by mouth daily as needed for erectile dysfunction. 10 tablet 3  . valsartan-hydrochlorothiazide (DIOVAN-HCT) 160-12.5 MG tablet TAKE 1 TABLET BY MOUTH EVERY DAY 90 tablet 1   No facility-administered medications prior to visit.      Per HPI unless specifically indicated in ROS section below Review of Systems  Constitutional: Negative for activity change, appetite change, chills, fatigue, fever and unexpected weight change.  HENT: Negative for hearing loss.   Eyes: Negative for visual disturbance.  Respiratory: Negative for cough, chest tightness, shortness of breath and wheezing.   Cardiovascular: Negative for chest pain, palpitations and leg swelling.  Gastrointestinal: Positive for constipation (during travel). Negative for abdominal distention, abdominal pain, blood in stool, diarrhea, nausea and vomiting.  Genitourinary: Negative for difficulty urinating and hematuria.  Musculoskeletal: Negative for arthralgias, myalgias and neck pain.  Skin: Negative for rash.  Neurological: Negative for dizziness, seizures, syncope and headaches.  Hematological: Negative for adenopathy. Does not bruise/bleed easily.  Psychiatric/Behavioral: Negative for dysphoric mood. The patient is not nervous/anxious.    Objective:    BP 130/86 (BP Location: Left Arm, Patient Position: Sitting, Cuff Size: Normal)   Pulse 79   Temp 98.4 F (36.9 C) (Oral)   Ht 5' 7.25" (1.708  m)   Wt 208 lb 8 oz (94.6 kg)   SpO2 96%   BMI 32.41 kg/m   Wt Readings from Last 3 Encounters:  01/02/19 208 lb 8 oz (94.6 kg)  07/23/18 206 lb 8 oz (93.7 kg)  02/22/18 208 lb (94.3 kg)    Physical Exam Vitals signs and nursing note reviewed.  Constitutional:      General: He is not in acute distress.    Appearance: Normal  appearance. He is well-developed.  HENT:     Head: Normocephalic and atraumatic.     Right Ear: Hearing, tympanic membrane, ear canal and external ear normal.     Left Ear: Hearing, tympanic membrane, ear canal and external ear normal.     Nose: Nose normal.     Mouth/Throat:     Mouth: Mucous membranes are moist.     Pharynx: Oropharynx is clear. Uvula midline. No oropharyngeal exudate or posterior oropharyngeal erythema.  Eyes:     General: No scleral icterus.    Conjunctiva/sclera: Conjunctivae normal.     Pupils: Pupils are equal, round, and reactive to light.  Neck:     Musculoskeletal: Normal range of motion and neck supple.     Vascular: No carotid bruit.  Cardiovascular:     Rate and Rhythm: Normal rate and regular rhythm.     Pulses:          Radial pulses are 2+ on the right side and 2+ on the left side.     Heart sounds: Normal heart sounds. No murmur.  Pulmonary:     Effort: Pulmonary effort is normal. No respiratory distress.     Breath sounds: Normal breath sounds. No wheezing or rales.  Abdominal:     General: Bowel sounds are normal. There is no distension.     Palpations: Abdomen is soft. There is no mass.     Tenderness: There is no abdominal tenderness. There is no guarding or rebound.  Musculoskeletal: Normal range of motion.  Lymphadenopathy:     Cervical: No cervical adenopathy.  Skin:    General: Skin is warm and dry.     Findings: No rash.  Neurological:     Mental Status: He is alert and oriented to person, place, and time.     Comments: CN grossly intact, station and gait intact  Psychiatric:        Mood and Affect: Mood normal.        Behavior: Behavior normal.        Thought Content: Thought content normal.        Judgment: Judgment normal.       Results for orders placed or performed in visit on 12/28/18  PSA  Result Value Ref Range   PSA 0.55 0.10 - 4.00 ng/mL  Hemoglobin A1c  Result Value Ref Range   Hgb A1c MFr Bld 6.6 (H) 4.6 - 6.5 %   Lipid panel  Result Value Ref Range   Cholesterol 142 0 - 200 mg/dL   Triglycerides 147.0 0.0 - 149.0 mg/dL   HDL 32.60 (L) >39.00 mg/dL   VLDL 29.4 0.0 - 40.0 mg/dL   LDL Cholesterol 80 0 - 99 mg/dL   Total CHOL/HDL Ratio 4    NonHDL 109.49   Comprehensive metabolic panel  Result Value Ref Range   Sodium 140 135 - 145 mEq/L   Potassium 3.9 3.5 - 5.1 mEq/L   Chloride 105 96 - 112 mEq/L   CO2 28 19 - 32 mEq/L  Glucose, Bld 122 (H) 70 - 99 mg/dL   BUN 21 6 - 23 mg/dL   Creatinine, Ser 1.35 0.40 - 1.50 mg/dL   Total Bilirubin 0.3 0.2 - 1.2 mg/dL   Alkaline Phosphatase 54 39 - 117 U/L   AST 21 0 - 37 U/L   ALT 43 0 - 53 U/L   Total Protein 6.6 6.0 - 8.3 g/dL   Albumin 4.2 3.5 - 5.2 g/dL   Calcium 9.2 8.4 - 10.5 mg/dL   GFR 54.14 (L) >60.00 mL/min   Assessment & Plan:   Problem List Items Addressed This Visit    Obesity, Class I, BMI 30-34.9    Encouraged healthy diet and lifestyle changes to affect sustainable weight loss. Pt motivated to restart exercise regimen.       Left leg numbness    Chronic L leg numbness since surgery 2016      Health maintenance examination - Primary    Preventative protocols reviewed and updated unless pt declined. Discussed healthy diet and lifestyle.       Essential hypertension    Chronic, stable. Continue current regimen.       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 Encouraged he schedule eye exam.  RTC 6 mo DM f/u visit.       Relevant Medications   valsartan-hydrochlorothiazide (DIOVAN-HCT) 160-12.5 MG tablet    Other Visit Diagnoses    Special screening for malignant neoplasms, colon       Relevant Orders   Ambulatory referral to Gastroenterology   Need for Tdap vaccination       Relevant Orders   Tdap vaccine greater than or equal to 7yo IM  (Completed)       Meds ordered this encounter  Medications  . citalopram (CELEXA) 20 MG tablet    Sig: Take 1 tablet (20 mg total) by mouth daily.    Dispense:  90 tablet    Refill:  3  . gabapentin (NEURONTIN) 300 MG capsule    Sig: Take 1 capsule (300 mg total) by mouth at bedtime.    Dispense:  90 capsule    Refill:  3  . valsartan-hydrochlorothiazide (DIOVAN-HCT) 160-12.5 MG tablet    Sig: Take 1 tablet by mouth daily.    Dispense:  90 tablet    Refill:  3   Orders Placed This Encounter  Procedures  . Tdap vaccine greater than or equal to 7yo IM  . Ambulatory referral to Gastroenterology    Referral Priority:   Routine    Referral Type:   Consultation    Referral Reason:   Specialty Services Required    Number of Visits Requested:   1    Follow up plan: Return in about 6 months (around 07/03/2019) for follow up visit.  Ria Bush, MD

## 2019-01-09 ENCOUNTER — Other Ambulatory Visit: Payer: Self-pay

## 2019-01-09 DIAGNOSIS — Z1211 Encounter for screening for malignant neoplasm of colon: Secondary | ICD-10-CM

## 2019-01-24 ENCOUNTER — Telehealth: Payer: Self-pay | Admitting: Gastroenterology

## 2019-01-24 NOTE — Telephone Encounter (Signed)
Pt called & L/M on V/M stating he had an appointment scheduled for 05-08-2019 for a colonoscopy with Dr Bonna Gains. He needs to R/S to 05-29-2019.

## 2019-01-24 NOTE — Telephone Encounter (Signed)
Procedure has been rescheduled to 05/29/19.  Trish in Endoscopy has been notified new instructions will be placed in mail.  Referral updated to reflect date change.  Notified patient of date change via mychart message.  Thanks [employer]

## 2019-03-10 ENCOUNTER — Encounter: Payer: Self-pay | Admitting: Family Medicine

## 2019-03-12 ENCOUNTER — Encounter: Payer: Self-pay | Admitting: Family Medicine

## 2019-03-12 NOTE — Telephone Encounter (Signed)
See pt mychart message. Pt is asking for his wife to establish with Dr Glori Bickers. I touched base with Dr Glori Bickers who would be willing to take on this pt's wife but only once we're seeing new pts back in the office in person. Let me know if questions

## 2019-05-20 ENCOUNTER — Telehealth: Payer: Self-pay | Admitting: *Deleted

## 2019-05-20 MED ORDER — VALSARTAN-HYDROCHLOROTHIAZIDE 320-25 MG PO TABS: 1.0000 | ORAL_TABLET | Freq: Every day | ORAL | 1 refills | Status: DC

## 2019-05-20 NOTE — Telephone Encounter (Signed)
Spoke with pt relaying Dr. G's message.  Pt verbalizes understanding and expresses his thanks.  

## 2019-05-20 NOTE — Telephone Encounter (Signed)
Let's try valsartan hctz 320/25mg  1/2 tab daily. Sent to pharmacy.

## 2019-05-20 NOTE — Telephone Encounter (Signed)
Patient left a voicemail stating that his Valsartan-HCTZ is on back order and needs a substitute sent to the pharmacy for him to get filled when he needs a refill. Pharmacy-[facility]

## 2019-05-21 ENCOUNTER — Telehealth: Payer: Self-pay

## 2019-05-21 DIAGNOSIS — Z1211 Encounter for screening for malignant neoplasm of colon: Secondary | ICD-10-CM

## 2019-05-21 NOTE — Telephone Encounter (Signed)
Patient has been advised of COVID 19 test requirements prior to his Colonoscopy 06/24.  He has been advised to have his COVID19 test on 05/24/19 at [facility] in front of the [facility] between 10:30 am and 12:30 pm.  Thanks Sharyn Lull

## 2019-05-24 ENCOUNTER — Other Ambulatory Visit
Admission: RE | Admit: 2019-05-24 | Discharge: 2019-05-24 | Disposition: A | Discharge status: Home / Self Care | Payer: BC Managed Care – PPO | Source: Ambulatory Visit | Attending: Gastroenterology | Admitting: Gastroenterology

## 2019-05-24 ENCOUNTER — Other Ambulatory Visit: Payer: Self-pay

## 2019-05-24 DIAGNOSIS — Z1159 Encounter for screening for other viral diseases: Secondary | ICD-10-CM | POA: Insufficient documentation

## 2019-05-25 LAB — NOVEL CORONAVIRUS, NAA (HOSP ORDER, SEND-OUT TO REF LAB; TAT 18-24 HRS): SARS-CoV-2, NAA: NOT DETECTED

## 2019-05-29 ENCOUNTER — Ambulatory Visit: Payer: BC Managed Care – PPO | Admitting: Anesthesiology

## 2019-05-29 ENCOUNTER — Ambulatory Visit
Admission: RE | Admit: 2019-05-29 | Discharge: 2019-05-29 | Disposition: A | Discharge status: Home / Self Care | Payer: BC Managed Care – PPO | Attending: Gastroenterology | Admitting: Gastroenterology

## 2019-05-29 ENCOUNTER — Other Ambulatory Visit: Payer: Self-pay

## 2019-05-29 ENCOUNTER — Encounter
Admission: RE | Disposition: A | Discharge status: Home / Self Care | Payer: Self-pay | Source: Home / Self Care | Attending: Gastroenterology

## 2019-05-29 ENCOUNTER — Encounter: Payer: Self-pay | Admitting: *Deleted

## 2019-05-29 DIAGNOSIS — Z1211 Encounter for screening for malignant neoplasm of colon: Secondary | ICD-10-CM | POA: Diagnosis not present

## 2019-05-29 DIAGNOSIS — I10 Essential (primary) hypertension: Secondary | ICD-10-CM | POA: Diagnosis not present

## 2019-05-29 DIAGNOSIS — K635 Polyp of colon: Secondary | ICD-10-CM

## 2019-05-29 DIAGNOSIS — K5730 Diverticulosis of large intestine without perforation or abscess without bleeding: Secondary | ICD-10-CM

## 2019-05-29 DIAGNOSIS — D123 Benign neoplasm of transverse colon: Secondary | ICD-10-CM | POA: Diagnosis not present

## 2019-05-29 DIAGNOSIS — E119 Type 2 diabetes mellitus without complications: Secondary | ICD-10-CM | POA: Diagnosis not present

## 2019-05-29 DIAGNOSIS — Z79899 Other long term (current) drug therapy: Secondary | ICD-10-CM | POA: Insufficient documentation

## 2019-05-29 DIAGNOSIS — F411 Generalized anxiety disorder: Secondary | ICD-10-CM | POA: Insufficient documentation

## 2019-05-29 DIAGNOSIS — K5790 Diverticulosis of intestine, part unspecified, without perforation or abscess without bleeding: Secondary | ICD-10-CM | POA: Diagnosis not present

## 2019-05-29 HISTORY — PX: COLONOSCOPY WITH PROPOFOL: SHX5780

## 2019-05-29 SURGERY — COLONOSCOPY WITH PROPOFOL
Anesthesia: General

## 2019-05-29 MED ORDER — LIDOCAINE HCL (CARDIAC) PF 100 MG/5ML IV SOSY
PREFILLED_SYRINGE | INTRAVENOUS | Status: DC | PRN
  Administered 2019-05-29: 60 mg via INTRAVENOUS

## 2019-05-29 MED ORDER — PROPOFOL 500 MG/50ML IV EMUL
INTRAVENOUS | Status: DC | PRN
  Administered 2019-05-29: 150 ug/kg/min via INTRAVENOUS

## 2019-05-29 MED ORDER — SODIUM CHLORIDE 0.9 % IV SOLN
INTRAVENOUS | Status: DC
  Administered 2019-05-29: 1000 mL via INTRAVENOUS

## 2019-05-29 MED ORDER — PROPOFOL 10 MG/ML IV BOLUS
INTRAVENOUS | Status: DC | PRN
  Administered 2019-05-29: 90 mg via INTRAVENOUS

## 2019-05-29 NOTE — Op Note (Signed)
[facility] Gastroenterology Patient Name: Jermaine Harris Procedure Date: 05/29/2019 10:29 AM MRN: [id number] Account #: [id number] Date of Birth: 1961-09-19 Admit Type: Outpatient Age: 58 Room: [facility] ENDO ROOM 4 Gender: Male Note Status: Finalized Procedure:            Colonoscopy Indications:          Screening for colorectal malignant neoplasm Providers:            Mikal Wisman B. Bonna Gains MD, MD Referring MD:         Ria Bush (Referring MD) Medicines:            Monitored Anesthesia Care Complications:        No immediate complications. Procedure:            Pre-Anesthesia Assessment:                       - ASA Grade Assessment: II - A patient with mild                        systemic disease.                       - Prior to the procedure, a History and Physical was                        performed, and patient medications, allergies and                        sensitivities were reviewed. The patient's tolerance of                        previous anesthesia was reviewed.                       - The risks and benefits of the procedure and the                        sedation options and risks were discussed with the                        patient. All questions were answered and informed                        consent was obtained.                       - Patient identification and proposed procedure were                        verified prior to the procedure by the physician, the                        nurse, the anesthesiologist, the anesthetist and the                        technician. The procedure was verified in the procedure                        room.                       After  obtaining informed consent, the colonoscope was                        passed under direct vision. Throughout the procedure,                        the patient's blood pressure, pulse, and oxygen                        saturations were monitored continuously. The                  Colonoscope was introduced through the anus and                        advanced to the the cecum, identified by appendiceal                        orifice and ileocecal valve. The colonoscopy was                        performed with ease. The patient tolerated the                        procedure well. The quality of the bowel preparation                        was good. Findings:      The perianal and digital rectal examinations were normal.      A 6 mm polyp was found in the transverse colon. The polyp was sessile.       The polyp was removed with a cold snare. Resection and retrieval were       complete.      A few small-mouthed diverticula were found in the sigmoid colon.      The exam was otherwise without abnormality.      The rectum, sigmoid colon, descending colon, transverse colon, ascending       colon and cecum appeared normal.      The retroflexed view of the distal rectum and anal verge was normal and       showed no anal or rectal abnormalities. Impression:           - One 6 mm polyp in the transverse colon, removed with                        a cold snare. Resected and retrieved.                       - Diverticulosis in the sigmoid colon.                       - The examination was otherwise normal.                       - The rectum, sigmoid colon, descending colon,                        transverse colon, ascending colon and cecum are normal.                       - The distal rectum and anal verge  are normal on                        retroflexion view. Recommendation:       - Discharge patient to home (with escort).                       - Advance diet as tolerated.                       - Continue present medications.                       - Await pathology results.                       - Repeat colonoscopy in 5 years.                       - The findings and recommendations were discussed with                        the patient.                        - The findings and recommendations were discussed with                        the patient's family.                       - Return to primary care physician as previously                        scheduled.                       - High fiber diet. Procedure Code(s):    --- Professional ---                       [phone], Colonoscopy, flexible; with removal of tumor(s),                        polyp(s), or other lesion(s) by snare technique Diagnosis Code(s):    --- Professional ---                       Z12.11, Encounter for screening for malignant neoplasm                        of colon                       K63.5, Polyp of colon                       K57.30, Diverticulosis of large intestine without                        perforation or abscess without bleeding CPT copyright 2019 American Medical Association. All rights reserved. The codes documented in this report are preliminary and upon coder review may  be revised to meet current compliance requirements.  Vonda Antigua, MD Margretta Sidle B. Bonna Gains MD, MD 05/29/2019 11:00:04 AM This report has been signed electronically.  Number of Addenda: 0 Note Initiated On: 05/29/2019 10:29 AM Scope Withdrawal Time: 0 hours 12 minutes 42 seconds  Total Procedure Duration: 0 hours 16 minutes 29 seconds  Estimated Blood Loss: Estimated blood loss: none.      [facility]

## 2019-05-29 NOTE — Anesthesia Postprocedure Evaluation (Signed)
Anesthesia Post Note  Patient: Jermaine Harris  Procedure(s) Performed: COLONOSCOPY WITH PROPOFOL (N/A )  Patient location during evaluation: Endoscopy Anesthesia Type: General Level of consciousness: awake and alert Pain management: pain level controlled Vital Signs Assessment: post-procedure vital signs reviewed and stable Respiratory status: spontaneous breathing, nonlabored ventilation, respiratory function stable and patient connected to nasal cannula oxygen Cardiovascular status: blood pressure returned to baseline and stable Postop Assessment: no apparent nausea or vomiting Anesthetic complications: no     Last Vitals:  Vitals:   05/29/19 1110 05/29/19 1120  BP: 120/77 124/81  Pulse: 62 (!) 56  Resp: 13 17  Temp:    SpO2: 97% 98%    Last Pain:  Vitals:   05/29/19 1120  TempSrc:   PainSc: 0-No pain                 Precious Haws Halley Kincer

## 2019-05-29 NOTE — Transfer of Care (Signed)
Immediate Anesthesia Transfer of Care Note  Patient: Jermaine Harris  Procedure(s) Performed: COLONOSCOPY WITH PROPOFOL (N/A )  Patient Location: PACU  Anesthesia Type:General  Level of Consciousness: awake, alert  and oriented  Airway & Oxygen Therapy: Patient Spontanous Breathing and Patient connected to nasal cannula oxygen  Post-op Assessment: Report given to RN and Post -op Vital signs reviewed and stable  Post vital signs: Reviewed and stable  Last Vitals:  Vitals Value Taken Time  BP 106/60 05/29/19 1059  Temp 36.2 C 05/29/19 1059  Pulse 71 05/29/19 1059  Resp 14 05/29/19 1059  SpO2 97 % 05/29/19 1059    Last Pain:  Vitals:   05/29/19 1059  TempSrc:   PainSc: 0-No pain         Complications: No apparent anesthesia complications

## 2019-05-29 NOTE — Anesthesia Preprocedure Evaluation (Signed)
Anesthesia Evaluation  Patient identified by MRN, date of birth, ID band Patient awake    Reviewed: Allergy & Precautions, H&P , NPO status , Patient's Chart, lab work & pertinent test results  History of Anesthesia Complications Negative for: history of anesthetic complications  Airway Mallampati: II  TM Distance: <3 FB Neck ROM: limited    Dental  (+) Chipped   Pulmonary neg pulmonary ROS, neg shortness of breath,           Cardiovascular Exercise Tolerance: Good hypertension, (-) angina(-) Past MI and (-) DOE      Neuro/Psych PSYCHIATRIC DISORDERS negative neurological ROS  negative psych ROS   GI/Hepatic negative GI ROS, Neg liver ROS, neg GERD  ,  Endo/Other  diabetes, Type 2  Renal/GU negative Renal ROS  negative genitourinary   Musculoskeletal   Abdominal   Peds  Hematology negative hematology ROS (+)   Anesthesia Other Findings Past Medical History: No date: Actinic keratosis No date: Claustrophobia No date: GAD (generalized anxiety disorder)     Comment:  claustrophobia, some panic attacks No date: HTN (hypertension) No date: Irritable bowel syndrome 06/2015: Lumbar herniated disc No date: Seasonal allergies  Past Surgical History: No date: COLON SURGERY 2012: COLONOSCOPY     Comment:  mild diverticulosis, rec rpt 5 yrs 2/2 fmhx polyps 07/2015: MICRODISCECTOMY LUMBAR     Comment:  L4/5/S1 [facility]on Gammon) No date: TONSILLECTOMY  BMI    Body Mass Index: 31.17 kg/m      Reproductive/Obstetrics negative OB ROS                             Anesthesia Physical Anesthesia Plan  ASA: III  Anesthesia Plan: General   Post-op Pain Management:    Induction: Intravenous  PONV Risk Score and Plan: Propofol infusion and TIVA  Airway Management Planned: Natural Airway and Nasal Cannula  Additional Equipment:   Intra-op Plan:   Post-operative Plan:   Informed Consent: I have  reviewed the patients History and Physical, chart, labs and discussed the procedure including the risks, benefits and alternatives for the proposed anesthesia with the patient or authorized representative who has indicated his/her understanding and acceptance.     Dental Advisory Given  Plan Discussed with: Anesthesiologist, CRNA and Surgeon  Anesthesia Plan Comments: (Patient consented for risks of anesthesia including but not limited to:  - adverse reactions to medications - risk of intubation if required - damage to teeth, lips or other oral mucosa - sore throat or hoarseness - Damage to heart, brain, lungs or loss of life  Patient voiced understanding.)        Anesthesia Quick Evaluation

## 2019-05-29 NOTE — Anesthesia Post-op Follow-up Note (Signed)
Anesthesia QCDR form completed.        

## 2019-05-29 NOTE — H&P (Signed)
Vonda Antigua, MD [address], [place], [place], [place], [address] [address], [place], [place], [place], [address] Phone: [phone]  Fax: [phone]  Primary Care Physician:  Ria Bush, MD   Pre-Procedure History & Physical: HPI:  Jermaine Harris is a 58 y.o. male is here for a colonoscopy.   Past Medical History:  Diagnosis Date  . Actinic keratosis   . Claustrophobia   . GAD (generalized anxiety disorder)    claustrophobia, some panic attacks  . HTN (hypertension)   . Irritable bowel syndrome   . Lumbar herniated disc 06/2015  . Seasonal allergies     Past Surgical History:  Procedure Laterality Date  . COLON SURGERY    . COLONOSCOPY  2012   mild diverticulosis, rec rpt 5 yrs 2/2 fmhx polyps  . MICRODISCECTOMY LUMBAR  07/2015   L4/5/S1 Trenton Gammon)  . TONSILLECTOMY      Prior to Admission medications   Medication Sig Start Date End Date Taking? Authorizing Provider  citalopram (CELEXA) 20 MG tablet Take 1 tablet (20 mg total) by mouth daily. 01/02/19  Yes Ria Bush, MD  gabapentin (NEURONTIN) 300 MG capsule Take 1 capsule (300 mg total) by mouth at bedtime. 01/02/19  Yes Ria Bush, MD  valsartan-hydrochlorothiazide (DIOVAN-HCT) 160-12.5 MG tablet Take 1 tablet by mouth daily. 01/02/19  Yes Ria Bush, MD  ALPRAZolam (NIRAVAM) 0.5 MG dissolvable tablet Take 1 tablet (0.5 mg total) by mouth daily as needed for anxiety (plane rides). Patient not taking: Reported on 05/29/2019 02/22/18   Ria Bush, MD  ibuprofen (ADVIL,MOTRIN) 200 MG tablet Take 200 mg by mouth as needed.    [provider]  valsartan-hydrochlorothiazide (DIOVAN-HCT) 320-25 MG tablet Take 1 tablet by mouth daily. Patient not taking: Reported on 05/29/2019 05/20/19   Ria Bush, MD    Allergies as of 01/09/2019 - Review Complete 01/02/2019  Allergen Reaction Noted  . Ace inhibitors Cough 06/21/2014    Family History  Problem Relation Age of  Onset  . Heart disease Father        pacer and CHF  . Depression Father   . Transient ischemic attack Mother 58       several  . Depression Mother   . Colon polyps Sister   . Sudden death Maternal Grandfather 20       ?mustard gas    Social History   Socioeconomic History  . Marital status: Married    Spouse name: Not on file  . Number of children: Not on file  . Years of education: Not on file  . Highest education level: Not on file  Occupational History  . Not on file  Social Needs  . Financial resource strain: Not on file  . Food insecurity    Worry: Not on file    Inability: Not on file  . Transportation needs    Medical: Not on file    Non-medical: Not on file  Tobacco Use  . Smoking status: Never Smoker  . Smokeless tobacco: Never Used  Substance and Sexual Activity  . Alcohol use: Yes    Alcohol/week: 0.0 standard drinks    Comment: couple of drinks per week  . Drug use: Never  . Sexual activity: Not on file  Lifestyle  . Physical activity    Days per week: Not on file    Minutes per session: Not on file  . Stress: Not on file  Relationships  . Social connections    Talks on phone: Not on file  Gets together: Not on file    Attends religious service: Not on file    Active member of club or organization: Not on file    Attends meetings of clubs or organizations: Not on file    Relationship status: Not on file  . Intimate partner violence    Fear of current or ex partner: Not on file    Emotionally abused: Not on file    Physically abused: Not on file    Forced sexual activity: Not on file  Other Topics Concern  . 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 Neck:  Supple; no masses or thyromegaly. Lungs:  Clear throughout to auscultation, normal respiratory effort.    Heart:  +S1, +S2, Regular rate and rhythm, No edema. Abdomen:  Soft, nontender and nondistended. Normal bowel sounds, without guarding, and without rebound.   Neurologic:  Alert and  oriented x4;  grossly normal neurologically.  Impression/Plan: Jermaine Harris is here for a colonoscopy to be performed for average risk screening.  Risks, benefits, limitations, and alternatives regarding  colonoscopy have been reviewed with the patient.  Questions have been answered.  All parties agreeable.   Virgel Manifold, MD  05/29/2019, 10:23 AM

## 2019-05-30 ENCOUNTER — Encounter: Payer: Self-pay | Admitting: Gastroenterology

## 2019-05-30 LAB — SURGICAL PATHOLOGY

## 2019-05-31 ENCOUNTER — Encounter: Payer: Self-pay | Admitting: Gastroenterology

## 2019-06-02 ENCOUNTER — Encounter: Payer: Self-pay | Admitting: Family Medicine

## 2019-06-04 DIAGNOSIS — Z03818 Encounter for observation for suspected exposure to other biological agents ruled out: Secondary | ICD-10-CM | POA: Diagnosis not present

## 2019-07-08 ENCOUNTER — Ambulatory Visit: Payer: BLUE CROSS/BLUE SHIELD | Admitting: Family Medicine

## 2019-08-11 ENCOUNTER — Other Ambulatory Visit: Payer: Self-pay | Admitting: Family Medicine

## 2019-08-28 ENCOUNTER — Other Ambulatory Visit: Payer: Self-pay

## 2019-08-28 ENCOUNTER — Ambulatory Visit: Payer: Self-pay

## 2019-08-28 DIAGNOSIS — Z23 Encounter for immunization: Secondary | ICD-10-CM

## 2019-09-09 DIAGNOSIS — F4322 Adjustment disorder with anxiety: Secondary | ICD-10-CM | POA: Diagnosis not present

## 2019-09-16 DIAGNOSIS — F4322 Adjustment disorder with anxiety: Secondary | ICD-10-CM | POA: Diagnosis not present

## 2019-09-20 ENCOUNTER — Other Ambulatory Visit: Payer: Self-pay | Admitting: Family Medicine

## 2019-09-20 DIAGNOSIS — Z20822 Contact with and (suspected) exposure to covid-19: Secondary | ICD-10-CM

## 2019-09-21 LAB — NOVEL CORONAVIRUS, NAA: SARS-CoV-2, NAA: NOT DETECTED

## 2019-09-30 DIAGNOSIS — F4322 Adjustment disorder with anxiety: Secondary | ICD-10-CM | POA: Diagnosis not present

## 2019-10-24 DIAGNOSIS — F4322 Adjustment disorder with anxiety: Secondary | ICD-10-CM | POA: Diagnosis not present

## 2019-11-20 ENCOUNTER — Other Ambulatory Visit: Payer: BC Managed Care – PPO

## 2019-11-22 ENCOUNTER — Other Ambulatory Visit: Payer: BC Managed Care – PPO

## 2019-12-31 ENCOUNTER — Ambulatory Visit: Payer: BC Managed Care – PPO | Attending: Internal Medicine

## 2019-12-31 DIAGNOSIS — Z20822 Contact with and (suspected) exposure to covid-19: Secondary | ICD-10-CM | POA: Diagnosis not present

## 2020-01-01 LAB — SPECIMEN STATUS REPORT

## 2020-01-01 LAB — NOVEL CORONAVIRUS, NAA: SARS-CoV-2, NAA: NOT DETECTED

## 2020-01-03 ENCOUNTER — Other Ambulatory Visit: Payer: Self-pay | Admitting: Family Medicine

## 2020-01-03 NOTE — Telephone Encounter (Signed)
Gabapentin Last filled:  11/12/19, #90 Last OV:  01/02/19, CPE Next OV:  none

## 2020-01-16 ENCOUNTER — Ambulatory Visit: Payer: BC Managed Care – PPO

## 2020-02-17 DIAGNOSIS — Z20828 Contact with and (suspected) exposure to other viral communicable diseases: Secondary | ICD-10-CM | POA: Diagnosis not present

## 2020-02-17 DIAGNOSIS — Z03818 Encounter for observation for suspected exposure to other biological agents ruled out: Secondary | ICD-10-CM | POA: Diagnosis not present

## 2020-02-17 DIAGNOSIS — R509 Fever, unspecified: Secondary | ICD-10-CM | POA: Diagnosis not present

## 2020-02-17 DIAGNOSIS — R519 Headache, unspecified: Secondary | ICD-10-CM | POA: Diagnosis not present

## 2020-03-29 ENCOUNTER — Other Ambulatory Visit: Payer: Self-pay | Admitting: Family Medicine

## 2020-04-04 ENCOUNTER — Other Ambulatory Visit: Payer: Self-pay | Admitting: Family Medicine

## 2020-05-12 ENCOUNTER — Other Ambulatory Visit: Payer: Self-pay | Admitting: Family Medicine

## 2020-05-12 NOTE — Telephone Encounter (Signed)
Plz schedule cpe and lab visits.  Then return encounter to me for refill.

## 2020-06-05 ENCOUNTER — Telehealth: Payer: Self-pay

## 2020-06-05 NOTE — Telephone Encounter (Signed)
Error

## 2020-06-25 ENCOUNTER — Ambulatory Visit (INDEPENDENT_AMBULATORY_CARE_PROVIDER_SITE_OTHER): Payer: BC Managed Care – PPO | Admitting: Family Medicine

## 2020-06-25 ENCOUNTER — Encounter: Payer: Self-pay | Admitting: Family Medicine

## 2020-06-25 ENCOUNTER — Other Ambulatory Visit: Payer: Self-pay

## 2020-06-25 VITALS — BP 122/74 | HR 71 | Temp 97.9°F | Ht 67.25 in | Wt 213.5 lb

## 2020-06-25 DIAGNOSIS — Z0001 Encounter for general adult medical examination with abnormal findings: Secondary | ICD-10-CM

## 2020-06-25 DIAGNOSIS — E1136 Type 2 diabetes mellitus with diabetic cataract: Secondary | ICD-10-CM | POA: Diagnosis not present

## 2020-06-25 DIAGNOSIS — E785 Hyperlipidemia, unspecified: Secondary | ICD-10-CM

## 2020-06-25 DIAGNOSIS — Z125 Encounter for screening for malignant neoplasm of prostate: Secondary | ICD-10-CM | POA: Diagnosis not present

## 2020-06-25 DIAGNOSIS — Z23 Encounter for immunization: Secondary | ICD-10-CM | POA: Diagnosis not present

## 2020-06-25 DIAGNOSIS — K580 Irritable bowel syndrome with diarrhea: Secondary | ICD-10-CM

## 2020-06-25 DIAGNOSIS — Z0000 Encounter for general adult medical examination without abnormal findings: Secondary | ICD-10-CM

## 2020-06-25 DIAGNOSIS — I10 Essential (primary) hypertension: Secondary | ICD-10-CM | POA: Diagnosis not present

## 2020-06-25 DIAGNOSIS — R200 Anesthesia of skin: Secondary | ICD-10-CM

## 2020-06-25 DIAGNOSIS — F411 Generalized anxiety disorder: Secondary | ICD-10-CM

## 2020-06-25 DIAGNOSIS — E669 Obesity, unspecified: Secondary | ICD-10-CM

## 2020-06-25 DIAGNOSIS — E1169 Type 2 diabetes mellitus with other specified complication: Secondary | ICD-10-CM

## 2020-06-25 LAB — POCT GLYCOSYLATED HEMOGLOBIN (HGB A1C): Hemoglobin A1C: 7.1 % — AB (ref 4.0–5.6)

## 2020-06-25 LAB — COMPREHENSIVE METABOLIC PANEL
ALT: 40 U/L (ref 0–53)
AST: 25 U/L (ref 0–37)
Albumin: 4.3 g/dL (ref 3.5–5.2)
Alkaline Phosphatase: 60 U/L (ref 39–117)
BUN: 25 mg/dL — ABNORMAL HIGH (ref 6–23)
CO2: 29 mEq/L (ref 19–32)
Calcium: 9.2 mg/dL (ref 8.4–10.5)
Chloride: 105 mEq/L (ref 96–112)
Creatinine, Ser: 1.18 mg/dL (ref 0.40–1.50)
GFR: 62.92 mL/min (ref >60.00)
Glucose, Bld: 125 mg/dL — ABNORMAL HIGH (ref 70–99)
Potassium: 4.3 mEq/L (ref 3.5–5.1)
Sodium: 139 mEq/L (ref 135–145)
Total Bilirubin: 0.4 mg/dL (ref 0.2–1.2)
Total Protein: 6.8 g/dL (ref 6.0–8.3)

## 2020-06-25 LAB — LIPID PANEL
Cholesterol: 129 mg/dL (ref 0–200)
HDL: 30.1 mg/dL — ABNORMAL LOW (ref >39.00)
LDL Cholesterol: 61 mg/dL (ref 0–99)
NonHDL: 99.06
Total CHOL/HDL Ratio: 4
Triglycerides: 192 mg/dL — ABNORMAL HIGH (ref 0.0–149.0)
VLDL: 38.4 mg/dL (ref 0.0–40.0)

## 2020-06-25 LAB — PSA: PSA: 0.41 ng/mL (ref 0.10–4.00)

## 2020-06-25 LAB — TSH: TSH: 1.55 u[IU]/mL (ref 0.35–4.50)

## 2020-06-25 MED ORDER — CITALOPRAM HYDROBROMIDE 20 MG PO TABS: 20.0000 mg | ORAL_TABLET | Freq: Every day | ORAL | 3 refills | Status: DC

## 2020-06-25 MED ORDER — METFORMIN HCL 500 MG PO TABS: 500.0000 mg | ORAL_TABLET | Freq: Every day | ORAL | 3 refills | Status: DC

## 2020-06-25 NOTE — Assessment & Plan Note (Signed)
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 Continues gabapentin 300mg  nightly with benefit.

## 2020-06-25 NOTE — Assessment & Plan Note (Signed)
Chronic, deteriorated. Reviewed pathophysiology of type 2 diabetes as well as management recommendations. Will start metformin 500mg  daily, reviewed side effects to watch for. RTC 3 mo DM f/u visit.

## 2020-06-25 NOTE — Assessment & Plan Note (Signed)
Chronic, stable. Continue current regimen. 

## 2020-06-25 NOTE — Patient Instructions (Addendum)
Labs today along with POC A1c today.  First shingrix today - return in 2-6 months for nurse visit to complete the series.  A1c was 7.1% - consistent with diabetes. Start metformin 500mg  daily with breakfast.  Return in 3 months for diabetes follow up visit. Work on low sugar low carb diabetic diet. Look at diabetes.org for ADA website.  Return as needed or in 1 year for next physical.   Health Maintenance, Male Adopting a healthy lifestyle and getting preventive care are important in promoting health and wellness. Ask your health care provider about:  The right schedule for you to have regular tests and exams.  Things you can do on your own to prevent diseases and keep yourself healthy. What should I know about diet, weight, and exercise? Eat a healthy diet   Eat a diet that includes plenty of vegetables, fruits, low-fat dairy products, and lean protein.  Do not eat a lot of foods that are high in solid fats, added sugars, or sodium. Maintain a healthy weight Body mass index (BMI) is a measurement that can be used to identify possible weight problems. It estimates body fat based on height and weight. Your health care provider can help determine your BMI and help you achieve or maintain a healthy weight. Get regular exercise Get regular exercise. This is one of the most important things you can do for your health. Most adults should:  Exercise for at least 150 minutes each week. The exercise should increase your heart rate and make you sweat (moderate-intensity exercise).  Do strengthening exercises at least twice a week. This is in addition to the moderate-intensity exercise.  Spend less time sitting. Even light physical activity can be beneficial. Watch cholesterol and blood lipids Have your blood tested for lipids and cholesterol at 60 years of age, then have this test every 5 years. You may need to have your cholesterol levels checked more often if:  Your lipid or cholesterol  levels are high.  You are older than 60 years of age.  You are at high risk for heart disease. What should I know about cancer screening? Many types of cancers can be detected early and may often be prevented. Depending on your health history and family history, you may need to have cancer screening at various ages. This may include screening for:  Colorectal cancer.  Prostate cancer.  Skin cancer.  Lung cancer. What should I know about heart disease, diabetes, and high blood pressure? Blood pressure and heart disease  High blood pressure causes heart disease and increases the risk of stroke. This is more likely to develop in people who have high blood pressure readings, are of African descent, or are overweight.  Talk with your health care provider about your target blood pressure readings.  Have your blood pressure checked: ? Every 3-5 years if you are 51-73 years of age. ? Every year if you are 52 years old or older.  If you are between the ages of 12 and 75 and are a current or former smoker, ask your health care provider if you should have a one-time screening for abdominal aortic aneurysm (AAA). Diabetes Have regular diabetes screenings. This checks your fasting blood sugar level. Have the screening done:  Once every three years after age 61 if you are at a normal weight and have a low risk for diabetes.  More often and at a younger age if you are overweight or have a high risk for diabetes. What should I  know about preventing infection? Hepatitis B If you have a higher risk for hepatitis B, you should be screened for this virus. Talk with your health care provider to find out if you are at risk for hepatitis B infection. Hepatitis C Blood testing is recommended for:  Everyone born from [birth year] through 1966.  Anyone with known risk factors for hepatitis C. Sexually transmitted infections (STIs)  You should be screened each year for STIs, including gonorrhea and  chlamydia, if: ? You are sexually active and are younger than 60 years of age. ? You are older than 61 years of age and your health care provider tells you that you are at risk for this type of infection. ? Your sexual activity has changed since you were last screened, and you are at increased risk for chlamydia or gonorrhea. Ask your health care provider if you are at risk.  Ask your health care provider about whether you are at high risk for HIV. Your health care provider may recommend a prescription medicine to help prevent HIV infection. If you choose to take medicine to prevent HIV, you should first get tested for HIV. You should then be tested every 3 months for as long as you are taking the medicine. Follow these instructions at home: Lifestyle  Do not use any products that contain nicotine or tobacco, such as cigarettes, e-cigarettes, and chewing tobacco. If you need help quitting, ask your health care provider.  Do not use street drugs.  Do not share needles.  Ask your health care provider for help if you need support or information about quitting drugs. Alcohol use  Do not drink alcohol if your health care provider tells you not to drink.  If you drink alcohol: ? Limit how much you have to 0-2 drinks a day. ? Be aware of how much alcohol is in your drink. In the [place] one drink equals one 12 oz bottle of beer (355 mL), one 5 oz glass of wine (148 mL), or one 1 oz glass of hard liquor (44 mL). General instructions  Schedule regular health, dental, and eye exams.  Stay current with your vaccines.  Tell your health care provider if: ? You often feel depressed. ? You have ever been abused or do not feel safe at home. Summary  Adopting a healthy lifestyle and getting preventive care are important in promoting health and wellness.  Follow your health care provider's instructions about healthy diet, exercising, and getting tested or screened for diseases.  Follow your health  care provider's instructions on monitoring your cholesterol and blood pressure. This information is not intended to replace advice given to you by your health care provider. Make sure you discuss any questions you have with your health care provider. Document Revised: 11/14/2018 Document Reviewed: 11/14/2018 Elsevier Patient Education  2020 [employer].

## 2020-06-25 NOTE — Assessment & Plan Note (Signed)
Chronic, stable period on celexa 20mg  - continue.

## 2020-06-25 NOTE — Assessment & Plan Note (Signed)
Discussed recent weight gain noted.  Encouraged healthy diet and lifestyle choices to affect sustainable weight loss.

## 2020-06-25 NOTE — Assessment & Plan Note (Signed)
Previously managed with effexor but this caused weight gain and raised blood pressures.  Reviewed likely dx IBS-D - as well as management measures including probiotic, increased fiber, avoiding gas producing foods, trial of lactose free diet, and managing stress/anxiety.

## 2020-06-25 NOTE — Progress Notes (Addendum)
This visit was conducted in person.  BP 122/74 (BP Location: Left Arm, Patient Position: Sitting, Cuff Size: Large)   Pulse 71   Temp 97.9 F (36.6 C) (Temporal)   Ht 5' 7.25" (1.708 m)   Wt (!) 213 lb 8 oz (96.8 kg)   SpO2 95%   BMI 33.19 kg/m    CC: CPE Subjective:    Patient ID: Jermaine Harris, male    DOB: [birth year], 60 y.o.   MRN: [id number]  HPI: Jermaine Harris is a 59 y.o. male presenting on 06/25/2020 for Annual Exam   Gabapentin effective for neuropathic pain. Persistent L leg numbness after herniated disc s/p microdiscectomy L4/5/S1 (2016).   Claustrophobia has improved with celexa. effexor previously raised blood pressure and noted weight gain.   Preventative: COLONOSCOPY Date: 2012 mild diverticulosis, rec rpt 5 yrs 2/2 fmhx polyps ([place]). Due. Requests [place].  COLONOSCOPY WITH PROPOFOL 05/29/2019 - TA, diverticulosis, rpt 5 yrs (Tahiliani, Lennette Bihari, MD)  Prostate cancer screening - yearly screening. Nocturia x1, some urinary urgency.  Flushot yearly Pneumovax 2019 COVID vaccine - completed [place] series 02/2020 Td 2010, Tdap 12/2018 Shingrix - first shot today  Got MMR at school this fall.  Seat belt use discussed Sunscreen use discussed, no changing moles on skin Non smoker  Alcohol - 1 glass wine a few times a week  Dentist q6 mo Eye exam yearly   Divorced (2015) Remarried (2017) Occupation: Paramedic Leisure centre manager) Edu: PhD Activity: golf, walking, yardwork Diet: good water, vegetables daily (salads)     Relevant past medical, surgical, family and social history reviewed and updated as indicated. Interim medical history since our last visit reviewed. Allergies and medications reviewed and updated. Outpatient Medications Prior to Visit  Medication Sig Dispense Refill  . ALPRAZolam (NIRAVAM) 0.5 MG dissolvable tablet Take 1 tablet (0.5 mg total) by mouth daily as needed for anxiety (plane rides). 20 tablet 0  .  gabapentin (NEURONTIN) 300 MG capsule TAKE 1 CAPSULE BY MOUTH EVERYDAY AT BEDTIME 90 capsule 3  . ibuprofen (ADVIL,MOTRIN) 200 MG tablet Take 200 mg by mouth as needed.    . valsartan-hydrochlorothiazide (DIOVAN-HCT) 160-12.5 MG tablet TAKE 1 TABLET BY MOUTH EVERY DAY 90 tablet 2  . citalopram (CELEXA) 20 MG tablet Take 1 tablet (20 mg total) by mouth daily. NEEDS OFFICE VISIT 30 tablet 0  . valsartan-hydrochlorothiazide (DIOVAN-HCT) 320-25 MG tablet TAKE 1 TABLET BY MOUTH EVERY DAY 90 tablet 0   No facility-administered medications prior to visit.     Per HPI unless specifically indicated in ROS section below Review of Systems  Constitutional: Negative for activity change, appetite change, chills, fatigue, fever and unexpected weight change.  HENT: Negative for hearing loss.   Eyes: Negative for visual disturbance.  Respiratory: Negative for cough, chest tightness, shortness of breath and wheezing.   Cardiovascular: Negative for chest pain, palpitations and leg swelling.  Gastrointestinal: Negative for abdominal distention, abdominal pain, blood in stool, constipation, diarrhea (loose stools), nausea and vomiting.       Cramping with BM, urgency - ?IBS-D  Genitourinary: Negative for difficulty urinating and hematuria.  Musculoskeletal: Negative for arthralgias, myalgias and neck pain.  Skin: Negative for rash.  Neurological: Negative for dizziness, seizures, syncope and headaches.  Hematological: Negative for adenopathy. Does not bruise/bleed easily.  Psychiatric/Behavioral: Negative for dysphoric mood. The patient is not nervous/anxious.    Objective:  BP 122/74 (BP Location: Left Arm, Patient Position: Sitting, Cuff Size: Large)   Pulse 71  Temp 97.9 F (36.6 C) (Temporal)   Ht 5' 7.25" (1.708 m)   Wt (!) 213 lb 8 oz (96.8 kg)   SpO2 95%   BMI 33.19 kg/m   Wt Readings from Last 3 Encounters:  06/25/20 (!) 213 lb 8 oz (96.8 kg)  05/29/19 205 lb (93 kg)  01/02/19 208 lb 8 oz  (94.6 kg)      Physical Exam Vitals and nursing note reviewed.  Constitutional:      General: He is not in acute distress.    Appearance: Normal appearance. He is well-developed. He is not ill-appearing.  HENT:     Head: Normocephalic and atraumatic.     Right Ear: Hearing, tympanic membrane, ear canal and external ear normal.     Left Ear: Hearing, tympanic membrane, ear canal and external ear normal.  Eyes:     General: No scleral icterus.    Extraocular Movements: Extraocular movements intact.     Conjunctiva/sclera: Conjunctivae normal.     Pupils: Pupils are equal, round, and reactive to light.  Neck:     Thyroid: No thyroid mass, thyromegaly or thyroid tenderness.  Cardiovascular:     Rate and Rhythm: Normal rate and regular rhythm.     Pulses: Normal pulses.          Radial pulses are 2+ on the right side and 2+ on the left side.     Heart sounds: Normal heart sounds. No murmur heard.   Pulmonary:     Effort: Pulmonary effort is normal. No respiratory distress.     Breath sounds: Normal breath sounds. No wheezing, rhonchi or rales.  Abdominal:     General: Abdomen is flat. Bowel sounds are normal. There is no distension.     Palpations: Abdomen is soft. There is no mass.     Tenderness: There is no abdominal tenderness. There is no guarding or rebound.     Hernia: No hernia is present.  Musculoskeletal:        General: Normal range of motion.     Cervical back: Normal range of motion and neck supple.     Right lower leg: No edema.     Left lower leg: No edema.  Lymphadenopathy:     Cervical: No cervical adenopathy.  Skin:    General: Skin is warm and dry.     Findings: No rash.  Neurological:     General: No focal deficit present.     Mental Status: He is alert and oriented to person, place, and time.     Comments: CN grossly intact, station and gait intact  Psychiatric:        Mood and Affect: Mood normal.        Behavior: Behavior normal.        Thought  Content: Thought content normal.        Judgment: Judgment normal.       Depression screen [facility] 2/9 06/25/2020 01/02/2019 11/22/2017  Decreased Interest 0 0 0  Down, Depressed, Hopeless 1 1 0  PHQ - 2 Score 1 1 0  Altered sleeping 1 1 -  Tired, decreased energy 1 1 -  Change in appetite 0 0 -  Feeling bad or failure about yourself  0 1 -  Trouble concentrating 0 0 -  Moving slowly or fidgety/restless 0 0 -  Suicidal thoughts 0 0 -  PHQ-9 Score 3 4 -    GAD 7 : Generalized Anxiety Score 06/25/2020 01/02/2019  Nervous, Anxious, on  Edge 1 1  Control/stop worrying 1 1  Worry too much - different things 1 1  Trouble relaxing 1 0  Restless 0 0  Easily annoyed or irritable 1 1  Afraid - awful might happen 0 0  Total GAD 7 Score 5 4   Lab Results  Component Value Date   HGBA1C 7.1 (A) 06/25/2020    Assessment & Plan:  This visit occurred during the SARS-CoV-2 public health emergency.  Safety protocols were in place, including screening questions prior to the visit, additional usage of staff PPE, and extensive cleaning of exam room while observing appropriate contact time as indicated for disinfecting solutions.   Problem List Items Addressed This Visit    Obesity, Class I, BMI 30-34.9    Discussed recent weight gain noted.  Encouraged healthy diet and lifestyle choices to affect sustainable weight loss.       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 Continues gabapentin 366m nightly with benefit.       Irritable bowel syndrome    Previously managed with effexor but this caused weight gain and raised blood pressures.  Reviewed likely dx IBS-D - as well as management measures including probiotic, increased fiber, avoiding gas producing foods, trial of lactose free diet, and managing stress/anxiety.       GAD (generalized anxiety disorder)    Chronic, stable period on celexa 231m- continue.       Relevant Medications   citalopram (CELEXA) 20  MG tablet   Essential hypertension    Chronic, stable. Continue current regimen.       Relevant Orders   TSH   Encounter for general adult medical examination with abnormal findings - Primary    Preventative protocols reviewed and updated unless pt declined. Discussed healthy diet and lifestyle.       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 Reviewed pathophysiology of type 2 diabetes as well as management recommendations. Will start metformin 50074maily, reviewed side effects to watch for. RTC 3 mo DM f/u visit.       Relevant Medications   metFORMIN (GLUCOPHAGE) 500 MG tablet   Other Relevant Orders   Lipid panel   Comprehensive metabolic panel   POCT glycosylated hemoglobin (Hb A1C) (Completed)    Other Visit Diagnoses    Special screening for malignant neoplasm of prostate       Relevant Orders   PSA   Need for shingles vaccine       Relevant Orders   Varicella-zoster vaccine IM (Completed)       Meds ordered this encounter  Medications  . citalopram (CELEXA) 20 MG tablet    Sig: Take 1 tablet (20 mg total) by mouth daily.    Dispense:  90 tablet    Refill:  3  . metFORMIN (GLUCOPHAGE) 500 MG tablet    Sig: Take 1 tablet (500 mg total) by mouth daily with breakfast.    Dispense:  90 tablet    Refill:  3   Orders Placed This Encounter  Procedures  . Varicella-zoster vaccine IM  . Lipid panel  . Comprehensive metabolic panel  . TSH  . PSA  . POCT glycosylated hemoglobin (Hb A1C)    Patient instructions: Labs today along with POC A1c today.  First shingrix today - return in 2-6 months for nurse visit to complete the series.  A1c was 7.1% -  consistent with diabetes. Start metformin 526m daily with breakfast.  Return in 3 months for diabetes follow up visit. Work on low sugar low carb diabetic diet. Look at diabetes.org for ADA website.  Return as needed or in  1 year for next physical.   Follow up plan: Return in about 1 year (around 06/25/2021) for annual exam, prior fasting for blood work.  JRia Bush MD

## 2020-06-25 NOTE — Assessment & Plan Note (Signed)
Preventative protocols reviewed and updated unless pt declined. Discussed healthy diet and lifestyle.  

## 2020-06-27 ENCOUNTER — Encounter: Payer: Self-pay | Admitting: Family Medicine

## 2020-06-27 DIAGNOSIS — E1169 Type 2 diabetes mellitus with other specified complication: Secondary | ICD-10-CM | POA: Insufficient documentation

## 2020-08-06 NOTE — Telephone Encounter (Signed)
Patient had physical in July. Routing to close.

## 2020-08-07 NOTE — Telephone Encounter (Signed)
Please phone in due to E prescribing error.  

## 2020-08-07 NOTE — Telephone Encounter (Signed)
Refill left on vm at pharmacy.  

## 2020-09-29 ENCOUNTER — Ambulatory Visit: Payer: BC Managed Care – PPO | Admitting: Family Medicine

## 2020-10-13 ENCOUNTER — Other Ambulatory Visit: Payer: Self-pay

## 2020-10-13 ENCOUNTER — Ambulatory Visit: Payer: BC Managed Care – PPO | Admitting: Family Medicine

## 2020-10-13 ENCOUNTER — Encounter: Payer: Self-pay | Admitting: Family Medicine

## 2020-10-13 VITALS — BP 134/86 | HR 73 | Temp 97.9°F | Ht 67.25 in | Wt 215.2 lb

## 2020-10-13 DIAGNOSIS — E1136 Type 2 diabetes mellitus with diabetic cataract: Secondary | ICD-10-CM

## 2020-10-13 DIAGNOSIS — F411 Generalized anxiety disorder: Secondary | ICD-10-CM | POA: Diagnosis not present

## 2020-10-13 LAB — POCT GLYCOSYLATED HEMOGLOBIN (HGB A1C): Hemoglobin A1C: 7.2 % — AB (ref 4.0–5.6)

## 2020-10-13 MED ORDER — METFORMIN HCL ER 500 MG PO TB24: 500.0000 mg | ORAL_TABLET | Freq: Every day | ORAL | 3 refills | Status: DC

## 2020-10-13 NOTE — Progress Notes (Signed)
This visit was conducted in person.  BP 134/86 (BP Location: Right Arm, Patient Position: Sitting, Cuff Size: Large)   Pulse 73   Temp 97.9 F (36.6 C) (Temporal)   Ht 5' 7.25" (1.708 m)   Wt 215 lb 3 oz (97.6 kg)   SpO2 97%   BMI 33.45 kg/m    CC: DM Subjective:    Patient ID: Jermaine Harris, male    DOB: Feb 12, 1961, 59 y.o.   MRN: [id number]  HPI: Jermaine Harris is a 59 y.o. male presenting on 10/13/2020 for Diabetes (Here for 3 mo f/u.)   Stressful fall semester at job.   DM - does not regularly check sugars. Did not start metformin 500mg  daily - unable to tolerate due to diarrhea. Denies low sugars or hypoglycemic symptoms. Denies paresthesias. Last diabetic eye exam - upcoming appt. Pneumovax: 02/2018. Prevnar: not due. Glucometer brand: doesn't have. DSME: declines for now.  Lab Results  Component Value Date   HGBA1C 7.2 (A) 10/13/2020   Diabetic Foot Exam - Simple   Simple Foot Form Diabetic Foot exam was performed with the following findings: Yes 10/13/2020 10:16 AM  Visual Inspection No deformities, no ulcerations, no other skin breakdown bilaterally: Yes Sensation Testing Intact to touch and monofilament testing bilaterally: Yes Pulse Check Posterior Tibialis and Dorsalis pulse intact bilaterally: Yes Comments Diminished to monofilament at left foot at known residual deficit after lumbar HNP s/p surgery    Lab Results  Component Value Date   MICROALBUR 1.1 03/31/2015        Relevant past medical, surgical, family and social history reviewed and updated as indicated. Interim medical history since our last visit reviewed. Allergies and medications reviewed and updated. Outpatient Medications Prior to Visit  Medication Sig Dispense Refill  . ALPRAZolam (NIRAVAM) 0.5 MG dissolvable tablet Take 1 tablet (0.5 mg total) by mouth daily as needed for anxiety (plane rides). 20 tablet 0  . citalopram (CELEXA) 20 MG tablet Take 1 tablet (20 mg total) by mouth daily.  90 tablet 3  . gabapentin (NEURONTIN) 300 MG capsule TAKE 1 CAPSULE BY MOUTH EVERYDAY AT BEDTIME 90 capsule 3  . ibuprofen (ADVIL,MOTRIN) 200 MG tablet Take 200 mg by mouth as needed.    . valsartan-hydrochlorothiazide (DIOVAN-HCT) 160-12.5 MG tablet TAKE 1 TABLET BY MOUTH EVERY DAY 90 tablet 2  . metFORMIN (GLUCOPHAGE) 500 MG tablet Take 1 tablet (500 mg total) by mouth daily with breakfast. (Patient not taking: Reported on 10/13/2020) 90 tablet 3   No facility-administered medications prior to visit.     Per HPI unless specifically indicated in ROS section below Review of Systems Objective:  BP 134/86 (BP Location: Right Arm, Patient Position: Sitting, Cuff Size: Large)   Pulse 73   Temp 97.9 F (36.6 C) (Temporal)   Ht 5' 7.25" (1.708 m)   Wt 215 lb 3 oz (97.6 kg)   SpO2 97%   BMI 33.45 kg/m   Wt Readings from Last 3 Encounters:  10/13/20 215 lb 3 oz (97.6 kg)  06/25/20 (!) 213 lb 8 oz (96.8 kg)  05/29/19 205 lb (93 kg)      Physical Exam Vitals and nursing note reviewed.  Constitutional:      General: He is not in acute distress.    Appearance: Normal appearance. He is well-developed. He is not ill-appearing.  HENT:     Head: Normocephalic and atraumatic.     Right Ear: External ear normal.     Left Ear: External ear normal.  Eyes:     General: No scleral icterus.    Extraocular Movements: Extraocular movements intact.     Conjunctiva/sclera: Conjunctivae normal.     Pupils: Pupils are equal, round, and reactive to light.  Cardiovascular:     Rate and Rhythm: Normal rate and regular rhythm.     Pulses: Normal pulses.     Heart sounds: Normal heart sounds. No murmur heard.  No gallop.   Pulmonary:     Effort: Pulmonary effort is normal. No respiratory distress.     Breath sounds: Normal breath sounds. No wheezing, rhonchi or rales.  Musculoskeletal:     Cervical back: Normal range of motion and neck supple.     Right lower leg: No edema.     Left lower leg: No  edema.     Comments: See HPI for foot exam if done  Lymphadenopathy:     Cervical: No cervical adenopathy.  Skin:    General: Skin is warm and dry.     Findings: No rash.  Neurological:     Mental Status: He is alert.  Psychiatric:        Mood and Affect: Mood normal.        Behavior: Behavior normal.       Results for orders placed or performed in visit on 10/13/20  POCT glycosylated hemoglobin (Hb A1C)  Result Value Ref Range   Hemoglobin A1C 7.2 (A) 4.0 - 5.6 %   HbA1c POC (<> result, manual entry)     HbA1c, POC (prediabetic range)     HbA1c, POC (controlled diabetic range)     Assessment & Plan:  This visit occurred during the SARS-CoV-2 public health emergency.  Safety protocols were in place, including screening questions prior to the visit, additional usage of staff PPE, and extensive cleaning of exam room while observing appropriate contact time as indicated for disinfecting solutions.   Problem List Items Addressed This Visit    GAD (generalized anxiety disorder)    Deterioration noted manifesting as generalized malaise in setting of worsening work stressors. Support provided. Discussed option of celexa 40mg  - declines due to increased chance of altered cardiac conduction abnormality.      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 Discussed how this can be expected side effect to metformin - but will trial XR metformin 500mg  nightly. Update if trouble tolerating to change regimen. RTC 3-4 mo DM f/u visit. Encouraged efforts to follow diabetic diet.       Relevant Medications   metFORMIN (GLUCOPHAGE XR) 500 MG 24 hr tablet   Other Relevant Orders   POCT glycosylated hemoglobin (Hb A1C) (Completed)       Meds ordered this encounter  Medications  . metFORMIN (GLUCOPHAGE XR) 500 MG 24 hr tablet    Sig: Take 1 tablet (500 mg  total) by mouth daily.    Dispense:  30 tablet    Refill:  3   Orders Placed This Encounter  Procedures  . POCT glycosylated hemoglobin (Hb A1C)    Patient Instructions  Start metformin XR extended release version which is usually better tolerated, take at night time to start.  Check with pharmacy or insurance on preferred glucometer brand.  Have eye doctor get a good look at back of eye with new diabetes diagnosis - and have them fax me copy of their report.  Return  as needed or in 3-4 months for diabetes follow up visit.   Follow up plan: Return in about 3 months (around 01/13/2021) for follow up visit.  Ria Bush, MD

## 2020-10-13 NOTE — Assessment & Plan Note (Signed)
Did not continue metformin past 2 days due to diarrhea. Discussed how this can be expected side effect to metformin - but will trial XR metformin 500mg  nightly. Update if trouble tolerating to change regimen. RTC 3-4 mo DM f/u visit. Encouraged efforts to follow diabetic diet.

## 2020-10-13 NOTE — Assessment & Plan Note (Signed)
Deterioration noted manifesting as generalized malaise in setting of worsening work stressors. Support provided. Discussed option of celexa 40mg  - declines due to increased chance of altered cardiac conduction abnormality.

## 2020-10-13 NOTE — Patient Instructions (Addendum)
Start metformin XR extended release version which is usually better tolerated, take at night time to start.  Check with pharmacy or insurance on preferred glucometer brand.  Have eye doctor get a good look at back of eye with new diabetes diagnosis - and have them fax me copy of their report.  Return as needed or in 3-4 months for diabetes follow up visit.

## 2020-12-28 ENCOUNTER — Other Ambulatory Visit: Payer: Self-pay | Admitting: Family Medicine

## 2020-12-31 ENCOUNTER — Ambulatory Visit: Payer: Self-pay

## 2020-12-31 ENCOUNTER — Telehealth: Payer: Self-pay | Admitting: Nurse Practitioner

## 2020-12-31 ENCOUNTER — Encounter: Payer: Self-pay | Admitting: Family Medicine

## 2020-12-31 ENCOUNTER — Other Ambulatory Visit: Payer: Self-pay

## 2020-12-31 DIAGNOSIS — Z20822 Contact with and (suspected) exposure to covid-19: Secondary | ICD-10-CM

## 2020-12-31 DIAGNOSIS — R0981 Nasal congestion: Secondary | ICD-10-CM

## 2020-12-31 DIAGNOSIS — R519 Headache, unspecified: Secondary | ICD-10-CM

## 2020-12-31 LAB — POC INFLUENZA A&B (BINAX/QUICKVUE)
Influenza A, POC: NEGATIVE
Influenza B, POC: NEGATIVE

## 2020-12-31 LAB — POC COVID19 BINAXNOW: SARS Coronavirus 2 Ag: NEGATIVE

## 2020-12-31 NOTE — Progress Notes (Signed)
Subjective:    Patient ID: Jermaine Harris, male    DOB: 10-31-1961, 60 y.o.   MRN: [id number]  HPI  60 year old male presenting to clinic today with concerns regarding current symptoms of headache, 101 fever, symptoms seem to worse in the afternoon. Has been taking Advil.   Traveled to [place] 12/17/20- 12/26/20.   vaccinated and boosted for COVID-19  Patient was seen in person by nurse for testing, and had follow up visit with provider in telehealth format. Patient consents to telehealth appointment.   Denies recent vaccination or known exposure to sick contact.   Past Medical History:  Diagnosis Date  . Actinic keratosis   . Claustrophobia   . GAD (generalized anxiety disorder)    claustrophobia, some panic attacks  . HTN (hypertension)   . Irritable bowel syndrome   . Lumbar herniated disc 06/2015  . Seasonal allergies     Current Outpatient Medications  Medication Instructions  . ALPRAZolam (NIRAVAM) 0.5 mg, Oral, Daily PRN  . citalopram (CELEXA) 20 mg, Oral, Daily  . gabapentin (NEURONTIN) 300 MG capsule TAKE 1 CAPSULE BY MOUTH EVERYDAY AT BEDTIME  . ibuprofen (ADVIL) 200 mg, As needed  . metFORMIN (GLUCOPHAGE XR) 500 mg, Oral, Daily  . valsartan-hydrochlorothiazide (DIOVAN-HCT) 160-12.5 MG tablet TAKE 1 TABLET BY MOUTH EVERY DAY    Review of Systems  Constitutional: Positive for fever.  HENT: Negative.   Respiratory: Negative.   Cardiovascular: Negative.   Neurological: Positive for headaches. Negative for dizziness.       Objective:   Physical Exam  This was a telehealth appointment with patient who was found to be in no acute distress during phone conversation with provider   Recent Results (from the past 2160 hour(s))  POCT glycosylated hemoglobin (Hb A1C)     Status: Abnormal   Collection Time: 10/13/20  9:59 AM  Result Value Ref Range   Hemoglobin A1C 7.2 (A) 4.0 - 5.6 %   HbA1c POC (<> result, manual entry)     HbA1c, POC (prediabetic range)      HbA1c, POC (controlled diabetic range)    POC COVID-19     Status: Normal   Collection Time: 12/31/20  2:16 PM  Result Value Ref Range   SARS Coronavirus 2 Ag Negative Negative    Comment: Vaccinated and boosted. Patient aware of neg POC results. Has phone appt with S.Smith Potenza FNP for further eval.  POC Influenza A&B(BINAX/QUICKVUE)     Status: Normal   Collection Time: 12/31/20  2:27 PM  Result Value Ref Range   Influenza A, POC Negative Negative    Comment: vaccinated patient, mildly symptomatic, aware of neg POC result. Has phone appt with S. Paker FNP   Influenza B, POC Negative Negative    Comment: vaccinated patien, mildly symptomatic, aware of neg POC result. Has phone appt with Marigene Ehlers for further eval.      Assessment & Plan:   Patient was started on Metformin in the past 60 days no other changes to medical history aside from recent travel to [place]  Presented to clinic today for flu and covid testing that were both negative.   Will send out PCR for confirmation-   Patient will remain out of work until PCR returns. Patient will schedule follow up with PCP for next week regardign f/u after initiation of metformin and to investigate further symptoms more if PCR is negative- May warrant CBC/ viral workup due to overseas travel.   He will RTC or seek  medical attention for any acutely worsening symptoms as discussed.   RT work is 01/04/21 unless symptoms worsen or have not improve/patient is not feeling ready to return he will have a follow in office.   He may continue to use advil for pain relief as needed.

## 2020-12-31 NOTE — Progress Notes (Signed)
poc

## 2021-01-01 ENCOUNTER — Ambulatory Visit: Payer: Self-pay

## 2021-01-01 ENCOUNTER — Telehealth: Payer: Self-pay | Admitting: Nurse Practitioner

## 2021-01-02 LAB — NOVEL CORONAVIRUS, NAA: SARS-CoV-2, NAA: NOT DETECTED

## 2021-01-02 LAB — SARS-COV-2, NAA 2 DAY TAT

## 2021-01-03 ENCOUNTER — Encounter: Payer: Self-pay | Admitting: Nurse Practitioner

## 2021-01-04 ENCOUNTER — Other Ambulatory Visit (INDEPENDENT_AMBULATORY_CARE_PROVIDER_SITE_OTHER): Payer: BC Managed Care – PPO

## 2021-01-04 ENCOUNTER — Telehealth (INDEPENDENT_AMBULATORY_CARE_PROVIDER_SITE_OTHER): Payer: BC Managed Care – PPO | Admitting: Family Medicine

## 2021-01-04 ENCOUNTER — Other Ambulatory Visit: Payer: Self-pay

## 2021-01-04 ENCOUNTER — Encounter: Payer: Self-pay | Admitting: Family Medicine

## 2021-01-04 ENCOUNTER — Ambulatory Visit
Admission: RE | Admit: 2021-01-04 | Discharge: 2021-01-04 | Disposition: A | Discharge status: Home / Self Care | Payer: BC Managed Care – PPO | Source: Ambulatory Visit | Attending: Family Medicine | Admitting: Family Medicine

## 2021-01-04 VITALS — Temp 100.5°F | Ht 67.25 in

## 2021-01-04 DIAGNOSIS — R0603 Acute respiratory distress: Secondary | ICD-10-CM | POA: Diagnosis not present

## 2021-01-04 DIAGNOSIS — I700 Atherosclerosis of aorta: Secondary | ICD-10-CM | POA: Diagnosis not present

## 2021-01-04 DIAGNOSIS — A419 Sepsis, unspecified organism: Secondary | ICD-10-CM | POA: Diagnosis not present

## 2021-01-04 DIAGNOSIS — A481 Legionnaires' disease: Secondary | ICD-10-CM | POA: Diagnosis not present

## 2021-01-04 DIAGNOSIS — I10 Essential (primary) hypertension: Secondary | ICD-10-CM | POA: Diagnosis not present

## 2021-01-04 DIAGNOSIS — E669 Obesity, unspecified: Secondary | ICD-10-CM | POA: Diagnosis not present

## 2021-01-04 DIAGNOSIS — Z6833 Body mass index (BMI) 33.0-33.9, adult: Secondary | ICD-10-CM | POA: Diagnosis not present

## 2021-01-04 DIAGNOSIS — N179 Acute kidney failure, unspecified: Secondary | ICD-10-CM | POA: Diagnosis not present

## 2021-01-04 DIAGNOSIS — Z20822 Contact with and (suspected) exposure to covid-19: Secondary | ICD-10-CM | POA: Diagnosis not present

## 2021-01-04 DIAGNOSIS — J156 Pneumonia due to other aerobic Gram-negative bacteria: Secondary | ICD-10-CM | POA: Diagnosis not present

## 2021-01-04 DIAGNOSIS — R058 Other specified cough: Secondary | ICD-10-CM

## 2021-01-04 DIAGNOSIS — N189 Chronic kidney disease, unspecified: Secondary | ICD-10-CM | POA: Diagnosis not present

## 2021-01-04 DIAGNOSIS — R509 Fever, unspecified: Secondary | ICD-10-CM

## 2021-01-04 DIAGNOSIS — E1122 Type 2 diabetes mellitus with diabetic chronic kidney disease: Secondary | ICD-10-CM | POA: Diagnosis not present

## 2021-01-04 DIAGNOSIS — Z9981 Dependence on supplemental oxygen: Secondary | ICD-10-CM | POA: Diagnosis not present

## 2021-01-04 DIAGNOSIS — E119 Type 2 diabetes mellitus without complications: Secondary | ICD-10-CM | POA: Diagnosis not present

## 2021-01-04 DIAGNOSIS — Z8371 Family history of colonic polyps: Secondary | ICD-10-CM | POA: Diagnosis not present

## 2021-01-04 DIAGNOSIS — F419 Anxiety disorder, unspecified: Secondary | ICD-10-CM | POA: Diagnosis not present

## 2021-01-04 DIAGNOSIS — J9690 Respiratory failure, unspecified, unspecified whether with hypoxia or hypercapnia: Secondary | ICD-10-CM | POA: Diagnosis not present

## 2021-01-04 DIAGNOSIS — R519 Headache, unspecified: Secondary | ICD-10-CM

## 2021-01-04 DIAGNOSIS — J9600 Acute respiratory failure, unspecified whether with hypoxia or hypercapnia: Secondary | ICD-10-CM | POA: Diagnosis not present

## 2021-01-04 DIAGNOSIS — R0602 Shortness of breath: Secondary | ICD-10-CM | POA: Diagnosis not present

## 2021-01-04 DIAGNOSIS — J189 Pneumonia, unspecified organism: Secondary | ICD-10-CM | POA: Diagnosis not present

## 2021-01-04 DIAGNOSIS — I5033 Acute on chronic diastolic (congestive) heart failure: Secondary | ICD-10-CM | POA: Diagnosis not present

## 2021-01-04 DIAGNOSIS — J9601 Acute respiratory failure with hypoxia: Secondary | ICD-10-CM | POA: Diagnosis not present

## 2021-01-04 DIAGNOSIS — Z79899 Other long term (current) drug therapy: Secondary | ICD-10-CM | POA: Diagnosis not present

## 2021-01-04 DIAGNOSIS — I129 Hypertensive chronic kidney disease with stage 1 through stage 4 chronic kidney disease, or unspecified chronic kidney disease: Secondary | ICD-10-CM | POA: Diagnosis not present

## 2021-01-04 DIAGNOSIS — Z7984 Long term (current) use of oral hypoglycemic drugs: Secondary | ICD-10-CM | POA: Diagnosis not present

## 2021-01-04 DIAGNOSIS — J984 Other disorders of lung: Secondary | ICD-10-CM | POA: Diagnosis not present

## 2021-01-04 DIAGNOSIS — N170 Acute kidney failure with tubular necrosis: Secondary | ICD-10-CM | POA: Diagnosis not present

## 2021-01-04 DIAGNOSIS — R52 Pain, unspecified: Secondary | ICD-10-CM | POA: Diagnosis not present

## 2021-01-04 DIAGNOSIS — J8410 Pulmonary fibrosis, unspecified: Secondary | ICD-10-CM | POA: Diagnosis not present

## 2021-01-04 DIAGNOSIS — I2721 Secondary pulmonary arterial hypertension: Secondary | ICD-10-CM | POA: Diagnosis not present

## 2021-01-04 DIAGNOSIS — F411 Generalized anxiety disorder: Secondary | ICD-10-CM | POA: Diagnosis not present

## 2021-01-04 DIAGNOSIS — R7401 Elevation of levels of liver transaminase levels: Secondary | ICD-10-CM | POA: Diagnosis not present

## 2021-01-04 DIAGNOSIS — D649 Anemia, unspecified: Secondary | ICD-10-CM | POA: Diagnosis not present

## 2021-01-04 DIAGNOSIS — Z8249 Family history of ischemic heart disease and other diseases of the circulatory system: Secondary | ICD-10-CM | POA: Diagnosis not present

## 2021-01-04 DIAGNOSIS — J069 Acute upper respiratory infection, unspecified: Secondary | ICD-10-CM | POA: Diagnosis not present

## 2021-01-04 DIAGNOSIS — F40240 Claustrophobia: Secondary | ICD-10-CM | POA: Diagnosis not present

## 2021-01-04 DIAGNOSIS — E8809 Other disorders of plasma-protein metabolism, not elsewhere classified: Secondary | ICD-10-CM | POA: Diagnosis not present

## 2021-01-04 DIAGNOSIS — I110 Hypertensive heart disease with heart failure: Secondary | ICD-10-CM | POA: Diagnosis not present

## 2021-01-04 DIAGNOSIS — F32A Depression, unspecified: Secondary | ICD-10-CM | POA: Diagnosis not present

## 2021-01-04 DIAGNOSIS — R059 Cough, unspecified: Secondary | ICD-10-CM | POA: Diagnosis not present

## 2021-01-04 DIAGNOSIS — Z794 Long term (current) use of insulin: Secondary | ICD-10-CM | POA: Diagnosis not present

## 2021-01-04 DIAGNOSIS — E1165 Type 2 diabetes mellitus with hyperglycemia: Secondary | ICD-10-CM | POA: Diagnosis not present

## 2021-01-04 HISTORY — DX: Legionnaires' disease: A48.1

## 2021-01-04 LAB — COMPREHENSIVE METABOLIC PANEL
ALT: 38 U/L (ref 0–53)
AST: 26 U/L (ref 0–37)
Albumin: 3.4 g/dL — ABNORMAL LOW (ref 3.5–5.2)
Alkaline Phosphatase: 64 U/L (ref 39–117)
BUN: 24 mg/dL — ABNORMAL HIGH (ref 6–23)
CO2: 24 mEq/L (ref 19–32)
Calcium: 8.3 mg/dL — ABNORMAL LOW (ref 8.4–10.5)
Chloride: 99 mEq/L (ref 96–112)
Creatinine, Ser: 1.52 mg/dL — ABNORMAL HIGH (ref 0.40–1.50)
GFR: 49.4 mL/min — ABNORMAL LOW (ref >60.00)
Glucose, Bld: 237 mg/dL — ABNORMAL HIGH (ref 70–99)
Potassium: 3.5 mEq/L (ref 3.5–5.1)
Sodium: 135 mEq/L (ref 135–145)
Total Bilirubin: 0.6 mg/dL (ref 0.2–1.2)
Total Protein: 6 g/dL (ref 6.0–8.3)

## 2021-01-04 NOTE — Assessment & Plan Note (Signed)
7 days of fever, body aches, headache and dry cough after recent trip to [place]. Has tested negative for COVID x3 including with PCR as well as negative for influenza.  Concern for travel-borne illness - I have asked him to come in for curbside labs including CBC with periph smear (eval malaria), blood cultures x2 (eval typhoid/paratyphoid), CMP.  Will also check CXR at [facility].  He is currently on augmentin course through a friend - this may obfuscate blood culture results.

## 2021-01-04 NOTE — Addendum Note (Signed)
Addended by: Cloyd Stagers on: 01/04/2021 01:05 PM   Modules accepted: Orders

## 2021-01-04 NOTE — Progress Notes (Signed)
Patient ID: Jermaine Harris, male    DOB: 1961/11/17, 60 y.o.   MRN: [id number]  Virtual visit completed through [facility] [facility], a video enabled telemedicine application. Due to national recommendations of social distancing due to COVID-19, a virtual visit is felt to be most appropriate for this patient at this time. Reviewed limitations, risks, security and privacy concerns of performing a virtual visit and the availability of in person appointments. I also reviewed that there may be a patient responsible charge related to this service. The patient agreed to proceed.   Patient location: home Provider location: [place] at [facility], office Persons participating in this virtual visit: patient, provider   If any vitals were documented, they were collected by patient at home unless specified below.    Temp (!) 100.5 F (38.1 C)   Ht 5' 7.25" (1.708 m)   BMI 33.45 kg/m    CC: fever, headache, body aches and dry cough.  Subjective:   HPI: Jermaine Harris is a 60 y.o. male presenting on 01/04/2021 for Headache (C/o HA, fever- max 103.5, body aches and dry cough.  Sxs started 12/30/20.  Has had several neg COVID.  Has been taking ibuprofen and abx, helping. )   7d h/o fever Tmax 103.5, chills, body aches, dry cough, bad headache, decreased appetite.  Fever to 103 last night. Fever lower initially, now trending up. Overall some improvement noted.   Managing symptoms with ibuprofen 600mg  alternating with tylenol 1000mg .  Has had COVID tests x3, 2 rapid and 1 PCR - all negative - within first 2 days of symptoms.  Seen at [facility] on Thursday - tested negative for flu as well.   No ear or tooth pain, abd pain, nausea, diarrhea, ST, congestion or PNDrainage.  No dyspnea, chest pain or wheezing.  No urine symptoms or rash.  No sick contacts at home.   Called friend who is a physician - started on augmentin course 2 days ago. He feels this has helped some.   COVID vaccine Pfizer 02/2020 x2, booster  09/2020.   Just came back from trip to [place] (1/13-22/22), unaware of any sick contacts while there.      Relevant past medical, surgical, family and social history reviewed and updated as indicated. Interim medical history since our last visit reviewed. Allergies and medications reviewed and updated. Outpatient Medications Prior to Visit  Medication Sig Dispense Refill  . ALPRAZolam (NIRAVAM) 0.5 MG dissolvable tablet Take 1 tablet (0.5 mg total) by mouth daily as needed for anxiety (plane rides). 20 tablet 0  . amoxicillin-clavulanate (AUGMENTIN) 875-125 MG tablet Take 1 tablet by mouth 2 (two) times daily.    . citalopram (CELEXA) 20 MG tablet Take 1 tablet (20 mg total) by mouth daily. 90 tablet 3  . gabapentin (NEURONTIN) 300 MG capsule TAKE 1 CAPSULE BY MOUTH EVERYDAY AT BEDTIME 90 capsule 3  . ibuprofen (ADVIL,MOTRIN) 200 MG tablet Take 200 mg by mouth as needed.    . metFORMIN (GLUCOPHAGE XR) 500 MG 24 hr tablet Take 1 tablet (500 mg total) by mouth daily. 30 tablet 3  . valsartan-hydrochlorothiazide (DIOVAN-HCT) 160-12.5 MG tablet TAKE 1 TABLET BY MOUTH EVERY DAY 90 tablet 1   No facility-administered medications prior to visit.     Per HPI unless specifically indicated in ROS section below Review of Systems Objective:  Temp (!) 100.5 F (38.1 C)   Ht 5' 7.25" (1.708 m)   BMI 33.45 kg/m   Wt Readings from Last 3 Encounters:  10/13/20  215 lb 3 oz (97.6 kg)  06/25/20 (!) 213 lb 8 oz (96.8 kg)  05/29/19 205 lb (93 kg)       Physical exam: Gen: alert, NAD, tired but nontoxic appearing laying in bed Pulm: speaks in complete sentences without increased work of breathing, dry nagging cough present Psych: normal mood, normal thought content      Results for orders placed or performed in visit on 12/31/20  Novel Coronavirus, NAA ([facility])   Specimen: Nasopharyngeal(NP) swabs in vial transport medium   Nasopharynge  Result Value Ref Range   SARS-CoV-2, NAA Not Detected  Not Detected  SARS-COV-2, NAA 2 DAY TAT   Nasopharynge  Result Value Ref Range   SARS-CoV-2, NAA 2 DAY TAT Performed   POC Influenza A&B(BINAX/QUICKVUE)  Result Value Ref Range   Influenza A, POC Negative Negative   Influenza B, POC Negative Negative   Assessment & Plan:   Problem List Items Addressed This Visit    Fever - Primary    7 days of fever, body aches, headache and dry cough after recent trip to [place]. Has tested negative for COVID x3 including with PCR as well as negative for influenza.  Concern for travel-borne illness - I have asked him to come in for curbside labs including CBC with periph smear (eval malaria), blood cultures x2 (eval typhoid/paratyphoid), CMP.  Will also check CXR at [facility].  He is currently on augmentin course through a friend - this may obfuscate blood culture results.      Relevant Orders   CBC with Differential/Platelet   Comprehensive metabolic panel   Pathologist smear review   Culture, blood (routine x 2)   DG Chest 2 View    Other Visit Diagnoses    Nonintractable episodic headache, unspecified headache type       Dry cough       Relevant Orders   DG Chest 2 View   Body aches           No orders of the defined types were placed in this encounter.  Orders Placed This Encounter  Procedures  . Culture, blood (routine x 2)    Standing Status:   Future    Standing Expiration Date:   01/04/2022  . DG Chest 2 View    Standing Status:   Future    Standing Expiration Date:   01/04/2022    Order Specific Question:   Reason for Exam (SYMPTOM  OR DIAGNOSIS REQUIRED)    Answer:   dry cough, fever    Order Specific Question:   Preferred imaging location?    Answer:   [facility]  . CBC with Differential/Platelet    Standing Status:   Future    Standing Expiration Date:   01/04/2022  . Comprehensive metabolic panel    Standing Status:   Future    Standing Expiration Date:   01/04/2022  . Pathologist smear review    Standing Status:    Future    Standing Expiration Date:   01/04/2022    I discussed the assessment and treatment plan with the patient. The patient was provided an opportunity to ask questions and all were answered. The patient agreed with the plan and demonstrated an understanding of the instructions. The patient was advised to call back or seek an in-person evaluation if the symptoms worsen or if the condition fails to improve as anticipated.  Follow up plan: No follow-ups on file.  Ria Bush, MD

## 2021-01-04 NOTE — Progress Notes (Signed)
Yes , I will

## 2021-01-05 ENCOUNTER — Telehealth: Payer: Self-pay

## 2021-01-05 ENCOUNTER — Emergency Department: Payer: BC Managed Care – PPO

## 2021-01-05 ENCOUNTER — Inpatient Hospital Stay
Admission: EM | Admit: 2021-01-05 | Discharge: 2021-01-15 | DRG: 177 | Disposition: A | Discharge status: Home / Self Care | Payer: BC Managed Care – PPO | Attending: Internal Medicine | Admitting: Internal Medicine

## 2021-01-05 ENCOUNTER — Encounter: Payer: Self-pay | Admitting: Emergency Medicine

## 2021-01-05 ENCOUNTER — Other Ambulatory Visit: Payer: Self-pay

## 2021-01-05 DIAGNOSIS — Z79899 Other long term (current) drug therapy: Secondary | ICD-10-CM

## 2021-01-05 DIAGNOSIS — F32A Depression, unspecified: Secondary | ICD-10-CM | POA: Diagnosis present

## 2021-01-05 DIAGNOSIS — Z8249 Family history of ischemic heart disease and other diseases of the circulatory system: Secondary | ICD-10-CM

## 2021-01-05 DIAGNOSIS — E8809 Other disorders of plasma-protein metabolism, not elsewhere classified: Secondary | ICD-10-CM | POA: Diagnosis present

## 2021-01-05 DIAGNOSIS — N170 Acute kidney failure with tubular necrosis: Secondary | ICD-10-CM | POA: Diagnosis present

## 2021-01-05 DIAGNOSIS — F411 Generalized anxiety disorder: Secondary | ICD-10-CM | POA: Diagnosis present

## 2021-01-05 DIAGNOSIS — N189 Chronic kidney disease, unspecified: Secondary | ICD-10-CM

## 2021-01-05 DIAGNOSIS — R509 Fever, unspecified: Secondary | ICD-10-CM

## 2021-01-05 DIAGNOSIS — Z6833 Body mass index (BMI) 33.0-33.9, adult: Secondary | ICD-10-CM

## 2021-01-05 DIAGNOSIS — Z7984 Long term (current) use of oral hypoglycemic drugs: Secondary | ICD-10-CM

## 2021-01-05 DIAGNOSIS — F40240 Claustrophobia: Secondary | ICD-10-CM | POA: Diagnosis present

## 2021-01-05 DIAGNOSIS — E119 Type 2 diabetes mellitus without complications: Secondary | ICD-10-CM | POA: Diagnosis not present

## 2021-01-05 DIAGNOSIS — R0602 Shortness of breath: Secondary | ICD-10-CM

## 2021-01-05 DIAGNOSIS — E669 Obesity, unspecified: Secondary | ICD-10-CM | POA: Diagnosis present

## 2021-01-05 DIAGNOSIS — R7401 Elevation of levels of liver transaminase levels: Secondary | ICD-10-CM | POA: Diagnosis not present

## 2021-01-05 DIAGNOSIS — I2721 Secondary pulmonary arterial hypertension: Secondary | ICD-10-CM | POA: Diagnosis present

## 2021-01-05 DIAGNOSIS — E1122 Type 2 diabetes mellitus with diabetic chronic kidney disease: Secondary | ICD-10-CM

## 2021-01-05 DIAGNOSIS — A481 Legionnaires' disease: Principal | ICD-10-CM | POA: Diagnosis present

## 2021-01-05 DIAGNOSIS — J9601 Acute respiratory failure with hypoxia: Secondary | ICD-10-CM | POA: Diagnosis present

## 2021-01-05 DIAGNOSIS — E1165 Type 2 diabetes mellitus with hyperglycemia: Secondary | ICD-10-CM | POA: Diagnosis present

## 2021-01-05 DIAGNOSIS — I110 Hypertensive heart disease with heart failure: Secondary | ICD-10-CM | POA: Diagnosis present

## 2021-01-05 DIAGNOSIS — Z8371 Family history of colonic polyps: Secondary | ICD-10-CM

## 2021-01-05 DIAGNOSIS — N179 Acute kidney failure, unspecified: Secondary | ICD-10-CM | POA: Diagnosis not present

## 2021-01-05 DIAGNOSIS — J069 Acute upper respiratory infection, unspecified: Secondary | ICD-10-CM

## 2021-01-05 DIAGNOSIS — J189 Pneumonia, unspecified organism: Secondary | ICD-10-CM | POA: Diagnosis not present

## 2021-01-05 DIAGNOSIS — I5030 Unspecified diastolic (congestive) heart failure: Secondary | ICD-10-CM | POA: Diagnosis present

## 2021-01-05 DIAGNOSIS — I129 Hypertensive chronic kidney disease with stage 1 through stage 4 chronic kidney disease, or unspecified chronic kidney disease: Secondary | ICD-10-CM

## 2021-01-05 DIAGNOSIS — J9600 Acute respiratory failure, unspecified whether with hypoxia or hypercapnia: Secondary | ICD-10-CM | POA: Diagnosis present

## 2021-01-05 DIAGNOSIS — I10 Essential (primary) hypertension: Secondary | ICD-10-CM | POA: Diagnosis not present

## 2021-01-05 DIAGNOSIS — Z20822 Contact with and (suspected) exposure to covid-19: Secondary | ICD-10-CM | POA: Diagnosis present

## 2021-01-05 DIAGNOSIS — I5033 Acute on chronic diastolic (congestive) heart failure: Secondary | ICD-10-CM

## 2021-01-05 DIAGNOSIS — D649 Anemia, unspecified: Secondary | ICD-10-CM | POA: Diagnosis present

## 2021-01-05 LAB — CBC WITH DIFFERENTIAL/PLATELET
Abs Immature Granulocytes: 0.15 10*3/uL — ABNORMAL HIGH (ref 0.00–0.07)
Absolute Monocytes: 818 cells/uL (ref 200–950)
Basophils Absolute: 47 cells/uL (ref 0–200)
Basophils Absolute: 0 10*3/uL (ref 0.0–0.1)
Basophils Relative: 0.5 %
Basophils Relative: 0 %
Eosinophils Absolute: 179 cells/uL (ref 15–500)
Eosinophils Absolute: 0.3 10*3/uL (ref 0.0–0.5)
Eosinophils Relative: 1.9 %
Eosinophils Relative: 3 %
HCT: 38.1 % — ABNORMAL LOW (ref 38.5–50.0)
HCT: 33.5 % — ABNORMAL LOW (ref 39.0–52.0)
Hemoglobin: 12.6 g/dL — ABNORMAL LOW (ref 13.2–17.1)
Hemoglobin: 11.5 g/dL — ABNORMAL LOW (ref 13.0–17.0)
Immature Granulocytes: 1 %
Lymphocytes Relative: 5 %
Lymphs Abs: 555 cells/uL — ABNORMAL LOW (ref 850–3900)
Lymphs Abs: 0.5 10*3/uL — ABNORMAL LOW (ref 0.7–4.0)
MCH: 27.8 pg (ref 27.0–33.0)
MCH: 28.4 pg (ref 26.0–34.0)
MCHC: 33.1 g/dL (ref 32.0–36.0)
MCHC: 34.3 g/dL (ref 30.0–36.0)
MCV: 84.1 fL (ref 80.0–100.0)
MCV: 82.7 fL (ref 80.0–100.0)
MPV: 11.5 fL (ref 7.5–12.5)
Monocytes Absolute: 1.1 10*3/uL — ABNORMAL HIGH (ref 0.1–1.0)
Monocytes Relative: 8.7 %
Monocytes Relative: 11 %
Neutro Abs: 7802 cells/uL — ABNORMAL HIGH (ref 1500–7800)
Neutro Abs: 8.3 10*3/uL — ABNORMAL HIGH (ref 1.7–7.7)
Neutrophils Relative %: 83 %
Neutrophils Relative %: 80 %
Platelets: 212 10*3/uL (ref 140–400)
Platelets: 208 10*3/uL (ref 150–400)
RBC: 4.53 10*6/uL (ref 4.20–5.80)
RBC: 4.05 MIL/uL — ABNORMAL LOW (ref 4.22–5.81)
RDW: 12.8 % (ref 11.0–15.0)
RDW: 13.2 % (ref 11.5–15.5)
Total Lymphocyte: 5.9 %
WBC: 9.4 10*3/uL (ref 3.8–10.8)
WBC: 10.4 10*3/uL (ref 4.0–10.5)
nRBC: 0 % (ref 0.0–0.2)

## 2021-01-05 LAB — PATHOLOGIST SMEAR REVIEW

## 2021-01-05 LAB — COMPREHENSIVE METABOLIC PANEL
ALT: 76 U/L — ABNORMAL HIGH (ref 0–44)
AST: 72 U/L — ABNORMAL HIGH (ref 15–41)
Albumin: 3 g/dL — ABNORMAL LOW (ref 3.5–5.0)
Alkaline Phosphatase: 66 U/L (ref 38–126)
Anion gap: 12 (ref 5–15)
BUN: 27 mg/dL — ABNORMAL HIGH (ref 6–20)
CO2: 23 mmol/L (ref 22–32)
Calcium: 8 mg/dL — ABNORMAL LOW (ref 8.9–10.3)
Chloride: 99 mmol/L (ref 98–111)
Creatinine, Ser: 1.48 mg/dL — ABNORMAL HIGH (ref 0.61–1.24)
GFR, Estimated: 54 mL/min — ABNORMAL LOW (ref >60)
Glucose, Bld: 235 mg/dL — ABNORMAL HIGH (ref 70–99)
Potassium: 3.1 mmol/L — ABNORMAL LOW (ref 3.5–5.1)
Sodium: 134 mmol/L — ABNORMAL LOW (ref 135–145)
Total Bilirubin: 0.6 mg/dL (ref 0.3–1.2)
Total Protein: 6.6 g/dL (ref 6.5–8.1)

## 2021-01-05 LAB — RESPIRATORY PANEL BY PCR

## 2021-01-05 LAB — LACTIC ACID, PLASMA: Lactic Acid, Venous: 1.3 mmol/L (ref 0.5–1.9)

## 2021-01-05 LAB — SARS CORONAVIRUS 2 BY RT PCR (HOSPITAL ORDER, PERFORMED IN ~~LOC~~ HOSPITAL LAB): SARS Coronavirus 2: NEGATIVE

## 2021-01-05 LAB — PROCALCITONIN: Procalcitonin: <0.1 ng/mL

## 2021-01-05 LAB — CBG MONITORING, ED: Glucose-Capillary: 185 mg/dL — ABNORMAL HIGH (ref 70–99)

## 2021-01-05 MED ORDER — SODIUM CHLORIDE 0.9 % IV SOLN
100.0000 mg | Freq: Two times a day (BID) | INTRAVENOUS | Status: DC
  Administered 2021-01-06 – 2021-01-07 (×4): 100 mg via INTRAVENOUS
  Filled 2021-01-05 (×7): qty 100

## 2021-01-05 MED ORDER — IRBESARTAN 150 MG PO TABS
150.0000 mg | ORAL_TABLET | Freq: Every day | ORAL | Status: DC
  Administered 2021-01-08 – 2021-01-15 (×6): 150 mg via ORAL
  Filled 2021-01-05 (×11): qty 1

## 2021-01-05 MED ORDER — SODIUM CHLORIDE 0.9 % IV SOLN
2.0000 g | INTRAVENOUS | Status: DC
  Administered 2021-01-06 – 2021-01-08 (×3): 2 g via INTRAVENOUS
  Filled 2021-01-05: qty 2
  Filled 2021-01-05 (×3): qty 20

## 2021-01-05 MED ORDER — ALPRAZOLAM 0.5 MG PO TABS
0.5000 mg | ORAL_TABLET | Freq: Every day | ORAL | Status: DC | PRN
  Administered 2021-01-05 – 2021-01-13 (×7): 0.5 mg via ORAL
  Filled 2021-01-05 (×7): qty 1

## 2021-01-05 MED ORDER — FENTANYL CITRATE (PF) 100 MCG/2ML IJ SOLN
25.0000 ug | INTRAMUSCULAR | Status: DC | PRN
  Administered 2021-01-06: 25 ug via INTRAVENOUS
  Filled 2021-01-05: qty 2

## 2021-01-05 MED ORDER — IBUPROFEN 600 MG PO TABS
600.0000 mg | ORAL_TABLET | Freq: Once | ORAL | Status: AC
  Administered 2021-01-05: 600 mg via ORAL
  Filled 2021-01-05: qty 1

## 2021-01-05 MED ORDER — VALSARTAN-HYDROCHLOROTHIAZIDE 160-12.5 MG PO TABS: 1.0000 | ORAL_TABLET | Freq: Every day | ORAL | Status: DC

## 2021-01-05 MED ORDER — SODIUM CHLORIDE 0.9 % IV SOLN: 500.0000 mg | INTRAVENOUS | Status: DC

## 2021-01-05 MED ORDER — METFORMIN HCL ER 500 MG PO TB24
500.0000 mg | ORAL_TABLET | Freq: Every day | ORAL | Status: DC
  Administered 2021-01-07 – 2021-01-10 (×4): 500 mg via ORAL
  Filled 2021-01-05 (×8): qty 1

## 2021-01-05 MED ORDER — SODIUM CHLORIDE 0.9 % IV SOLN
1.0000 g | Freq: Once | INTRAVENOUS | Status: AC
  Administered 2021-01-05: 1 g via INTRAVENOUS
  Filled 2021-01-05: qty 10

## 2021-01-05 MED ORDER — METOPROLOL TARTRATE 5 MG/5ML IV SOLN: 5.0000 mg | INTRAVENOUS | Status: DC | PRN

## 2021-01-05 MED ORDER — LACTATED RINGERS IV SOLN: INTRAVENOUS | Status: AC

## 2021-01-05 MED ORDER — IBUPROFEN 400 MG PO TABS
400.0000 mg | ORAL_TABLET | ORAL | Status: DC | PRN
Start: 2021-01-05 — End: 2021-01-05

## 2021-01-05 MED ORDER — ONDANSETRON HCL 4 MG/2ML IJ SOLN: 4.0000 mg | Freq: Four times a day (QID) | INTRAMUSCULAR | Status: DC | PRN

## 2021-01-05 MED ORDER — ACETAMINOPHEN 325 MG RE SUPP: 325.0000 mg | Freq: Four times a day (QID) | RECTAL | Status: DC | PRN

## 2021-01-05 MED ORDER — LORAZEPAM 2 MG/ML IJ SOLN
1.0000 mg | Freq: Four times a day (QID) | INTRAMUSCULAR | Status: AC | PRN
  Administered 2021-01-06 – 2021-01-07 (×2): 1 mg via INTRAVENOUS
  Filled 2021-01-05 (×2): qty 1

## 2021-01-05 MED ORDER — ENOXAPARIN SODIUM 40 MG/0.4ML ~~LOC~~ SOLN: 40.0000 mg | SUBCUTANEOUS | Status: DC

## 2021-01-05 MED ORDER — INSULIN ASPART 100 UNIT/ML ~~LOC~~ SOLN
0.0000 [IU] | Freq: Every day | SUBCUTANEOUS | Status: DC
  Administered 2021-01-06: 2 [IU] via SUBCUTANEOUS
  Administered 2021-01-07: 3 [IU] via SUBCUTANEOUS
  Filled 2021-01-05 (×2): qty 1

## 2021-01-05 MED ORDER — POTASSIUM CHLORIDE CRYS ER 20 MEQ PO TBCR
20.0000 meq | EXTENDED_RELEASE_TABLET | Freq: Two times a day (BID) | ORAL | Status: DC
  Administered 2021-01-05 – 2021-01-12 (×12): 20 meq via ORAL
  Filled 2021-01-05 (×15): qty 1

## 2021-01-05 MED ORDER — HYDROCHLOROTHIAZIDE 12.5 MG PO CAPS
12.5000 mg | ORAL_CAPSULE | Freq: Every day | ORAL | Status: DC
  Administered 2021-01-07 – 2021-01-11 (×5): 12.5 mg via ORAL
  Filled 2021-01-05 (×7): qty 1

## 2021-01-05 MED ORDER — GABAPENTIN 300 MG PO CAPS
300.0000 mg | ORAL_CAPSULE | Freq: Every day | ORAL | Status: DC
  Administered 2021-01-05 – 2021-01-14 (×10): 300 mg via ORAL
  Filled 2021-01-05 (×10): qty 1

## 2021-01-05 MED ORDER — ACETAMINOPHEN 325 MG PO TABS
325.0000 mg | ORAL_TABLET | Freq: Four times a day (QID) | ORAL | Status: DC | PRN
  Administered 2021-01-05 – 2021-01-06 (×2): 325 mg via ORAL
  Filled 2021-01-05 (×2): qty 1

## 2021-01-05 MED ORDER — INSULIN ASPART 100 UNIT/ML ~~LOC~~ SOLN
0.0000 [IU] | Freq: Three times a day (TID) | SUBCUTANEOUS | Status: DC
  Administered 2021-01-06: 2 [IU] via SUBCUTANEOUS
  Administered 2021-01-06: 1 [IU] via SUBCUTANEOUS
  Administered 2021-01-07: 5 [IU] via SUBCUTANEOUS
  Administered 2021-01-07: 7 [IU] via SUBCUTANEOUS
  Administered 2021-01-07: 3 [IU] via SUBCUTANEOUS
  Administered 2021-01-08: 2 [IU] via SUBCUTANEOUS
  Administered 2021-01-08 (×2): 3 [IU] via SUBCUTANEOUS
  Administered 2021-01-09 (×2): 1 [IU] via SUBCUTANEOUS
  Administered 2021-01-10: 2 [IU] via SUBCUTANEOUS
  Administered 2021-01-10: 1 [IU] via SUBCUTANEOUS
  Administered 2021-01-10 – 2021-01-11 (×3): 2 [IU] via SUBCUTANEOUS
  Administered 2021-01-11 – 2021-01-13 (×5): 1 [IU] via SUBCUTANEOUS
  Administered 2021-01-15: 2 [IU] via SUBCUTANEOUS
  Filled 2021-01-05 (×21): qty 1

## 2021-01-05 MED ORDER — SODIUM CHLORIDE 0.9 % IV SOLN: 1.0000 g | INTRAVENOUS | Status: DC

## 2021-01-05 MED ORDER — ONDANSETRON HCL 4 MG PO TABS: 4.0000 mg | ORAL_TABLET | Freq: Four times a day (QID) | ORAL | Status: DC | PRN

## 2021-01-05 MED ORDER — SODIUM CHLORIDE 0.9 % IV SOLN
500.0000 mg | Freq: Once | INTRAVENOUS | Status: DC
  Filled 2021-01-05: qty 500

## 2021-01-05 MED ORDER — ENOXAPARIN SODIUM 60 MG/0.6ML ~~LOC~~ SOLN
0.5000 mg/kg | SUBCUTANEOUS | Status: DC
  Administered 2021-01-05 – 2021-01-14 (×10): 47.5 mg via SUBCUTANEOUS
  Filled 2021-01-05 (×11): qty 0.6

## 2021-01-05 MED ORDER — CITALOPRAM HYDROBROMIDE 20 MG PO TABS
20.0000 mg | ORAL_TABLET | Freq: Every day | ORAL | Status: DC
  Administered 2021-01-07 – 2021-01-15 (×9): 20 mg via ORAL
  Filled 2021-01-05 (×11): qty 1

## 2021-01-05 NOTE — ED Triage Notes (Signed)
Fever x approx one week. Diagnosed with PNA, likely bacteria. Patient has been on Augmentin. Patient has since developed cough with shortness of breath. Patient states O2 sats at home were 87% while standing in kitchen. Also reports fever of 101-103.

## 2021-01-05 NOTE — H&P (Addendum)
History and Physical   [name]:[id number] DOB: December 27, 1960 DOA: 01/05/2021  PCP: Ria Bush, MD  Patient coming from: Home  I have personally briefly reviewed patient's old medical records in [place].  Chief Concern: Weakness, chills, fever, myalgia  HPI: Jermaine Harris is a 60 y.o. male with medical history significant for hypertension, NIDDM, recent international travel to [place], hypertension, anxiety/depression presented to the emergency department for chief concerns of myalgia fever and chills.  He reports t max of 102.5, chills, cough, nonproductive, that started Wednesday 01/30/21.  Per patient, fever cycles q4 hours along with diaphoresis and chills.  Patient came back from 12/26/20 from [place] (8 days) on a class trip.  Patient is unsure of any members of the trip had similar symptoms.  Spouse at bedside is currently working on asking.  He has never felt this way before. He endroses light headedness and dizziness with coughing. He gets short of breath with exertion.   Vaccinations: full child vaccinations, and adult vaccinations including pfizer with booster. Booster was March 2021.   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  He lives with his spouse.  He endorses etoh intake, one glass of wine per night 3-4x/week. He denies recreational drugs and tobacco use.  ROS: Constitutional: no weight change,+ fever ENT/Mouth: no sore throat, no rhinorrhea Eyes: no eye pain, no vision changes Cardiovascular: no chest pain, + dyspnea,  no edema, no palpitations Respiratory: + cough, no sputum, no wheezing Gastrointestinal: no nausea, no vomiting, no diarrhea, no constipation Genitourinary: no urinary incontinence, no dysuria, no hematuria Musculoskeletal: no arthralgias, no myalgias Skin: no skin lesions, no pruritus, Neuro: + weakness, no loss of consciousness, no syncope Psych: no anxiety, no depression, + decrease  appetite Heme/Lymph: no bruising, no bleeding  ED Course: Discussed with ED provider, patient required hospitalization due to shortness of breath with mild hypoxia in setting of recent international travel.  Assessment/Plan  Active Problems:   PNA (pneumonia)   Cycling of fever -Malaria is unlikely to be the case as it is currently winter in [place] and there is no mosquitoes -Patient denies mosquito bites -Infectious disease has been consulted and recommends: MERS COV testing, RESP viral PCR with 20 pathogens, SARS COV2 antibody, Blood cultures, and pulmonology consult as patient may need bronchoscopy if blood tests do not yield an answer -May need CT chest -Infectious disease change azithromycin to doxycycline -MRSA of the nares -A.m. team to consult pulmonology per ID request  Not insulin-dependent diabetes mellitus-resumed home Metformin - Patient stopped taking metformin on his own about 2-3 weeks ago - Insulin SSI with HS coverage ordered -Diabetes coordinator and registered dietitian ordered -Insulin SSI  AKI -patient has had poor p.o. intake for the last 7 days and has been intermittently taking acetaminophen and ibuprofen for fever control -LR IVF at 125 cc/h for 12 hours -Avoid nephrotoxic agents -BMP in the a.m.  Pneumonia-ceftriaxone and doxycycline  Hypertension-resumed home antihypertensive medications  Anxiety/depression-resumed home Xanax 0.5 mg daily as needed for anxiety, citalopram 20 mg p.o. daily  As needed medications: Ondansetron, Ativan IV, fentanyl, acetaminophen  Chart reviewed.   DVT prophylaxis: enoxaparin  Code Status: full code Diet: heart healthy/carb modified Family Communication: updated spouse at bedside Disposition Plan: pending clinical course Consults called: Infectious disease Admission status: Observation with telemetry  Past Medical History:  Diagnosis Date  . Actinic keratosis   . Claustrophobia   . GAD (generalized anxiety  disorder)  claustrophobia, some panic attacks  . HTN (hypertension)   . Irritable bowel syndrome   . Lumbar herniated disc 06/2015  . Seasonal allergies    Past Surgical History:  Procedure Laterality Date  . COLON SURGERY    . COLONOSCOPY  2012   mild diverticulosis, rec rpt 5 yrs 2/2 fmhx polyps  . COLONOSCOPY WITH PROPOFOL N/A 05/29/2019   TA, diverticulosis, rpt 5 yrs (Bonna Gains, Lennette Bihari, MD)  . MICRODISCECTOMY LUMBAR  07/2015   L4/5/S1 Trenton Gammon)  . TONSILLECTOMY     Social History:  reports that he has never smoked. He has never used smokeless tobacco. He reports current alcohol use. He reports that he does not use drugs.  Allergies  Allergen Reactions  . Ace Inhibitors Cough   Family History  Problem Relation Age of Onset  . Heart disease Father        pacer and CHF  . Depression Father   . Transient ischemic attack Mother 97       several  . Depression Mother   . Colon polyps Sister   . Sudden death Maternal Grandfather 40       ?mustard gas   Family history: Family history reviewed and not pertinent  Prior to Admission medications   Medication Sig Start Date End Date Taking? Authorizing Provider  ALPRAZolam (NIRAVAM) 0.5 MG dissolvable tablet Take 1 tablet (0.5 mg total) by mouth daily as needed for anxiety (plane rides). 02/22/18   Ria Bush, MD  amoxicillin-clavulanate (AUGMENTIN) 875-125 MG tablet Take 1 tablet by mouth 2 (two) times daily. 01/02/21   [provider]  citalopram (CELEXA) 20 MG tablet Take 1 tablet (20 mg total) by mouth daily. 06/25/20   Ria Bush, MD  gabapentin (NEURONTIN) 300 MG capsule TAKE 1 CAPSULE BY MOUTH EVERYDAY AT BEDTIME 01/03/20   Ria Bush, MD  ibuprofen (ADVIL,MOTRIN) 200 MG tablet Take 200 mg by mouth as needed.    [provider]  metFORMIN (GLUCOPHAGE XR) 500 MG 24 hr tablet Take 1 tablet (500 mg total) by mouth daily. 10/13/20   Ria Bush, MD  valsartan-hydrochlorothiazide  (DIOVAN-HCT) 160-12.5 MG tablet TAKE 1 TABLET BY MOUTH EVERY DAY 12/30/20   Ria Bush, MD   Physical Exam: Vitals:   01/05/21 2000 01/05/21 2015 01/05/21 2100 01/05/21 2200  BP: 129/72  111/60 117/60  Pulse: 73  69 81  Resp: 17  (!) 22 (!) 22  Temp:  100 F (37.8 C)    TempSrc:  Oral    SpO2: 95%  95% 94%  Weight:      Height:       Constitutional: appears age-appropriate, NAD, calm, comfortable Eyes: PERRL, lids and conjunctivae normal ENMT: Mucous membranes are dry. Posterior pharynx clear of any exudate or lesions. Age-appropriate dentition. Hearing appropriate Neck: normal, supple, no masses, no thyromegaly Respiratory: clear to auscultation bilaterally, no wheezing, no crackles. Normal respiratory effort. No accessory muscle use.  Cardiovascular: Regular rate and rhythm, no murmurs / rubs / gallops. No extremity edema. 2+ pedal pulses. No carotid bruits.  Abdomen: Obese abdomen, no tenderness, no masses palpated, no hepatosplenomegaly. Bowel sounds positive.  Musculoskeletal: no clubbing / cyanosis. No joint deformity upper and lower extremities. Good ROM, no contractures, no atrophy. Normal muscle tone.  Skin: no rashes, lesions, ulcers. No induration Neurologic: Sensation intact. Strength 5/5 in all 4.  Psychiatric: Normal judgment and insight. Alert and oriented x 3. Normal mood.   EKG: independently reviewed, showing sinus rhythm with rate of 87, LVH,  QTC 459  Chest x-ray on Admission: I personally reviewed and I agree with radiologist reading as below.  DG Chest 2 View  Result Date: 01/05/2021 CLINICAL DATA:  Question normal sepsis.  Fever EXAM: CHEST - 2 VIEW COMPARISON:  01/04/2021 FINDINGS: Right lower lobe infiltrate posteriorly unchanged from the prior study. No significant pleural effusion. Mild left lower lobe airspace disease unchanged. Negative for heart failure IMPRESSION: Bibasilar infiltrates right greater than left unchanged. Electronically Signed   By:  Franchot Gallo M.D.   On: 01/05/2021 15:14   DG Chest 2 View  Result Date: 01/05/2021 CLINICAL DATA:  Dry cough, fever for 7 8 days EXAM: CHEST - 2 VIEW COMPARISON:  None. FINDINGS: The heart size and mediastinal contours are within normal limits. Heterogeneous airspace opacity of the dependent right lower lobe. The visualized skeletal structures are unremarkable. IMPRESSION: Heterogeneous airspace opacity of the dependent right lower lobe, consistent with infection or aspiration. Recommend follow-up radiographs in 6-8 weeks to ensure complete radiographic resolution and exclude underlying malignancy. Electronically Signed   By: Eddie Candle M.D.   On: 01/05/2021 08:19   Labs on Admission: I have personally reviewed following labs  CBC: Recent Labs  Lab 01/04/21 1306 01/05/21 1428  WBC 9.4 10.4  NEUTROABS 7,802* 8.3*  HGB 12.6* 11.5*  HCT 38.1* 33.5*  MCV 84.1 82.7  PLT 212 161   Basic Metabolic Panel: Recent Labs  Lab 01/04/21 1306 01/05/21 1428  NA 135 134*  K 3.5 3.1*  CL 99 99  CO2 24 23  GLUCOSE 237* 235*  BUN 24* 27*  CREATININE 1.52* 1.48*  CALCIUM 8.3* 8.0*   GFR: Estimated Creatinine Clearance: 58.9 mL/min (A) (by C-G formula based on SCr of 1.48 mg/dL (H)). Liver Function Tests: Recent Labs  Lab 01/04/21 1306 01/05/21 1428  AST 26 72*  ALT 38 76*  ALKPHOS 64 66  BILITOT 0.6 0.6  PROT 6.0 6.6  ALBUMIN 3.4* 3.0*   Abass Misener N Roza Creamer D.O. [facility]  If 7PM-7AM, please contact overnight-coverage provider If 7AM-7PM, please contact day coverage provider [email or web address]  01/05/2021, 10:58 PM

## 2021-01-05 NOTE — Telephone Encounter (Signed)
Please see note from Dr Darnell Level below the access nurse note and per chart review tab pt is at [facility] ED now. fYI to Dr Darnell Level.

## 2021-01-05 NOTE — ED Provider Notes (Signed)
-----------------------------------------   5:13 PM on 01/05/2021 -----------------------------------------  Patient care assumed from Dr. Corky Downs.  Patient's labs show a normal white blood cell count, normal lactic acid, negative procalcitonin.  Patient continues to sat around 90 to 94%, will occasionally drop lower than 90.  Patient is febrile around 100 degrees despite taking ibuprofen regularly.  Patient is on day 8 of symptoms but denies any significant improvement.  Patient recently returned from [place], no other known sick patients from his group [employer].  Lives with his wife who is not ill either.  Given the patient's recent international travel and labs which are somewhat indicative of viral pathogen I spoke to infectious diseases Dr. Steva Ready regarding the patient and possible [place] respiratory syndrome.  We will check for influenza a and B, perform an additional Covid swab with oral pharyngeal sample (patient is vaccinated and boosted), obtain a respiratory viral panel, urine antigens for Legionella and strep pneumonia.  Given the patient's intermittent hypoxia with worsening symptoms despite 8 days of illness we will place the patient on IV antibiotics to cover for community-acquired pneumonia while awaiting the remainder of the patient's viral panel.  Patient will be admitted to the hospital service for further work-up and treatment.  Blood cultures were sent yesterday from his primary care doctor.   Harvest Dark, MD 01/06/21 [phone]

## 2021-01-05 NOTE — Telephone Encounter (Signed)
Spoke with pt. CXR showing RLL PNA. He is already on augmentin course.  He does have pulse ox at home - O2 sats down to 87% with noted increased dyspnea.  Recent labs showing mild ARF.  On augmentin for the past 3 days. Will need abx adjustment as well as possible IVF and supplemental O2.  Spoke with patient and wife. Do rec ER eval. He is on his way to [facility] ER.  I called [place] charge nurse to notify pt on his way.

## 2021-01-05 NOTE — Telephone Encounter (Signed)
Jermaine Harris said pts wife (DPR signed) is on phone and pt has SOB and was advised by access nurse to go to ED and Mrs Strahm wants to talk with Dr Aram Beecham spoke with Dr Darnell Level and was advised for pt to be triaged (access nurse has done and if advised to go to ED pt should go to ED). I spoke with Mr & Mrs Manfredo on speaker phone; pt said he has already answered questions with one nurse and did say his temp was 101 after taking tylenol this AM and ibuprofen at noon.  I asked pt if he felt SOB or having CP and he said he had already been asked these question and pt wanted to know if could get oxygen in the home and pts wife wanted to know CXR report. Dr Darnell Level said he wold speak with pt and pt appreciative and call warm transferred to Dr Darnell Level.

## 2021-01-05 NOTE — Consult Note (Addendum)
NAME: Jermaine Harris  DOB: [birth year]  MRN: [id number]  Date/Time: 01/05/2021 8:42 PM  REQUESTING PROVIDER: Kerman Passey Subjective:  REASON FOR CONSULT: respiratory illness ? Jermaine Harris is a 60 y.o. male  with a history of HTN, DM presents with fever of 1 weeks duration cough of 1 week and worsening sob of 2 days. 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 He left for [place] on 12/17/20 and reaced there eon 12/18/20 , stayed in [place] for a week, mostly had meeting in the hotel and visited certain places like [employer] . He had taken a Jeep Ride on the [employer] and went to watch flame throw and dance. Did not ride any camel- was not close to a camel, though some of his students rode on the came back. He spent a day in [place] and returned to the [place] on 12/26/20. He went to work on 1/24, 1/25. He started having a mild cough on 1/24 and on 1/25 was not feeling well, developed high fever from 1/26. Had a covid test rapid antigen  on 12/31/20 and it was negative. Had a televist with NP . A RT PCR was sent and as he consulted with a physician friend who gave him augmentin. RT PCR  was neg . He had a televist with his PCP Dr.Gutierrez on 1/31 and blood culture sent, CXR showed rt lower lobe opacity  He came to the ED today as his oxygen started to drop at home . He has been taking every 4 hours ibuprofen/advil , alternating with tylenol. In the ED vitals temp 100 later 102, HR 85, RR20, sats 92% CXR showed b/l infiltrate COVID test neg Started on ceftriaxone and azithromycin Pt has no sick contacts Triple vaccinated for covid No animal bites. No tick bites No mosquito bites Did not take any prophylaxis while in [place] No exposure to farm animals Did not consume raw meat or milk No swimming when in [place]   Past Medical History:  Diagnosis Date  . Actinic keratosis   . Claustrophobia   . GAD (generalized anxiety disorder)     claustrophobia, some panic attacks  . HTN (hypertension)   . Irritable bowel syndrome   . Lumbar herniated disc 06/2015  . Seasonal allergies     Past Surgical History:  Procedure Laterality Date  . COLON SURGERY    . COLONOSCOPY  2012   mild diverticulosis, rec rpt 5 yrs 2/2 fmhx polyps  . COLONOSCOPY WITH PROPOFOL N/A 05/29/2019   TA, diverticulosis, rpt 5 yrs (Bonna Gains, Lennette Bihari, MD)  . MICRODISCECTOMY LUMBAR  07/2015   L4/5/S1 Trenton Gammon)  . TONSILLECTOMY      Social History   Socioeconomic History  . Marital status: Married    Spouse name: Not on file  . Number of children: Not on file  . Years of education: Not on file  . Highest education level: Not on file  Occupational History  . Not on file  Tobacco Use  . Smoking status: Never Smoker  . Smokeless tobacco: Never Used  Vaping Use  . Vaping Use: Never used  Substance and Sexual Activity  . Alcohol use: Yes    Alcohol/week: 0.0 standard drinks    Comment: couple of drinks per week  . Drug use: Never  . Sexual activity: Not on file  Other Topics Concern  . 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 Heart disease Father        pacer and CHF  . Depression Father   . Transient ischemic attack Mother 67       several  . Depression Mother   . Colon polyps Sister   . Sudden death Maternal Grandfather 56       ?mustard gas   Allergies  Allergen Reactions  .  Ace Inhibitors Cough    ? Current Facility-Administered Medications  Medication Dose Route Frequency Provider Last Rate Last Admin  . acetaminophen (TYLENOL) tablet 325 mg  325 mg Oral Q6H PRN Cox, Amy N, DO   325 mg at 01/05/21 9924   Or  . acetaminophen (TYLENOL) suppository 325 mg  325 mg Rectal Q6H PRN Cox, Amy N, DO      . ALPRAZolam (XANAX) tablet 0.5 mg  0.5 mg Oral Daily PRN Cox, Amy N, DO      . [START ON 01/06/2021] azithromycin (ZITHROMAX) 500 mg in sodium chloride 0.9 % 250 mL IVPB  500 mg Intravenous Q24H Cox, Amy N, DO      . [START ON 01/06/2021] cefTRIAXone (ROCEPHIN) 1 g in sodium chloride 0.9 % 100 mL IVPB  1 g Intravenous Q24H Cox, Amy N, DO      . [START ON 01/06/2021] citalopram (CELEXA) tablet 20 mg  20 mg Oral Daily Cox, Amy N, DO      . enoxaparin (LOVENOX) injection 47.5 mg  0.5 mg/kg Subcutaneous Q24H Cox, Amy N, DO      . fentaNYL (SUBLIMAZE) injection 25 mcg  25 mcg Intravenous Q4H PRN Cox, Amy N, DO      . gabapentin (NEURONTIN) capsule 300 mg  300 mg Oral QHS Cox, Amy N, DO      . [START ON 01/06/2021] irbesartan (AVAPRO) tablet 150 mg  150 mg Oral Daily Cox, Amy N, DO       And  . [START ON 01/06/2021] hydrochlorothiazide (MICROZIDE) capsule 12.5 mg  12.5 mg Oral Daily Cox, Amy N, DO      . ibuprofen (ADVIL) tablet 400 mg  400 mg Oral Q4H PRN Cox, Amy N, DO      . LORazepam (ATIVAN) injection 1 mg  1 mg Intravenous Q6H PRN Cox, Amy N, DO      . [START ON 01/06/2021] metFORMIN (GLUCOPHAGE-XR) 24 hr tablet 500 mg  500 mg Oral Daily Cox, Amy N, DO      . metoprolol tartrate (LOPRESSOR) injection 5 mg  5 mg Intravenous Q4H PRN Cox, Amy N, DO      . ondansetron (ZOFRAN) tablet 4 mg  4 mg Oral Q6H PRN Cox, Amy N, DO       Or  . ondansetron (ZOFRAN) injection 4 mg  4 mg Intravenous Q6H PRN Cox, Amy N, DO       Current Outpatient Medications  Medication Sig Dispense Refill  . ALPRAZolam (NIRAVAM) 0.5  MG dissolvable tablet Take 1 tablet (0.5 mg total) by mouth daily as needed  for anxiety (plane rides). 20 tablet 0  . amoxicillin-clavulanate (AUGMENTIN) 875-125 MG tablet Take 1 tablet by mouth 2 (two) times daily.    . citalopram (CELEXA) 20 MG tablet Take 1 tablet (20 mg total) by mouth daily. 90 tablet 3  . gabapentin (NEURONTIN) 300 MG capsule TAKE 1 CAPSULE BY MOUTH EVERYDAY AT BEDTIME 90 capsule 3  . ibuprofen (ADVIL,MOTRIN) 200 MG tablet Take 200 mg by mouth as needed.    . metFORMIN (GLUCOPHAGE XR) 500 MG 24 hr tablet Take 1 tablet (500 mg total) by mouth daily. 30 tablet 3  . valsartan-hydrochlorothiazide (DIOVAN-HCT) 160-12.5 MG tablet TAKE 1 TABLET BY MOUTH EVERY DAY 90 tablet 1     Abtx:  Anti-infectives (From admission, onward)   Start     Dose/Rate Route Frequency Ordered Stop   01/06/21 2000  azithromycin (ZITHROMAX) 500 mg in sodium chloride 0.9 % 250 mL IVPB        500 mg 250 mL/hr over 60 Minutes Intravenous Every 24 hours 01/05/21 1843 01/10/21 1959   01/06/21 0600  cefTRIAXone (ROCEPHIN) 1 g in sodium chloride 0.9 % 100 mL IVPB        1 g 200 mL/hr over 30 Minutes Intravenous Every 24 hours 01/05/21 1843     01/05/21 1715  cefTRIAXone (ROCEPHIN) 1 g in sodium chloride 0.9 % 100 mL IVPB        1 g 200 mL/hr over 30 Minutes Intravenous  Once 01/05/21 1704 01/05/21 1843   01/05/21 1715  azithromycin (ZITHROMAX) 500 mg in sodium chloride 0.9 % 250 mL IVPB  Status:  Discontinued        500 mg 250 mL/hr over 60 Minutes Intravenous  Once 01/05/21 1704 01/05/21 1847      REVIEW OF SYSTEMS:  Const: fever,  chills, negative weight loss Eyes: negative diplopia or visual changes, negative eye pain ENT: negative coryza, negative sore throat Resp:  cough, non productive  dyspnea Cards: negative for chest pain, palpitations, lower extremity edema GU: negative for frequency, dysuria and hematuria GI: Negative for abdominal pain, diarrhea, bleeding, constipation Skin: negative for rash and pruritus Heme: negative for easy bruising and gum/nose  bleeding MS:  myalgias, arthralgias, back pain and muscle weakness Neurolo:headaches, mild dizziness on coughing, vertigo, memory problems  Psych:  anxiety, depression  Endocrine: , diabetes Allergy/Immunology- ACE inhibitors Objective:  VITALS:  BP 129/72   Pulse 73   Temp 100 F (37.8 C) (Oral)   Resp 17   Ht 5' 7.25" (1.708 m)   Wt 96.2 kg   SpO2 95%   BMI 32.96 kg/m  PHYSICAL EXAM:  General: Alert, sweating, appears stated age.  Head: Normocephalic, without obvious abnormality, atraumatic. Eyes: Conjunctivae clear, anicteric sclerae. Pupils are equal ENT Nares normal. No drainage or sinus tenderness. Lips, mucosa, and tongue normal. No Thrush Neck: Supple, symmetrical, no adenopathy, thyroid: non tender no carotid bruit and no JVD. Back: No CVA tenderness. Lungs:b/l air entry- crepts bases Heart: Regular rate and rhythm, no murmur, rub or gallop. Abdomen: Soft, non-tender,not distended. Bowel sounds normal. No masses Extremities: atraumatic, no cyanosis. No edema. No clubbing Skin: No rashes or lesions. Or bruising, faint erythema -  Lymph: Cervical, supraclavicular normal. Neurologic: Grossly non-focal Pertinent Labs Lab Results CBC    Component Value Date/Time   WBC 10.4 01/05/2021 1428   RBC 4.05 (L) 01/05/2021 1428   HGB 11.5 (L) 01/05/2021 1428  HCT 33.5 (L) 01/05/2021 1428   PLT 208 01/05/2021 1428   MCV 82.7 01/05/2021 1428   MCH 28.4 01/05/2021 1428   MCHC 34.3 01/05/2021 1428   RDW 13.2 01/05/2021 1428   LYMPHSABS 0.5 (L) 01/05/2021 1428   MONOABS 1.1 (H) 01/05/2021 1428   EOSABS 0.3 01/05/2021 1428   BASOSABS 0.0 01/05/2021 1428    CMP Latest Ref Rng & Units 01/05/2021 01/04/2021 06/25/2020  Glucose 70 - 99 mg/dL 235(H) 237(H) 125(H)  BUN 6 - 20 mg/dL 27(H) 24(H) 25(H)  Creatinine 0.61 - 1.24 mg/dL 1.48(H) 1.52(H) 1.18  Sodium 135 - 145 mmol/L 134(L) 135 139  Potassium 3.5 - 5.1 mmol/L 3.1(L) 3.5 4.3  Chloride 98 - 111 mmol/L 99 99 105  CO2 22 -  32 mmol/L 23 24 29   Calcium 8.9 - 10.3 mg/dL 8.0(L) 8.3(L) 9.2  Total Protein 6.5 - 8.1 g/dL 6.6 6.0 6.8  Total Bilirubin 0.3 - 1.2 mg/dL 0.6 0.6 0.4  Alkaline Phos 38 - 126 U/L 66 64 60  AST 15 - 41 U/L 72(H) 26 25  ALT 0 - 44 U/L 76(H) 38 40      Microbiology: Recent Results (from the past 240 hour(s))  Novel Coronavirus, NAA ([facility])     Status: None   Collection Time: 12/31/20  2:35 PM   Specimen: Nasopharyngeal(NP) swabs in vial transport medium   Nasopharynge  Result Value Ref Range Status   SARS-CoV-2, NAA Not Detected Not Detected Final    Comment: This nucleic acid amplification test was developed and its performance characteristics determined by [employer]. Nucleic acid amplification tests include RT-PCR and TMA. This test has not been FDA cleared or approved. This test has been authorized by FDA under an Emergency Use Authorization (EUA). This test is only authorized for the duration of time the declaration that circumstances exist justifying the authorization of the emergency use of in vitro diagnostic tests for detection of SARS-CoV-2 virus and/or diagnosis of COVID-19 infection under section 564(b)(1) of the Act, 21 U.S.C. [id number]) (1), unless the authorization is terminated or revoked sooner. When diagnostic testing is negative, the possibility of a false negative result should be considered in the context of a patient's recent exposures and the presence of clinical signs and symptoms consistent with COVID-19. An individual without symptoms of COVID-19 and who is not shedding SARS-CoV-2 virus wo uld expect to have a negative (not detected) result in this assay.   SARS-COV-2, NAA 2 DAY TAT     Status: None   Collection Time: 12/31/20  2:35 PM   Nasopharynge  Result Value Ref Range Status   SARS-CoV-2, NAA 2 DAY TAT Performed  Final  Culture, blood (single) w Reflex to ID Panel     Status: None (Preliminary result)   Collection Time: 01/04/21   1:18 PM   Specimen: Blood  Result Value Ref Range Status   MICRO NUMBER: [id number]  Preliminary   SPECIMEN QUALITY: Adequate  Preliminary   Source NOT GIVEN  Preliminary   STATUS: PRELIMINARY  Preliminary   Result:   Preliminary    No growth to date. Culture is continuously monitored for a total of 120 hours incubation. A change in status will result in a phone report followed by an updated printed culture report.   COMMENT: Aerobic and anaerobic bottle received.  Preliminary  Culture, blood (single) w Reflex to ID Panel     Status: None (Preliminary result)   Collection Time: 01/04/21  1:18 PM   Specimen: Blood  Result Value Ref Range Status   MICRO NUMBER: [id number]  Preliminary   SPECIMEN QUALITY: Adequate  Preliminary   Source BLOOD  Preliminary   STATUS: PRELIMINARY  Preliminary   Result:   Preliminary    No growth to date. Culture is continuously monitored for a total of 120 hours incubation. A change in status will result in a phone report followed by an updated printed culture report.   COMMENT: Aerobic and anaerobic bottle received.  Preliminary  SARS Coronavirus 2 by RT PCR (hospital order, performed in [facility] hospital lab) Nasopharyngeal Nasopharyngeal Swab     Status: None   Collection Time: 01/05/21  2:32 PM   Specimen: Nasopharyngeal Swab  Result Value Ref Range Status   SARS Coronavirus 2 NEGATIVE NEGATIVE Final    Comment: (NOTE) SARS-CoV-2 target nucleic acids are NOT DETECTED.  The SARS-CoV-2 RNA is generally detectable in upper and lower respiratory specimens during the acute phase of infection. The lowest concentration of SARS-CoV-2 viral copies this assay can detect is 250 copies / mL. A negative result does not preclude SARS-CoV-2 infection and should not be used as the sole basis for treatment or other patient management decisions.  A negative result may occur with improper specimen collection / handling, submission of specimen other than nasopharyngeal  swab, presence of viral mutation(s) within the areas targeted by this assay, and inadequate number of viral copies (<250 copies / mL). A negative result must be combined with clinical observations, patient history, and epidemiological information.  Fact Sheet for Patients:   StrictlyIdeas.no  Fact Sheet for Healthcare Providers: BankingDealers.co.za  This test is not yet approved or  cleared by the [place] FDA and has been authorized for detection and/or diagnosis of SARS-CoV-2 by FDA under an Emergency Use Authorization (EUA).  This EUA will remain in effect (meaning this test can be used) for the duration of the COVID-19 declaration under Section 564(b)(1) of the Act, 21 U.S.C. section 360bbb-3(b)(1), unless the authorization is terminated or revoked sooner.  Performed at [facility], [address] [place], [place] [address]     IMAGING RESULTS:  I have personally reviewed the films ? Impression/Recommendation ?Respiratory illness with fever and cough and b/l infiltrate for the past week consistent with CAP . Recent travel to [place] and fell ill 72-96 hrs after he retuned D.D viral pneumonia SARS COV2, other resp virus like hMPV, RSV, parainflunza MERS COV in the D.D Atypical pneumonia like legionella , mycoplasma in the differential diagnosis Other systemic illness with respiratory system involvement like Leptospirosis unlikely. Q fever less likely Malaria not present in [place] except in expats who are from countries of endemicity Fungal infection like Cocci unlikely  Mild transaminitis could be tylenol effect  No leucocytosis, normal procal and   Recommend MERS COV testing RESP viral PCR SARS COV2 antibody Blood culture Q fever Pulmonary consult- may need Bronch if blood test do not yield an answer May need CT chest--Will change azithromycin to doxycycline MRSA nares   ?AKI on CKD Could be from  NSAID  DM- on metformin  HTN on nifedepine and indapamide   ? ___________________________________________________ Discussed with patient, requesting provider Note:  This document was prepared using Dragon voice recognition software and may include unintentional dictation errors.

## 2021-01-05 NOTE — ED Notes (Signed)
MARS (middle east acquired respiratory syndrome) swab/blood work sent to lab

## 2021-01-05 NOTE — Progress Notes (Signed)
PHARMACIST - PHYSICIAN COMMUNICATION  CONCERNING:  Enoxaparin (Lovenox) for DVT Prophylaxis    RECOMMENDATION: Patient was prescribed enoxaprin 40mg  q24 hours for VTE prophylaxis.   Filed Weights   01/05/21 1359  Weight: 96.2 kg (212 lb)    Body mass index is 32.96 kg/m.  Estimated Creatinine Clearance: 58.9 mL/min (A) (by C-G formula based on SCr of 1.48 mg/dL (H)).   Based on [place] patient is candidate for enoxaparin 0.5mg /kg TBW SQ every 24 hours based on BMI being >30.  DESCRIPTION: Pharmacy has adjusted enoxaparin dose per [facility] policy.  Patient is now receiving enoxaparin 47.5 mg every 24 hours    Berta Minor, PharmD Clinical Pharmacist  01/05/2021 6:45 PM

## 2021-01-05 NOTE — ED Provider Notes (Signed)
[facility] Emergency Department Provider Note   ____________________________________________    I have reviewed the triage vital signs and the nursing notes.   HISTORY  Chief Complaint Shortness of Breath     HPI Jermaine Harris is a 60 y.o. male with history of diabetes on Metformin who presents with complaints of cough, fever, fatigue.  Patient [place] to [place], approximately 6 days ago he developed a headache and chills which developed into fevers and fatigue.  Over the weekend he developed a cough and mild shortness of breath.  He reports his pulse oximeter today was 87% while standing in the kitchen.  He reports his PCP prescribed him Augmentin over the weekend.  He did have a chest x-ray done yesterday which demonstrated an infiltrate in the right lower lung.  He has had both Covid vaccines and a booster  Past Medical History:  Diagnosis Date  . Actinic keratosis   . Claustrophobia   . GAD (generalized anxiety disorder)    claustrophobia, some panic attacks  . HTN (hypertension)   . Irritable bowel syndrome   . Lumbar herniated disc 06/2015  . Seasonal allergies     Patient Active Problem List   Diagnosis Date Noted  . Fever 01/04/2021  . Dyslipidemia associated with type 2 diabetes mellitus (HCC) 06/27/2020  . Polyp of transverse colon   . Diverticulosis of large intestine without diverticulitis   . 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 Erectile dysfunction 09/21/2015  . Degeneration of lumbar or lumbosacral intervertebral disc 06/30/2015  . Left leg numbness 06/23/2015  . Encounter for general adult medical examination with abnormal findings 04/07/2015  . Obesity, Class I, BMI 30-34.9 06/23/2014  . Essential hypertension   . Seasonal allergies   . Irritable bowel syndrome   . GAD (generalized anxiety disorder)     Past  Surgical History:  Procedure Laterality Date  . COLON SURGERY    . COLONOSCOPY  2012   mild diverticulosis, rec rpt 5 yrs 2/2 fmhx polyps  . COLONOSCOPY WITH PROPOFOL N/A 05/29/2019   TA, diverticulosis, rpt 5 yrs (Maximino Greenlandahiliani, Dolphus JennyVarnita B, MD)  . MICRODISCECTOMY LUMBAR  07/2015   L4/5/S1 [place] Quint([place])  . TONSILLECTOMY      Prior to Admission medications   Medication Sig Start Date End Date Taking? Authorizing Provider  ALPRAZolam (NIRAVAM) 0.5 MG dissolvable tablet Take 1 tablet (0.5 mg total) by mouth daily as needed for anxiety (plane rides). 02/22/18   Eustaquio BoydenGutierrez, Javier, MD  amoxicillin-clavulanate (AUGMENTIN) 875-125 MG tablet Take 1 tablet by mouth 2 (two) times daily. 01/02/21   [provider]  citalopram (CELEXA) 20 MG tablet Take 1 tablet (20 mg total) by mouth daily. 06/25/20   Eustaquio BoydenGutierrez, Javier, MD  gabapentin (NEURONTIN) 300 MG capsule TAKE 1 CAPSULE BY MOUTH EVERYDAY AT BEDTIME 01/03/20   Eustaquio BoydenGutierrez, Javier, MD  ibuprofen (ADVIL,MOTRIN) 200 MG tablet Take 200 mg by mouth as needed.    [provider]  metFORMIN (GLUCOPHAGE XR) 500 MG 24 hr tablet Take 1 tablet (500 mg total) by mouth daily. 10/13/20   Eustaquio BoydenGutierrez, Javier, MD  valsartan-hydrochlorothiazide (DIOVAN-HCT) 160-12.5 MG tablet TAKE 1 TABLET BY MOUTH EVERY DAY 12/30/20   Eustaquio BoydenGutierrez, Javier, MD     Allergies Ace inhibitors  Family History  Problem Relation Age of Onset  . Heart disease Father        pacer and CHF  . Depression  Father   . Transient ischemic attack Mother 26       several  . Depression Mother   . Colon polyps Sister   . Sudden death Maternal Grandfather 67       ?mustard gas    Social History Social History   Tobacco Use  . Smoking status: Never Smoker  . Smokeless tobacco: Never Used  Vaping Use  . Vaping Use: Never used  Substance Use Topics  . Alcohol use: Yes    Alcohol/week: 0.0 standard drinks    Comment: couple of drinks per week  . Drug use: Never    Review of  Systems  Constitutional: Positive fever Eyes: No visual changes.  ENT: No sore throat. Cardiovascular: Denies chest pain. Respiratory: As above Gastrointestinal: No abdominal pain.  No nausea, no vomiting.   Genitourinary: Negative for dysuria. Musculoskeletal: Negative for back pain. Skin: Negative for rash. Neurological: Positive headaches   ____________________________________________   PHYSICAL EXAM:  VITAL SIGNS: ED Triage Vitals  Enc Vitals Group     BP 01/05/21 1357 112/65     Pulse Rate 01/05/21 1357 85     Resp 01/05/21 1357 20     Temp 01/05/21 1357 100 F (37.8 C)     Temp Source 01/05/21 1357 Oral     SpO2 01/05/21 1357 92 %     Weight 01/05/21 1359 96.2 kg (212 lb)     Height 01/05/21 1359 1.708 m (5' 7.25")     Head Circumference --      Peak Flow --      Pain Score 01/05/21 1359 0     Pain Loc --      Pain Edu? --      Excl. in [place]? --     Constitutional: Alert and oriented.  Eyes: Conjunctivae are normal.   Nose: No congestion/rhinnorhea. Mouth/Throat: Mucous membranes are moist.   Neck:  Painless ROM Cardiovascular: Normal rate, regular rhythm. Grossly normal heart sounds.  Good peripheral circulation. Respiratory: Normal respiratory effort.  No retractions. Lungs CTAB. Gastrointestinal: Soft and nontender. No distention.    Musculoskeletal: No lower extremity tenderness nor edema.  Warm and well perfused Neurologic:  Normal speech and language. No gross focal neurologic deficits are appreciated.  Skin:  Skin is warm, dry and intact. No rash noted. Psychiatric: Mood and affect are normal. Speech and behavior are normal.  ____________________________________________   LABS (all labs ordered are listed, but only abnormal results are displayed)  Labs Reviewed  COMPREHENSIVE METABOLIC PANEL - Abnormal; Notable for the following components:      Result Value   Sodium 134 (*)    Potassium 3.1 (*)    Glucose, Bld 235 (*)    BUN 27 (*)     Creatinine, Ser 1.48 (*)    Calcium 8.0 (*)    Albumin 3.0 (*)    AST 72 (*)    ALT 76 (*)    GFR, Estimated 54 (*)    All other components within normal limits  CBC WITH DIFFERENTIAL/PLATELET - Abnormal; Notable for the following components:   RBC 4.05 (*)    Hemoglobin 11.5 (*)    HCT 33.5 (*)    Neutro Abs 8.3 (*)    Lymphs Abs 0.5 (*)    Monocytes Absolute 1.1 (*)    Abs Immature Granulocytes 0.15 (*)    All other components within normal limits  SARS CORONAVIRUS 2 BY RT PCR ([facility])  LACTIC  ACID, PLASMA  LACTIC ACID, PLASMA  PROCALCITONIN   ____________________________________________  EKG  ED ECG REPORT I, Lavonia Drafts, the attending physician, personally viewed and interpreted this ECG.  Date: 01/05/2021  Rhythm: normal sinus rhythm QRS Axis: normal Intervals: normal ST/T Wave abnormalities: normal Narrative Interpretation: no evidence of acute ischemia  ____________________________________________  RADIOLOGY  Chest x-ray viewed by me ____________________________________________   PROCEDURES  Procedure(s) performed: No  Procedures   Critical Care performed: No ____________________________________________   INITIAL IMPRESSION / ASSESSMENT AND PLAN / ED COURSE  Pertinent labs & imaging results that were available during my care of the patient were reviewed by me and considered in my medical decision making (see chart for details).  Patient presents with shortness of breath, cough, fever.  Differential includes COVID-19, community-acquired pneumonia, upper respiratory infection.  Oxygen saturations 93% here at rest mildly tachypneic, temperature 100.  Heart rate normal.  Reports he did take a PCR test last week which was negative.  We will repeat Covid PCR here.  We will send procalcitonin.  He actually had blood cultures drawn at his PCPs office yesterday which are in process.  Reviewed lab work and  x-ray performed yesterday.  Will obtain repeat chest x-ray to determine whether any further infiltrates more consistent with Covid pneumonia versus more lobar appearance of bacterial pneumonia.  Patient is not requiring oxygen at this time.    ____________________________________________   FINAL CLINICAL IMPRESSION(S) / ED DIAGNOSES  Final diagnoses:  Shortness of breath  Community acquired pneumonia, unspecified laterality        Note:  This document was prepared using Dragon voice recognition software and may include unintentional dictation errors.   Lavonia Drafts, MD 01/05/21 [phone]

## 2021-01-05 NOTE — Telephone Encounter (Signed)
[facility] - Client TELEPHONE ADVICE RECORD [facility] Patient Name: Jermaine Harris Gender: Male DOB: February 13, 1961 Age: 60 Y 28 M 10 D Return Phone Number: [phone] (Primary), [phone] (Secondary) [place] [address] Client  [facility] - Client Client Site [facility] - Day Physician Ria Bush - MD Contact Type Call Who Is Calling Patient / Member / Family / Caregiver Call Type Triage / Clinical Relationship To Patient Self Return Phone Number [phone] (Primary) Chief Complaint FAINTING or [place] Reason for Call Symptomatic / Request for Lane is light headed, feels like he is going to pass out, o2 at 87. [place] Not Listed Will go to ER. Translation No Nurse Assessment Nurse: Donna Christen, RN, Legrand Como Date/Time Eilene Ghazi Time): 01/05/2021 1:08:14 PM Confirm and document reason for call. If symptomatic, describe symptoms. ---Caller is light headed, feels like he is going to pass out, o2 at 87. Has PNA confirmed by chest x-ray yesterday. Has had a fever for 8-9 days now. Having difficulty breathing. Has non productive cough. Does the patient have any new or worsening symptoms? ---Yes Will a triage be completed? ---Yes Related visit to physician within the last 2 weeks? ---Yes Does the PT have any chronic conditions? (i.e. diabetes, asthma, this includes High risk factors for pregnancy, etc.) ---Yes List chronic conditions. ---HTN Is this a behavioral health or substance abuse call? ---No Guidelines Guideline Title Affirmed Question Affirmed Notes Nurse Date/Time Eilene Ghazi Time) Breathing Difficulty Slow, shallow and weak breathing Darleen Crocker 01/05/2021 1:10:18 PM Disp. Time Eilene Ghazi Time) Disposition Final User 01/05/2021 1:05:33 PM Send to Urgent Ezra Sites 01/05/2021 1:17:03 PM 911 Outcome Documentation  Donna Christen, RN, Legrand Como Reason: will have wife drive him to ER 08/09/6386 1:15:28 PM Call EMS 911 Now Yes Donna Christen, RN, Legrand Como PLEASE NOTE: All timestamps contained within this report are represented as [place] Standard Time. CONFIDENTIALTY NOTICE: This fax transmission is intended only for the addressee. It contains information that is legally privileged, confidential or otherwise protected from use or disclosure. If you are not the intended recipient, you are strictly prohibited from reviewing, disclosing, copying using or disseminating any of this information or taking any action in reliance on or regarding this information. If you have received this fax in error, please notify us immediately by telephone so that we can arrange for its return to [place]. Phone: [phone], Toll-Free: [phone], Fax: [phone] Page: 2 of 2 Call Id: [id number] Caller Disagree/Comply Disagree Caller Understands Yes PreDisposition InappropriateToAsk Care Advice Given Per Guideline CALL EMS 911 NOW: CARE ADVICE given per Breathing Difficulty (Adult) guideline. Comments User: Tresa Moore, RN Date/Time Eilene Ghazi Time): 01/05/2021 1:16:40 PM Patient stated that he was having trouble breathing. Stated he coughed so much in a 2 min span could not catch his breathing. Spo2 is 97% and patient stated he is having shallow breathing. 911 was recommended, patient stated he would have his wife drive him in. Referrals GO TO FACILITY OTHER - SPECIFY

## 2021-01-06 ENCOUNTER — Inpatient Hospital Stay: Payer: BC Managed Care – PPO

## 2021-01-06 DIAGNOSIS — Z79899 Other long term (current) drug therapy: Secondary | ICD-10-CM | POA: Diagnosis not present

## 2021-01-06 DIAGNOSIS — Z8371 Family history of colonic polyps: Secondary | ICD-10-CM | POA: Diagnosis not present

## 2021-01-06 DIAGNOSIS — F40240 Claustrophobia: Secondary | ICD-10-CM | POA: Diagnosis present

## 2021-01-06 DIAGNOSIS — N170 Acute kidney failure with tubular necrosis: Secondary | ICD-10-CM | POA: Diagnosis present

## 2021-01-06 DIAGNOSIS — I10 Essential (primary) hypertension: Secondary | ICD-10-CM | POA: Diagnosis not present

## 2021-01-06 DIAGNOSIS — J9601 Acute respiratory failure with hypoxia: Secondary | ICD-10-CM | POA: Diagnosis present

## 2021-01-06 DIAGNOSIS — J189 Pneumonia, unspecified organism: Secondary | ICD-10-CM | POA: Diagnosis present

## 2021-01-06 DIAGNOSIS — I2721 Secondary pulmonary arterial hypertension: Secondary | ICD-10-CM | POA: Diagnosis present

## 2021-01-06 DIAGNOSIS — Z8249 Family history of ischemic heart disease and other diseases of the circulatory system: Secondary | ICD-10-CM | POA: Diagnosis not present

## 2021-01-06 DIAGNOSIS — E1122 Type 2 diabetes mellitus with diabetic chronic kidney disease: Secondary | ICD-10-CM | POA: Diagnosis not present

## 2021-01-06 DIAGNOSIS — E669 Obesity, unspecified: Secondary | ICD-10-CM | POA: Diagnosis present

## 2021-01-06 DIAGNOSIS — A481 Legionnaires' disease: Secondary | ICD-10-CM | POA: Diagnosis present

## 2021-01-06 DIAGNOSIS — J9600 Acute respiratory failure, unspecified whether with hypoxia or hypercapnia: Secondary | ICD-10-CM | POA: Diagnosis present

## 2021-01-06 DIAGNOSIS — R7401 Elevation of levels of liver transaminase levels: Secondary | ICD-10-CM | POA: Diagnosis present

## 2021-01-06 DIAGNOSIS — R509 Fever, unspecified: Secondary | ICD-10-CM | POA: Diagnosis not present

## 2021-01-06 DIAGNOSIS — N179 Acute kidney failure, unspecified: Secondary | ICD-10-CM | POA: Diagnosis not present

## 2021-01-06 DIAGNOSIS — Z6833 Body mass index (BMI) 33.0-33.9, adult: Secondary | ICD-10-CM | POA: Diagnosis not present

## 2021-01-06 DIAGNOSIS — J069 Acute upper respiratory infection, unspecified: Secondary | ICD-10-CM | POA: Diagnosis not present

## 2021-01-06 DIAGNOSIS — E1165 Type 2 diabetes mellitus with hyperglycemia: Secondary | ICD-10-CM | POA: Diagnosis present

## 2021-01-06 DIAGNOSIS — R0603 Acute respiratory distress: Secondary | ICD-10-CM | POA: Diagnosis not present

## 2021-01-06 DIAGNOSIS — Z20822 Contact with and (suspected) exposure to covid-19: Secondary | ICD-10-CM | POA: Diagnosis present

## 2021-01-06 DIAGNOSIS — I110 Hypertensive heart disease with heart failure: Secondary | ICD-10-CM | POA: Diagnosis present

## 2021-01-06 DIAGNOSIS — E8809 Other disorders of plasma-protein metabolism, not elsewhere classified: Secondary | ICD-10-CM | POA: Diagnosis present

## 2021-01-06 DIAGNOSIS — F419 Anxiety disorder, unspecified: Secondary | ICD-10-CM | POA: Diagnosis not present

## 2021-01-06 DIAGNOSIS — F411 Generalized anxiety disorder: Secondary | ICD-10-CM | POA: Diagnosis present

## 2021-01-06 DIAGNOSIS — Z7984 Long term (current) use of oral hypoglycemic drugs: Secondary | ICD-10-CM | POA: Diagnosis not present

## 2021-01-06 DIAGNOSIS — E119 Type 2 diabetes mellitus without complications: Secondary | ICD-10-CM | POA: Diagnosis not present

## 2021-01-06 DIAGNOSIS — D649 Anemia, unspecified: Secondary | ICD-10-CM | POA: Diagnosis present

## 2021-01-06 DIAGNOSIS — I5033 Acute on chronic diastolic (congestive) heart failure: Secondary | ICD-10-CM | POA: Diagnosis present

## 2021-01-06 DIAGNOSIS — F32A Depression, unspecified: Secondary | ICD-10-CM | POA: Diagnosis present

## 2021-01-06 DIAGNOSIS — J156 Pneumonia due to other aerobic Gram-negative bacteria: Secondary | ICD-10-CM | POA: Diagnosis not present

## 2021-01-06 LAB — MAGNESIUM: Magnesium: 2.1 mg/dL (ref 1.7–2.4)

## 2021-01-06 LAB — MRSA PCR SCREENING: MRSA by PCR: NEGATIVE

## 2021-01-06 LAB — BASIC METABOLIC PANEL
Anion gap: 12 (ref 5–15)
BUN: 23 mg/dL — ABNORMAL HIGH (ref 6–20)
CO2: 22 mmol/L (ref 22–32)
Calcium: 7.8 mg/dL — ABNORMAL LOW (ref 8.9–10.3)
Chloride: 98 mmol/L (ref 98–111)
Creatinine, Ser: 1.23 mg/dL (ref 0.61–1.24)
GFR, Estimated: >60 mL/min (ref >60)
Glucose, Bld: 165 mg/dL — ABNORMAL HIGH (ref 70–99)
Potassium: 3.6 mmol/L (ref 3.5–5.1)
Sodium: 132 mmol/L — ABNORMAL LOW (ref 135–145)

## 2021-01-06 LAB — CBC
HCT: 30 % — ABNORMAL LOW (ref 39.0–52.0)
Hemoglobin: 10.1 g/dL — ABNORMAL LOW (ref 13.0–17.0)
MCH: 27.9 pg (ref 26.0–34.0)
MCHC: 33.7 g/dL (ref 30.0–36.0)
MCV: 82.9 fL (ref 80.0–100.0)
Platelets: 215 10*3/uL (ref 150–400)
RBC: 3.62 MIL/uL — ABNORMAL LOW (ref 4.22–5.81)
RDW: 13.1 % (ref 11.5–15.5)
WBC: 9 10*3/uL (ref 4.0–10.5)
nRBC: 0 % (ref 0.0–0.2)

## 2021-01-06 LAB — HIV ANTIBODY (ROUTINE TESTING W REFLEX): HIV Screen 4th Generation wRfx: NONREACTIVE

## 2021-01-06 LAB — CBG MONITORING, ED
Glucose-Capillary: 139 mg/dL — ABNORMAL HIGH (ref 70–99)
Glucose-Capillary: 156 mg/dL — ABNORMAL HIGH (ref 70–99)
Glucose-Capillary: 217 mg/dL — ABNORMAL HIGH (ref 70–99)

## 2021-01-06 LAB — HEMOGLOBIN A1C
Hgb A1c MFr Bld: 7.8 % — ABNORMAL HIGH (ref 4.8–5.6)
Mean Plasma Glucose: 177.16 mg/dL

## 2021-01-06 LAB — LACTATE DEHYDROGENASE: LDH: 170 U/L (ref 98–192)

## 2021-01-06 LAB — STREP PNEUMONIAE URINARY ANTIGEN: Strep Pneumo Urinary Antigen: NEGATIVE

## 2021-01-06 LAB — PROCALCITONIN: Procalcitonin: 0.73 ng/mL

## 2021-01-06 MED ORDER — SODIUM CHLORIDE 0.9 % IV SOLN
500.0000 mg | INTRAVENOUS | Status: DC
  Administered 2021-01-06 – 2021-01-07 (×2): 500 mg via INTRAVENOUS
  Filled 2021-01-06 (×3): qty 500

## 2021-01-06 MED ORDER — IBUPROFEN 400 MG PO TABS
600.0000 mg | ORAL_TABLET | Freq: Three times a day (TID) | ORAL | Status: DC | PRN
  Administered 2021-01-06: 600 mg via ORAL

## 2021-01-06 MED ORDER — IBUPROFEN 200 MG PO TABS
200.0000 mg | ORAL_TABLET | Freq: Three times a day (TID) | ORAL | Status: DC | PRN
  Filled 2021-01-06: qty 1

## 2021-01-06 MED ORDER — ACETAMINOPHEN 650 MG RE SUPP: 650.0000 mg | Freq: Four times a day (QID) | RECTAL | Status: AC | PRN

## 2021-01-06 MED ORDER — MORPHINE SULFATE (PF) 2 MG/ML IV SOLN
2.0000 mg | INTRAVENOUS | Status: DC | PRN
Start: 2021-01-06 — End: 2021-01-10
  Administered 2021-01-07 – 2021-01-09 (×2): 2 mg via INTRAVENOUS
  Filled 2021-01-06 (×2): qty 1

## 2021-01-06 MED ORDER — METHYLPREDNISOLONE SODIUM SUCC 40 MG IJ SOLR
20.0000 mg | Freq: Two times a day (BID) | INTRAMUSCULAR | Status: DC
  Administered 2021-01-06 – 2021-01-08 (×4): 20 mg via INTRAVENOUS
  Filled 2021-01-06 (×4): qty 1

## 2021-01-06 MED ORDER — IPRATROPIUM-ALBUTEROL 0.5-2.5 (3) MG/3ML IN SOLN
3.0000 mL | RESPIRATORY_TRACT | Status: DC
  Administered 2021-01-06 – 2021-01-09 (×8): 3 mL via RESPIRATORY_TRACT
  Filled 2021-01-06 (×8): qty 3

## 2021-01-06 MED ORDER — MORPHINE SULFATE (PF) 2 MG/ML IV SOLN
INTRAVENOUS | Status: AC
  Administered 2021-01-06: 2 mg via INTRAVENOUS
  Filled 2021-01-06: qty 1

## 2021-01-06 MED ORDER — BUDESONIDE 0.5 MG/2ML IN SUSP
0.5000 mg | Freq: Two times a day (BID) | RESPIRATORY_TRACT | Status: DC
  Administered 2021-01-06 – 2021-01-07 (×2): 0.5 mg via RESPIRATORY_TRACT
  Filled 2021-01-06 (×2): qty 2

## 2021-01-06 MED ORDER — ACETAMINOPHEN 325 MG PO TABS
ORAL_TABLET | ORAL | Status: AC
  Administered 2021-01-06: 325 mg via ORAL
  Filled 2021-01-06: qty 1

## 2021-01-06 MED ORDER — SODIUM CHLORIDE 0.9 % IV SOLN: INTRAVENOUS | Status: DC

## 2021-01-06 MED ORDER — ACETAMINOPHEN 325 MG PO TABS
650.0000 mg | ORAL_TABLET | Freq: Four times a day (QID) | ORAL | Status: AC | PRN
  Administered 2021-01-06 – 2021-01-08 (×3): 650 mg via ORAL
  Filled 2021-01-06 (×4): qty 2

## 2021-01-06 NOTE — Consult Note (Addendum)
Name: Jermaine Harris MRN: [id number] DOB: 05-07-1961     CONSULTATION DATE: 01/06/2021  REFERRING MD : Dwyane Dee  CHIEF COMPLAINT: cough and fevers HISTORY OF PRESENT ILLNESS: 60 y.o. 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   He left for [place] on 12/17/20 and reaced there eon 12/18/20 , stayed in [place] for a week, mostly had meeting in the hotel and visited certain places like [employer] .   He spent a day in [place] and returned to the [place] on 12/26/20.  - went to work on 1/24, 1/25.   1/24-1/25 cough 1/26 high fevers 1/27 COVID rapid AG test NEG Started ABX 1/31 . He had a televist with his PCP Dr.Gutierrez on 1/31 and blood culture sent, CXR showed rt lower lobe opacity   2/1 ID and PULMONARY CONSULTED   ER COURSE CXR showed RLL opacity COVID test neg Started on ceftriaxone and azithromycin  no sick contacts Triple vaccinated for covid Placed on High flow [place] HHF 45L fio2 at 75%-feels better with HHF   CBC    Component Value Date/Time   WBC 9.0 01/06/2021 0748   RBC 3.62 (L) 01/06/2021 0748   HGB 10.1 (L) 01/06/2021 0748   HCT 30.0 (L) 01/06/2021 0748   PLT 215 01/06/2021 0748   MCV 82.9 01/06/2021 0748   MCH 27.9 01/06/2021 0748   MCHC 33.7 01/06/2021 0748   RDW 13.1 01/06/2021 0748   LYMPHSABS 0.5 (L) 01/05/2021 1428   MONOABS 1.1 (H) 01/05/2021 1428   EOSABS 0.3 01/05/2021 1428   BASOSABS 0.0 01/05/2021 1428   BMP Latest Ref Rng & Units 01/06/2021 01/05/2021 01/04/2021  Glucose 70 - 99 mg/dL 165(H) 235(H) 237(H)  BUN 6 - 20 mg/dL 23(H) 27(H) 24(H)  Creatinine 0.61 - 1.24 mg/dL 1.23 1.48(H) 1.52(H)  Sodium 135 - 145 mmol/L 132(L) 134(L) 135  Potassium 3.5 - 5.1 mmol/L 3.6 3.1(L) 3.5  Chloride 98 - 111 mmol/L 98 99 99  CO2 22 - 32 mmol/L 22 23 24    Calcium 8.9 - 10.3 mg/dL 7.8(L) 8.0(L) 8.3(L)    PAST MEDICAL HISTORY :   has a past medical history of Actinic keratosis, Claustrophobia, GAD (generalized anxiety disorder), HTN (hypertension), Irritable bowel syndrome, Lumbar herniated disc (06/2015), and Seasonal allergies.  has a past surgical history that includes Tonsillectomy; Colonoscopy (2012); Microdiscectomy lumbar (07/2015); Colon surgery; and Colonoscopy with propofol (N/A, 05/29/2019). Prior to Admission medications   Medication Sig Start Date End Date Taking? Authorizing Provider  ALPRAZolam (NIRAVAM) 0.5 MG dissolvable tablet Take 1 tablet (0.5 mg total) by mouth daily as needed for anxiety (plane rides). 02/22/18   Ria Bush, MD  amoxicillin-clavulanate (AUGMENTIN) 875-125 MG tablet Take 1 tablet by mouth 2 (two) times daily. 01/02/21   [provider]  citalopram (CELEXA) 20 MG tablet Take 1 tablet (20 mg total) by mouth daily. 06/25/20   Ria Bush, MD  gabapentin (NEURONTIN) 300 MG capsule TAKE 1 CAPSULE BY MOUTH EVERYDAY AT BEDTIME 01/03/20   Ria Bush, MD  ibuprofen (ADVIL,MOTRIN) 200 MG tablet Take 200 mg by mouth as needed.    [provider]  metFORMIN (GLUCOPHAGE XR) 500 MG 24 hr tablet Take 1 tablet (500 mg total) by mouth daily. 10/13/20   Ria Bush, MD  valsartan-hydrochlorothiazide (  DIOVAN-HCT) 160-12.5 MG tablet TAKE 1 TABLET BY MOUTH EVERY DAY 12/30/20   Ria Bush, MD   Allergies  Allergen Reactions  . Ace Inhibitors Cough    FAMILY HISTORY:  family history includes Colon polyps in his sister; Depression in his father and mother; Heart disease in his father; Sudden death (age of onset: 39) in his maternal grandfather; Transient ischemic attack (age of onset: 42) in his mother. SOCIAL HISTORY:  reports that he has never smoked. He has never used smokeless tobacco. He reports current alcohol use. He reports that he does not use drugs.    Review of  Systems:  Gen: + fever, HEENT: Denies blurred vision, double vision, ear pain, eye pain, hearing loss, nose bleeds, sore throat +HA with cough Cardiac:  + dizziness,  Resp+ cough, -sputum production, +shortness of breath,-wheezing, -hemoptysis,  Gi: Denies swallowing difficulty, stomach pain, nausea or vomiting, diarrhea, constipation, bowel incontinence Gu:  Denies bladder incontinence, burning urine Ext:   Denies Joint pain, stiffness or swelling Skin: Denies  skin rash, easy bruising or bleeding or hives Endoc:  Denies polyuria, polydipsia , polyphagia or weight change Psych:   Denies depression, insomnia or hallucinations  Other:  All other systems negative     VITAL SIGNS: Temp:  [98.1 F (36.7 C)-103 F (39.4 C)] 102.3 F (39.1 C) (02/02 1516) Pulse Rate:  [57-118] 73 (02/02 1630) Resp:  [17-27] 20 (02/02 1430) BP: (104-147)/(53-85) 119/72 (02/02 1530) SpO2:  [87 %-97 %] 96 % (02/02 1630) FiO2 (%):  [60 %] 60 % (02/02 1503)     SpO2: 96 % O2 Flow Rate (L/min): 45 L/min FiO2 (%): 60 %   PHYSICAL EXAMINATION:  GENERAL: ill appearing, +resp distress HEAD: Normocephalic, atraumatic.  EYES: Pupils equal, round, reactive to light.  No scleral icterus.  MOUTH: Moist mucosal membrane. NECK: Supple. No thyromegaly. No nodules. No JVD.  PULMONARY: +rhonchi,  CARDIOVASCULAR: S1 and S2. Regular rate and rhythm. No murmurs, rubs, or gallops.  GASTROINTESTINAL: Soft, nontender, -distended. Positive bowel sounds.  MUSCULOSKELETAL: No swelling, clubbing, or edema.  NEUROLOGIC: alert and awake SKIN:intact,warm,dry     MEDICATIONS: I have reviewed all medications and confirmed regimen as documented    CULTURE RESULTS   Recent Results (from the past 240 hour(s))  Novel Coronavirus, NAA ([facility])     Status: None   Collection Time: 12/31/20  2:35 PM   Specimen: Nasopharyngeal(NP) swabs in vial transport medium   Nasopharynge  Result Value Ref Range Status    SARS-CoV-2, NAA Not Detected Not Detected Final    Comment: This nucleic acid amplification test was developed and its performance characteristics determined by [employer]. Nucleic acid amplification tests include RT-PCR and TMA. This test has not been FDA cleared or approved. This test has been authorized by FDA under an Emergency Use Authorization (EUA). This test is only authorized for the duration of time the declaration that circumstances exist justifying the authorization of the emergency use of in vitro diagnostic tests for detection of SARS-CoV-2 virus and/or diagnosis of COVID-19 infection under section 564(b)(1) of the Act, 21 U.S.C. 867JQG-9(E) (1), unless the authorization is terminated or revoked sooner. When diagnostic testing is negative, the possibility of a false negative result should be considered in the context of a patient's recent exposures and the presence of clinical signs and symptoms consistent with COVID-19. An individual without symptoms of COVID-19 and who is not shedding SARS-CoV-2 virus wo uld expect to have a negative (not detected) result in this assay.  SARS-COV-2, NAA 2 DAY TAT     Status: None   Collection Time: 12/31/20  2:35 PM   Nasopharynge  Result Value Ref Range Status   SARS-CoV-2, NAA 2 DAY TAT Performed  Final  Culture, blood (single) w Reflex to ID Panel     Status: None (Preliminary result)   Collection Time: 01/04/21  1:18 PM   Specimen: Blood  Result Value Ref Range Status   MICRO NUMBER: [id number]  Preliminary   SPECIMEN QUALITY: Adequate  Preliminary   Source NOT GIVEN  Preliminary   STATUS: PRELIMINARY  Preliminary   Result:   Preliminary    No growth to date. Culture is continuously monitored for a total of 120 hours incubation. A change in status will result in a phone report followed by an updated printed culture report.   COMMENT: Aerobic and anaerobic bottle received.  Preliminary  Culture, blood (single) w Reflex  to ID Panel     Status: None (Preliminary result)   Collection Time: 01/04/21  1:18 PM   Specimen: Blood  Result Value Ref Range Status   MICRO NUMBER: [id number]  Preliminary   SPECIMEN QUALITY: Adequate  Preliminary   Source BLOOD  Preliminary   STATUS: PRELIMINARY  Preliminary   Result:   Preliminary    No growth to date. Culture is continuously monitored for a total of 120 hours incubation. A change in status will result in a phone report followed by an updated printed culture report.   COMMENT: Aerobic and anaerobic bottle received.  Preliminary  SARS Coronavirus 2 by RT PCR (hospital order, performed in [facility] hospital lab) Nasopharyngeal Nasopharyngeal Swab     Status: None   Collection Time: 01/05/21  2:32 PM   Specimen: Nasopharyngeal Swab  Result Value Ref Range Status   SARS Coronavirus 2 NEGATIVE NEGATIVE Final    Comment: (NOTE) SARS-CoV-2 target nucleic acids are NOT DETECTED.  The SARS-CoV-2 RNA is generally detectable in upper and lower respiratory specimens during the acute phase of infection. The lowest concentration of SARS-CoV-2 viral copies this assay can detect is 250 copies / mL. A negative result does not preclude SARS-CoV-2 infection and should not be used as the sole basis for treatment or other patient management decisions.  A negative result may occur with improper specimen collection / handling, submission of specimen other than nasopharyngeal swab, presence of viral mutation(s) within the areas targeted by this assay, and inadequate number of viral copies (<250 copies / mL). A negative result must be combined with clinical observations, patient history, and epidemiological information.  Fact Sheet for Patients:   StrictlyIdeas.no  Fact Sheet for Healthcare Providers: BankingDealers.co.za  This test is not yet approved or  cleared by the [place] FDA and has been authorized for detection and/or  diagnosis of SARS-CoV-2 by FDA under an Emergency Use Authorization (EUA).  This EUA will remain in effect (meaning this test can be used) for the duration of the COVID-19 declaration under Section 564(b)(1) of the Act, 21 U.S.C. section 360bbb-3(b)(1), unless the authorization is terminated or revoked sooner.  Performed at [facility], [place], [place] [address]   Respiratory (~20 pathogens) panel by PCR     Status: None   Collection Time: 01/05/21  5:14 PM   Specimen: Flu Kit Nasopharyngeal Swab; Respiratory  Result Value Ref Range Status   Adenovirus NOT DETECTED NOT DETECTED Final   Coronavirus 229E NOT DETECTED NOT DETECTED Final    Comment: (NOTE) The Coronavirus  on the Respiratory Panel, DOES NOT test for the novel  Coronavirus (2019 nCoV)    Coronavirus HKU1 NOT DETECTED NOT DETECTED Final   Coronavirus NL63 NOT DETECTED NOT DETECTED Final   Coronavirus OC43 NOT DETECTED NOT DETECTED Final   Metapneumovirus NOT DETECTED NOT DETECTED Final   Rhinovirus / Enterovirus NOT DETECTED NOT DETECTED Final   Influenza A NOT DETECTED NOT DETECTED Final   Influenza B NOT DETECTED NOT DETECTED Final   Parainfluenza Virus 1 NOT DETECTED NOT DETECTED Final   Parainfluenza Virus 2 NOT DETECTED NOT DETECTED Final   Parainfluenza Virus 3 NOT DETECTED NOT DETECTED Final   Parainfluenza Virus 4 NOT DETECTED NOT DETECTED Final   Respiratory Syncytial Virus NOT DETECTED NOT DETECTED Final   Bordetella pertussis NOT DETECTED NOT DETECTED Final   Bordetella Parapertussis NOT DETECTED NOT DETECTED Final   Chlamydophila pneumoniae NOT DETECTED NOT DETECTED Final   Mycoplasma pneumoniae NOT DETECTED NOT DETECTED Final    Comment: Performed at [facility], [place] [address] [place], [place] [address]  MRSA PCR Screening     Status: None   Collection Time: 01/06/21  4:01 AM   Specimen: Urine, Clean Catch; Nasopharyngeal  Result Value Ref Range Status   MRSA by PCR  NEGATIVE NEGATIVE Final    Comment:        The GeneXpert MRSA Assay (FDA approved for NASAL specimens only), is one component of a comprehensive MRSA colonization surveillance program. It is not intended to diagnose MRSA infection nor to guide or monitor treatment for MRSA infections. Performed at [facility], [address] [place], [place] [address]           IMAGING     The CXR was Independently Reviewed By Me Today 01/06/21 CXR reviewed-RLL opacity     ASSESSMENT AND PLAN SYNOPSIS 74 obese white male with RLL opacity with progressive Hypoxia with acute lung injury etiology of infectious process is unknown with underlying DM and probable OSA   Severe ACUTE Hypoxic resp failure HHF [place] fio2 Start IV steroids Start NEBS Morphine as needed Obtain CT chest Consider Valium as needed  ACUTE KIDNEY INJURY/Renal Failure IVF's -continue Foley Catheter-assess need -Avoid nephrotoxic agents -Follow urine output, BMP -Ensure adequate renal perfusion, optimize oxygenation -Renal dose medications  INFECTIOUS DISEASE -continue antibiotics as prescribed -follow up cultures -follow up ID consultation   ENDO - ICU hypoglycemic\Hyperglycemia protocol -check FSBS per protocol  ELECTROLYTES -follow labs as needed -replace as needed -pharmacy consultation and following   DVT/GI PRX ordered and assessed TRANSFUSIONS AS NEEDED MONITOR FSBS I Assessed the need for Labs I Assessed the need for Foley I Assessed the need for Central Venous Line Family Discussion when available I Assessed the need for Mobilization I made an Assessment of medications to be adjusted accordingly Safety Risk assessment completed  CASE DISCUSSED IN MULTIDISCIPLINARY ROUNDS WITH ICU TEAM    Patient/Family are satisfied with Plan of action and management. All questions answered   Recommend admission to SD-ICU to consult and follow very closely   Corrin Parker, M.D.   [facility]  Medical Director [place] Director [facility] Cardio-Pulmonary Department

## 2021-01-06 NOTE — ED Notes (Signed)
Resumed care from annie rn.  Pt alert.  High oxygen in place   Family at bedside.  Pt reports a headache and states I just feel bad.  Iv fluids infusing.

## 2021-01-06 NOTE — ED Notes (Signed)
meds given for anxiety.  Oxygen in place.  nsr on monitor.  Pt alert.

## 2021-01-06 NOTE — ED Notes (Signed)
Pt has a headache.  Morphine given for pain.  Family with pt.

## 2021-01-06 NOTE — ED Notes (Signed)
meds given.  Pt voided 400cc urine.

## 2021-01-06 NOTE — Progress Notes (Signed)
Date of Admission:  01/05/2021    Wife at bed side Subjective: Pt is having intermittent fever- when he gets a fever he feels really bad, has headache chills and feels weak When no fever he feels much better Dry cough when he talks SOB at even minimal exertion No pain abdomen No diarrhea No joint pain  No rash   Medications:  . citalopram  20 mg Oral Daily  . enoxaparin (LOVENOX) injection  0.5 mg/kg Subcutaneous Q24H  . gabapentin  300 mg Oral QHS  . irbesartan  150 mg Oral Daily   And  . hydrochlorothiazide  12.5 mg Oral Daily  . insulin aspart  0-5 Units Subcutaneous QHS  . insulin aspart  0-9 Units Subcutaneous TID WC  . metFORMIN  500 mg Oral Daily  . potassium chloride  20 mEq Oral BID    Objective: Vital signs in last 24 hours: Temp:  [98.1 F (36.7 C)-103 F (39.4 C)] 99.8 F (37.7 C) (02/02 0924) Pulse Rate:  [69-90] 75 (02/02 0940) Resp:  [17-27] 20 (02/02 0635) BP: (104-147)/(53-80) 140/68 (02/02 0745) SpO2:  [90 %-97 %] 94 % (02/02 0940) Weight:  [96.2 kg] 96.2 kg (02/01 1359)  PHYSICAL EXAM:  General: Alert, cooperative, no distress currently ,  Head: Normocephalic, without obvious abnormality, atraumatic. Eyes: Conjunctivae clear, anicteric sclerae. Pupils are equal ENT Nares normal. No drainage or sinus tenderness. Lips, mucosa, and tongue normal. No Thrush Neck: Supple, symmetrical, no adenopathy, thyroid: non tender no carotid bruit and no JVD. Back: No CVA tenderness. Lungs:b/l air entry- crepts bases Heart: Regular rate and rhythm, no murmur, rub or gallop. Abdomen: Soft, non-tender,not distended. Bowel sounds normal. No masses Extremities: atraumatic, no cyanosis. No edema. No clubbing Skin: No rashes or lesions. Or bruising Lymph: Cervical, supraclavicular normal. Neurologic: Grossly non-focal  Lab Results Recent Labs    01/05/21 1428 01/06/21 0748  WBC 10.4 9.0  HGB 11.5* 10.1*  HCT 33.5* 30.0*  NA 134* 132*  K 3.1* 3.6  CL 99  98  CO2 23 22  BUN 27* 23*  CREATININE 1.48* 1.23   Liver Panel Recent Labs    01/04/21 1306 01/05/21 1428  PROT 6.0 6.6  ALBUMIN 3.4* 3.0*  AST 26 72*  ALT 38 76*  ALKPHOS 64 66  BILITOT 0.6 0.6   Sedimentation Rate No results for input(s): ESRSEDRATE in the last 72 hours. C-Reactive Protein No results for input(s): CRP in the last 72 hours.  Microbiology: 01/04/21 blood culture NG Studies/Results: DG Chest 2 View  Result Date: 01/05/2021 CLINICAL DATA:  Question normal sepsis.  Fever EXAM: CHEST - 2 VIEW COMPARISON:  01/04/2021 FINDINGS: Right lower lobe infiltrate posteriorly unchanged from the prior study. No significant pleural effusion. Mild left lower lobe airspace disease unchanged. Negative for heart failure IMPRESSION: Bibasilar infiltrates right greater than left unchanged. Electronically Signed   By: Franchot Gallo M.D.   On: 01/05/2021 15:14   DG Chest 2 View  Result Date: 01/05/2021 CLINICAL DATA:  Dry cough, fever for 7 8 days EXAM: CHEST - 2 VIEW COMPARISON:  None. FINDINGS: The heart size and mediastinal contours are within normal limits. Heterogeneous airspace opacity of the dependent right lower lobe. The visualized skeletal structures are unremarkable. IMPRESSION: Heterogeneous airspace opacity of the dependent right lower lobe, consistent with infection or aspiration. Recommend follow-up radiographs in 6-8 weeks to ensure complete radiographic resolution and exclude underlying malignancy. Electronically Signed   By: Eddie Candle M.D.   On: 01/05/2021 08:19  Assessment/Plan: Acute respiratory illness with fever and cough and bilateral infiltrates on the chest x-ray.  Patient returned from [place] on 12/26/2020 and fell ill after 70 to 96 hours.  No other sick travel companions.  No other sick contacts Differential diagnosis is pneumonia versus a systemic illness with secondary respiratory manifestation Viral pneumonia versus bacterial pneumonia SARS-CoV-2 is  negative so other respiratory viral less like influenza, human metapneumovirus, RSV, adeno, rhino. Mycoplasma, chlamydia negative.  Strep pneumo antigen in the urine is negative. Legionella urine antigen is pending.  Other systemic illness with respiratory involvement like leptospirosis, Q fever less likely Because he visited [place] MERS COV is in the D.D There is a temperature to heart rate discordance.  Can be seen in Legionella, Salmonella, leptospirosis, dengue  Malaria and dengue are not seen in [place].  Patient currently on ceftriaxone and doxycycline.  We will restart azithromycin for better Legionella coverage.  Discussed the management with the patient and his wife in great detail. We will get pulmonary to see the patient Spoke to health department [facility] with Dr. Redmond Pulling and gives sending nasopharyngeal, oropharyngeal and serum for MERS testing.  They also wanted bronchoalveolar lavage or sputum. Sent the oropharyngeal, nasopharyngeal swabs and serum to the Department of Health for MERS testing  Patient to be kept in airborne and contact isolation  Discussed the management with the care team. Discussed with infection prevention.

## 2021-01-06 NOTE — ED Notes (Signed)
Pt extremely short of breath with exertion. Pt respirations at 40. This RN increased oxygen supply once patient returned to bed. Pt oxygen now 91% on 4L. Pt instructed on deep breathing exercises.

## 2021-01-06 NOTE — ED Notes (Signed)
Pt to ct scan.

## 2021-01-06 NOTE — ED Notes (Signed)
MD Dwyane Dee aware and respiratory paged. Pt being placed on high flow cannula at this time.

## 2021-01-06 NOTE — ED Notes (Signed)
fsbs 217

## 2021-01-06 NOTE — Progress Notes (Signed)
Inpatient Diabetes Program Recommendations  AACE/ADA: New Consensus Statement on Inpatient Glycemic Control (2015)  Target Ranges:  Prepandial:   less than 140 mg/dL      Peak postprandial:   less than 180 mg/dL (1-2 hours)      Critically ill patients:  140 - 180 mg/dL   Results for ZAYAAN, KOZAK (MRN [id number]) as of 01/06/2021 07:43  Ref. Range 01/05/2021 22:59  Glucose-Capillary Latest Ref Range: 70 - 99 mg/dL 185 (H)   Results for RAS, KOLLMAN (MRN [id number]) as of 01/06/2021 07:43  Ref. Range 06/25/2020 12:19 10/13/2020 09:59 01/05/2021 14:26  Hemoglobin A1C Latest Ref Range: 4.8 - 5.6 % 7.1 (A) 7.2 (A) 7.8 (H)  (177 mg/dl)    Admit with: Pneumonia/ Fever  History: DM  Home DM Meds: Metformin 500 mg Daily (stopepd taking about 2-3 weeks ago)  Current Orders: Novolog Sensitive Correction Scale/ SSI (0-9 units) TID AC + HS      Metformin 500 mg Daily    Patient came back from 12/26/20 from [place] (8 days) on a class trip Currently working as a professor of Proofreader at [employer]  PCP: Dr. Danise Mina with [facility]--Seen 10/13/2020 for diabetes visit--Having issues with diarrhea and the Metformin--Pt given trial of Metformin XR 500 mg QPM   Novolog SSi and Metformin both to start this AM   --Will follow patient during hospitalization--  Wyn Quaker RN, MSN, CDE Diabetes Coordinator Inpatient Glycemic Control Team Team Pager: [phone] (8a-5p)

## 2021-01-06 NOTE — ED Notes (Signed)
Message sent to NP [facility] regarding pt fever and possibly increasing medications for fever. See eMAR and new orders.

## 2021-01-06 NOTE — ED Notes (Signed)
fsbs 156

## 2021-01-06 NOTE — ED Notes (Signed)
Pt resting with eyes closed   Family with pt.  

## 2021-01-06 NOTE — ED Notes (Signed)
Pt refusing to use bedside commode due to oxygen requirements. Pt;s respirations labored at this time. Pt refusing and demanding to walk to bathroom. Pt unhooked self from oxygen and monitors to walk to bathroom with oxygen.

## 2021-01-06 NOTE — Progress Notes (Signed)
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  Patient is not sure whether any of other members have the same symptoms.  Patient also reported lightheadedness and dizziness with shortness of breath. He is fully vaccinated. Infectious disease and pulmonology consulted.  Assessment & Plan:   Active Problems:   PNA (pneumonia)   Acute hypoxic respiratory failure secondary to community-acquired pneumonia : Patient presents with fever, cough and bilateral infiltrate on chest x-ray. Procalcitonin  0.73 Differential diagnoses include CAP, atypical viral pneumonia. Covid, influenza and parainfluenza negative,  RSV negative. MERS Covid - pending Legionella mycoplasma antigen pending Continue ceftriaxone and doxycycline for now. Follow blood cultures. Continue supplemental oxygen to keep saturation above 94%. She denies any mosquito bite, animal bite or tick bite. He denies any swimming in [place]. Infectious disease has been consulted and recommends: MERS COV testing, RESP viral PCR with 20 pathogens, SARS COV2 antibody, Blood cultures, and pulmonology consult as patient may need bronchoscopy if blood tests do not yield an answer. -May need CT chest -Infectious disease change azithromycin to doxycycline. MRSA of the nares - negative. Pulmonology consulted,  awaiting recommendation.  Not insulin-dependent diabetes mellitus-resumed home Metformin - Patient stopped taking metformin on his own about 2-3 weeks ago -  Insulin SSI with HS coverage ordered. -Diabetes coordinator and registered dietitian ordered -Insulin SSI  AKI - Improving.  patient has had poor p.o. intake for the last 7 days and has been intermittently taking acetaminophen and ibuprofen for fever control. -LR IVF at 125 cc/h for 12 hours -Avoid nephrotoxic agents -BMP in the a.m.  Pneumonia Continue ceftriaxone and doxycycline  Hypertension-resumed home antihypertensive medications.  Anxiety/depression- Continue Xanax 0.5 mg daily as needed for anxiety, citalopram 20 mg p.o. daily     DVT prophylaxis: Lovenox Code Status:  Full code. Family Communication:Spouse at bed side. Disposition Plan:   Status is: Inpatient  Remains inpatient appropriate because:Inpatient level of care appropriate due to severity of illness   Dispo: The patient is from: Home              Anticipated d/c is to: Home              Anticipated d/c date is: > 3 days              Patient currently is not medically stable to d/c.   Difficult to place patient No   Consultants:   Infectious disease  Pulmonology  Procedures:  None.  Antimicrobials: Anti-infectives (From admission, onward)   Start     Dose/Rate Route Frequency Ordered Stop   01/06/21 2000  azithromycin (ZITHROMAX) 500 mg in sodium chloride 0.9 % 250 mL IVPB  Status:  Discontinued        500 mg 250 mL/hr over 60 Minutes Intravenous Every 24 hours 01/05/21 1843 01/05/21 2216   01/06/21 1800  azithromycin (ZITHROMAX) 500 mg in sodium chloride 0.9 % 250 mL IVPB        500 mg 250 mL/hr over 60 Minutes Intravenous Every 24 hours 01/06/21 0959     01/06/21 0600  cefTRIAXone (ROCEPHIN) 1 g in sodium chloride 0.9 % 100 mL IVPB  Status:  Discontinued        1 g 200 mL/hr over 30 Minutes Intravenous Every 24 hours 01/05/21 1843 01/05/21 2201   01/06/21 0600  cefTRIAXone (ROCEPHIN) 2 g in sodium chloride 0.9 % 100 mL IVPB        2 g 200 mL/hr over 30 Minutes Intravenous Every 24  hours 01/05/21 2201     01/06/21 0600  doxycycline (VIBRAMYCIN) 100 mg in sodium chloride 0.9 % 250 mL IVPB        100 mg 125 mL/hr over 120 Minutes Intravenous Every 12 hours 01/05/21 2216     01/05/21 1715  cefTRIAXone (ROCEPHIN) 1 g in sodium chloride 0.9 % 100 mL IVPB        1 g 200 mL/hr over 30 Minutes Intravenous  Once 01/05/21 1704 01/05/21 1843   01/05/21 1715  azithromycin (ZITHROMAX) 500 mg in sodium chloride 0.9 % 250 mL IVPB  Status:  Discontinued        500 mg 250 mL/hr over 60 Minutes Intravenous  Once 01/05/21 1704 01/05/21 1847      Subjective: Patient was seen and examined at bedside.  Overnight events noted.  Patient reports feeling better. Patient had spiked fever in the morning,  was given ibuprofen.  Patient became short of breath after going to the bathroom and was placed on high flow nasal cannula.  Objective: Vitals:   01/06/21 1445 01/06/21 1500 01/06/21 1503 01/06/21 1516  BP:  132/66    Pulse: 100 (!) 106    Resp:      Temp:    (!) 102.3 F (39.1 C)  TempSrc:    Oral  SpO2: 92% 90% 93%   Weight:      Height:        Intake/Output Summary (Last 24 hours) at 01/06/2021 1530 Last data filed at 01/06/2021 0816 Gross per 24 hour  Intake --  Output 275 ml  Net -275 ml   Filed Weights   01/05/21 1359  Weight: 96.2 kg    Examination:  General exam: Appears calm and comfortable, not in any distress. Respiratory system: Clear to auscultation. Respiratory effort normal. Cardiovascular system: S1 & S2 heard, RRR. No JVD, murmurs, rubs, gallops or clicks. No pedal edema. Gastrointestinal system: Abdomen is nondistended, soft and nontender. No organomegaly or masses felt. Normal bowel sounds heard. Central nervous system: Alert and oriented. No focal neurological deficits. Extremities: Symmetric 5 x 5 power. Skin: No rashes, lesions or ulcers Psychiatry: Judgement and insight appear normal. Mood & affect appropriate.     Data Reviewed: I have  personally reviewed following labs and imaging studies  CBC: Recent Labs  Lab 01/04/21 1306 01/05/21 1428 01/06/21 0748  WBC 9.4 10.4 9.0  NEUTROABS 7,802* 8.3*  --   HGB 12.6* 11.5* 10.1*  HCT 38.1* 33.5* 30.0*  MCV 84.1 82.7 82.9  PLT 212 208 272   Basic Metabolic Panel: Recent Labs  Lab 01/04/21 1306 01/05/21 1428 01/06/21 0748  NA 135 134* 132*  K 3.5 3.1* 3.6  CL 99 99 98  CO2 24 23 22   GLUCOSE 237* 235* 165*  BUN 24* 27* 23*  CREATININE 1.52* 1.48* 1.23  CALCIUM 8.3* 8.0* 7.8*  MG  --   --  2.1   GFR: Estimated Creatinine Clearance: 70.9 mL/min (by C-G formula based on SCr of 1.23 mg/dL). Liver Function Tests: Recent Labs  Lab 01/04/21 1306 01/05/21 1428  AST 26 72*  ALT 38 76*  ALKPHOS 64 66  BILITOT 0.6 0.6  PROT 6.0 6.6  ALBUMIN 3.4* 3.0*   No results for input(s): LIPASE, AMYLASE in the last 168 hours. No results for input(s): AMMONIA in the last 168 hours. Coagulation Profile: No results for input(s): INR, PROTIME in the last 168 hours. Cardiac Enzymes: No results for input(s): CKTOTAL, CKMB, CKMBINDEX, TROPONINI in the last 168 hours. BNP (last 3 results) No results for input(s): PROBNP in the last 8760 hours. HbA1C: Recent Labs    01/05/21 1426  HGBA1C 7.8*   CBG: Recent Labs  Lab 01/05/21 2259 01/06/21 0744  GLUCAP 185* 139*   Lipid Profile: No results for input(s): CHOL, HDL, LDLCALC, TRIG, CHOLHDL, LDLDIRECT in the last 72 hours. Thyroid Function Tests: No results for input(s): TSH, T4TOTAL, FREET4, T3FREE, THYROIDAB in the last 72 hours. Anemia Panel: No results for input(s): VITAMINB12, FOLATE, FERRITIN, TIBC, IRON, RETICCTPCT in the last 72 hours. Sepsis Labs: Recent Labs  Lab 01/05/21 1426 01/05/21 1428 01/06/21 0748  PROCALCITON  --  <0.10 0.73  LATICACIDVEN 1.3  --   --     Recent Results (from the past 240 hour(s))  Novel Coronavirus, NAA ([facility])     Status: None   Collection Time: 12/31/20  2:35 PM    Specimen: Nasopharyngeal(NP) swabs in vial transport medium   Nasopharynge  Result Value Ref Range Status   SARS-CoV-2, NAA Not Detected Not Detected Final    Comment: This nucleic acid amplification test was developed and its performance characteristics determined by [employer]. Nucleic acid amplification tests include RT-PCR and TMA. This test has not been FDA cleared or approved. This test has been authorized by FDA under an Emergency Use Authorization (EUA). This test is only authorized for the duration of time the declaration that circumstances exist justifying the authorization of the emergency use of in vitro diagnostic tests for detection of SARS-CoV-2 virus and/or diagnosis of COVID-19 infection under section 564(b)(1) of the Act, 21 U.S.C. 865HQI-6(N) (1), unless the authorization is terminated or revoked sooner. When diagnostic testing is negative, the possibility of a false negative result should be considered in the context of a patient's recent exposures and the presence of clinical signs and symptoms consistent with COVID-19. An individual without symptoms of COVID-19 and who is not shedding SARS-CoV-2 virus wo uld expect to have a negative (not detected) result in this assay.   SARS-COV-2, NAA 2 DAY TAT     Status: None   Collection Time: 12/31/20  2:35 PM   Nasopharynge  Result Value Ref Range Status   SARS-CoV-2, NAA 2 DAY TAT Performed  Final  Culture, blood (single) w Reflex to ID Panel     Status: None (Preliminary result)   Collection Time: 01/04/21  1:18 PM   Specimen: Blood  Result Value Ref Range Status   MICRO NUMBER: [id number]  Preliminary   SPECIMEN QUALITY: Adequate  Preliminary   Source NOT GIVEN  Preliminary   STATUS: PRELIMINARY  Preliminary   Result:   Preliminary    No growth to date. Culture is continuously monitored for a total of 120 hours incubation. A change in status will result in a phone report followed by an updated printed  culture report.   COMMENT: Aerobic and anaerobic bottle received.  Preliminary  Culture, blood (single) w Reflex to ID Panel     Status: None (Preliminary result)   Collection Time: 01/04/21  1:18 PM   Specimen: Blood  Result Value Ref Range Status   MICRO NUMBER: [id number]  Preliminary   SPECIMEN QUALITY: Adequate  Preliminary   Source BLOOD  Preliminary   STATUS: PRELIMINARY  Preliminary   Result:   Preliminary    No growth to date. Culture is continuously monitored for a total of 120 hours incubation. A change in status will result in a phone report followed by an updated printed culture report.   COMMENT: Aerobic and anaerobic bottle received.  Preliminary  SARS Coronavirus 2 by RT PCR (hospital order, performed in [facility] hospital lab) Nasopharyngeal Nasopharyngeal Swab     Status: None   Collection Time: 01/05/21  2:32 PM   Specimen: Nasopharyngeal Swab  Result Value Ref Range Status   SARS Coronavirus 2 NEGATIVE NEGATIVE Final    Comment: (NOTE) SARS-CoV-2 target nucleic acids are NOT DETECTED.  The SARS-CoV-2 RNA is generally detectable in upper and lower respiratory specimens during the acute phase of infection. The lowest concentration of SARS-CoV-2 viral copies this assay can detect is 250 copies / mL. A negative result does not preclude SARS-CoV-2 infection and should not be used as the sole basis for treatment or other patient management decisions.  A negative result may occur with improper specimen collection / handling, submission of specimen other than nasopharyngeal swab, presence of viral mutation(s) within the areas targeted by this assay, and inadequate number of viral copies (<250 copies / mL). A negative result must be combined with clinical observations, patient history, and epidemiological information.  Fact Sheet for Patients:   StrictlyIdeas.no  Fact Sheet for Healthcare  Providers: BankingDealers.co.za  This test is not yet approved or  cleared by the [place] FDA and has been authorized for detection and/or diagnosis of SARS-CoV-2 by FDA under an Emergency Use Authorization (EUA).  This EUA will remain in effect (meaning this test can be used) for the duration of the COVID-19 declaration under Section 564(b)(1) of the Act, 21 U.S.C. section 360bbb-3(b)(1), unless the authorization is terminated or revoked sooner.  Performed at [facility], [place], [place] [address]   Respiratory (~20 pathogens) panel by PCR     Status: None   Collection Time: 01/05/21  5:14 PM   Specimen: Flu Kit Nasopharyngeal Swab; Respiratory  Result Value Ref Range Status   Adenovirus NOT DETECTED NOT DETECTED Final   Coronavirus 229E NOT DETECTED NOT DETECTED Final    Comment: (NOTE) The Coronavirus on the Respiratory Panel, DOES NOT test for the novel  Coronavirus (2019 nCoV)    Coronavirus HKU1 NOT DETECTED NOT DETECTED Final   Coronavirus NL63 NOT DETECTED NOT DETECTED Final   Coronavirus OC43 NOT DETECTED NOT DETECTED Final   Metapneumovirus NOT DETECTED NOT DETECTED Final   Rhinovirus / Enterovirus NOT DETECTED NOT DETECTED Final   Influenza A NOT DETECTED NOT DETECTED Final   Influenza B NOT DETECTED NOT DETECTED Final   Parainfluenza Virus 1 NOT DETECTED NOT DETECTED Final   Parainfluenza Virus 2 NOT DETECTED NOT DETECTED Final   Parainfluenza Virus 3 NOT DETECTED NOT DETECTED Final   Parainfluenza Virus 4 NOT DETECTED NOT DETECTED Final   Respiratory Syncytial Virus NOT DETECTED NOT DETECTED Final   Bordetella pertussis NOT DETECTED NOT DETECTED Final   Bordetella Parapertussis NOT DETECTED NOT DETECTED Final   Chlamydophila pneumoniae NOT DETECTED NOT DETECTED Final   Mycoplasma pneumoniae NOT DETECTED NOT DETECTED Final    Comment: Performed at [facility], [place]. [address], [place], [place] [address]  MRSA PCR Screening     Status: None   Collection Time: 01/06/21  4:01 AM   Specimen: Urine, Clean Catch; Nasopharyngeal  Result Value Ref Range Status   MRSA by PCR NEGATIVE NEGATIVE Final    Comment:        The GeneXpert MRSA Assay (FDA approved for NASAL specimens only), is one component of a comprehensive MRSA colonization surveillance program. It is not intended to diagnose MRSA infection nor to guide or monitor treatment for MRSA infections. Performed at [facility], [address], [place], [place] [address]     Radiology Studies: DG Chest 2 View  Result Date: 01/05/2021 CLINICAL DATA:  Question normal sepsis.  Fever EXAM: CHEST - 2 VIEW COMPARISON:  01/04/2021 FINDINGS: Right lower lobe infiltrate posteriorly unchanged from the prior study. No significant pleural effusion. Mild left lower lobe airspace disease unchanged. Negative for heart failure IMPRESSION: Bibasilar infiltrates right greater than left unchanged. Electronically Signed   By: Franchot Gallo M.D.   On: 01/05/2021 15:14   Scheduled Meds: . citalopram  20 mg Oral Daily  . enoxaparin (LOVENOX) injection  0.5 mg/kg Subcutaneous Q24H  . gabapentin  300 mg Oral QHS  . irbesartan  150 mg Oral Daily   And  . hydrochlorothiazide  12.5 mg Oral Daily  . insulin aspart  0-5 Units Subcutaneous QHS  . insulin aspart  0-9 Units Subcutaneous TID WC  . metFORMIN  500 mg Oral Daily  . potassium chloride  20 mEq Oral BID   Continuous Infusions: . sodium chloride 100 mL/hr at 01/06/21 1414  . azithromycin    . cefTRIAXone (ROCEPHIN)  IV Stopped (01/06/21 0739)  . doxycycline (VIBRAMYCIN) IV Stopped (01/06/21 1016)     LOS: 0 days    Time spent: 35 mins.    Shawna Clamp, MD [facility]   If 7PM-7AM, please contact night-coverage

## 2021-01-07 ENCOUNTER — Inpatient Hospital Stay (HOSPITAL_COMMUNITY)
Admit: 2021-01-07 | Discharge: 2021-01-07 | Disposition: A | Discharge status: Home / Self Care | Payer: BC Managed Care – PPO | Attending: Critical Care Medicine | Admitting: Critical Care Medicine

## 2021-01-07 DIAGNOSIS — J189 Pneumonia, unspecified organism: Secondary | ICD-10-CM | POA: Diagnosis not present

## 2021-01-07 DIAGNOSIS — J9600 Acute respiratory failure, unspecified whether with hypoxia or hypercapnia: Secondary | ICD-10-CM | POA: Diagnosis not present

## 2021-01-07 DIAGNOSIS — R0603 Acute respiratory distress: Secondary | ICD-10-CM

## 2021-01-07 DIAGNOSIS — Z794 Long term (current) use of insulin: Secondary | ICD-10-CM

## 2021-01-07 DIAGNOSIS — J9601 Acute respiratory failure with hypoxia: Secondary | ICD-10-CM | POA: Diagnosis not present

## 2021-01-07 DIAGNOSIS — E1122 Type 2 diabetes mellitus with diabetic chronic kidney disease: Secondary | ICD-10-CM | POA: Diagnosis not present

## 2021-01-07 DIAGNOSIS — A481 Legionnaires' disease: Secondary | ICD-10-CM | POA: Diagnosis not present

## 2021-01-07 LAB — COMPREHENSIVE METABOLIC PANEL
ALT: 57 U/L — ABNORMAL HIGH (ref 0–44)
AST: 44 U/L — ABNORMAL HIGH (ref 15–41)
Albumin: 2.5 g/dL — ABNORMAL LOW (ref 3.5–5.0)
Alkaline Phosphatase: 62 U/L (ref 38–126)
Anion gap: 13 (ref 5–15)
BUN: 25 mg/dL — ABNORMAL HIGH (ref 6–20)
CO2: 20 mmol/L — ABNORMAL LOW (ref 22–32)
Calcium: 8 mg/dL — ABNORMAL LOW (ref 8.9–10.3)
Chloride: 103 mmol/L (ref 98–111)
Creatinine, Ser: 1.2 mg/dL (ref 0.61–1.24)
GFR, Estimated: >60 mL/min (ref >60)
Glucose, Bld: 249 mg/dL — ABNORMAL HIGH (ref 70–99)
Potassium: 4.1 mmol/L (ref 3.5–5.1)
Sodium: 136 mmol/L (ref 135–145)
Total Bilirubin: 0.7 mg/dL (ref 0.3–1.2)
Total Protein: 6 g/dL — ABNORMAL LOW (ref 6.5–8.1)

## 2021-01-07 LAB — ECHOCARDIOGRAM COMPLETE
AR max vel: 2.52 cm2
AV Area VTI: 2.76 cm2
AV Area mean vel: 2.52 cm2
AV Mean grad: 3 mmHg
AV Peak grad: 4.4 mmHg
Ao pk vel: 1.05 m/s
Area-P 1/2: 5.62 cm2
Height: 67.25 in
S' Lateral: 3.32 cm
Weight: 3392 oz

## 2021-01-07 LAB — CBC
HCT: 31.5 % — ABNORMAL LOW (ref 39.0–52.0)
Hemoglobin: 10.2 g/dL — ABNORMAL LOW (ref 13.0–17.0)
MCH: 27.5 pg (ref 26.0–34.0)
MCHC: 32.4 g/dL (ref 30.0–36.0)
MCV: 84.9 fL (ref 80.0–100.0)
Platelets: 258 10*3/uL (ref 150–400)
RBC: 3.71 MIL/uL — ABNORMAL LOW (ref 4.22–5.81)
RDW: 13.5 % (ref 11.5–15.5)
WBC: 10.5 10*3/uL (ref 4.0–10.5)
nRBC: 0 % (ref 0.0–0.2)

## 2021-01-07 LAB — Q FEVER ANTIBODIES, IGG
Q Fever Phase I: NEGATIVE
Q Fever Phase II: NEGATIVE

## 2021-01-07 LAB — CBG MONITORING, ED
Glucose-Capillary: 246 mg/dL — ABNORMAL HIGH (ref 70–99)
Glucose-Capillary: 288 mg/dL — ABNORMAL HIGH (ref 70–99)
Glucose-Capillary: 303 mg/dL — ABNORMAL HIGH (ref 70–99)
Glucose-Capillary: 269 mg/dL — ABNORMAL HIGH (ref 70–99)

## 2021-01-07 LAB — PROCALCITONIN: Procalcitonin: 0.71 ng/mL

## 2021-01-07 LAB — MISC LABCORP TEST (SEND OUT): Labcorp test code: 86064

## 2021-01-07 LAB — PHOSPHORUS: Phosphorus: 3.8 mg/dL (ref 2.5–4.6)

## 2021-01-07 LAB — MAGNESIUM: Magnesium: 2.6 mg/dL — ABNORMAL HIGH (ref 1.7–2.4)

## 2021-01-07 MED ORDER — ALBUTEROL SULFATE HFA 108 (90 BASE) MCG/ACT IN AERS
2.0000 | INHALATION_SPRAY | Freq: Four times a day (QID) | RESPIRATORY_TRACT | Status: DC | PRN
  Filled 2021-01-07 (×2): qty 6.7

## 2021-01-07 NOTE — Progress Notes (Signed)
NAME:  Jermaine Harris, MRN:  [id number], DOB:  06/21/1961, LOS: 1 ADMISSION DATE:  01/05/2021, CONSULTATION DATE:  01/06/2021 REFERRING MD:  Dr. Dwyane Dee, CHIEF COMPLAINT:  Cough and Fevers  Brief History:  60 y.o. Male with recent international travel to [place] who presented on 01/05/21 with flulike symptoms (myalgia, fever, chills, and cough).  Pt now with Acute Hypoxic Respiratory Failure in the setting of Multifocal Pneumonia. ID is following.  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   He left for [place] on 12/17/20 and reaced there eon 12/18/20 , stayed in [place] for a week, mostly had meeting in the hotel and visited certain places like [employer] .   He spent a day in [place] and returned to the [place] on 12/26/20.  - went to work on 1/24, 1/25.   1/24-1/25 cough 1/26 high fevers 1/27 COVID rapid AG test NEG Started ABX 1/31 . He had a televist with his PCP Dr.Gutierrez on 1/31 and blood culture sent, CXR showed rt lower lobe opacity  2/1 ID and PULMONARY CONSULTED   ER COURSE CXR showed RLL opacity COVID test neg Started on ceftriaxone and azithromycin  no sick contacts Triple vaccinated for covid Placed on High flow [place] HHF 45L fio2 at 75%-feels better with HHF  Past Medical History:  Actinic Keratosis Claustrophobia Generalized Anxiety Disorder Hypertension Irritable Bowel Syndrome Lumbar Herniated Disc (06/2015) Seasonal Allergies  Significant Hospital Events:  2/1: Presented to ED, Hospitalist to admit to Stepdown 2/1: ID & Pulmonary Consulted  Consults:  Hospitalist (primary service) ID Pulmonary  Procedures:  N/A  Significant Diagnostic Tests:  2/1: Chest x-ray>>Right lower lobe infiltrate posteriorly  unchanged from the prior study. No significant pleural effusion. Mild left lower lobe airspace disease unchanged. Negative for heart failure 2/2: CT chest without contrast>>1. Bilateral lower lobe airspace consolidation with air bronchograms consistent with multifocal pneumonia. 2. Hepatic steatosis. 3. Aortic atherosclerosis.  Micro Data:  1/27: SARS-CoV-2 Ag>> negative 1/27: Influenza A&B POC>> negative 1/27: Novel coronavirus NAA>> not detected 1/31: Blood culture x2>> no growth to date 2/1: SARS-CoV-2 PCR>> negative 2/1: Respiratory viral panel>> negative 2/1: HIV screen>> nonreactive 2/2: MRSA PCR>> negative 2/2: Strep pneumo urinary antigen>> negative 2/2: Legionella urinary antigen>>  Antimicrobials:  Azithromycin 2/1>> Ceftriaxone 2/1>> Doxycycline 2/2>>  Interim History / Subjective:  No acute events reported overnight Currently on 44 L , 60% FiO2 via HHFNC with O2 saturations 95% Afebrile currently, hemodynamically stable Reports shortness of breath, which has improved Reports dyspnea on exertion and wheezing on exertion, intermittent nonproductive cough Denies chest pain, abdominal pain, nausea, vomiting, diarrhea, fever, chills, headache, pain   Objective   Blood pressure (!) 147/82, pulse 60, temperature 98.7 F (37.1 C), temperature source Oral, resp. rate (!) 25, height 5' 7.25" (1.708 m), weight 96.2 kg, SpO2 94 %.    FiO2 (%):  [60 %-65 %] 65 %   Intake/Output Summary (Last 24 hours) at 01/07/2021 0800 Last data filed at 01/07/2021 0731 Gross per 24 hour  Intake 600 ml  Output 425 ml  Net 175 ml   Filed Weights   01/05/21 1359  Weight: 96.2 kg    Examination: General: Acutely ill-appearing male, sitting in bed, on heated high flow nasal cannula, no  acute distress HENT: Atraumatic, normocephalic, neck supple, no JVD Lungs: Clear to auscultation bilaterally, diminished in the bases bilaterally, even, mild tachypnea Cardiovascular: Regular rate and  rhythm, S1-S2, no murmurs, rubs, gallops, 1+ distal pulses Abdomen: Obese, soft, nontender, nondistended, no guarding or rebound tenderness, bowel sounds positive x4 Extremities: Normal bulk and tone, no deformities, no edema Neuro: Awake, alert and oriented x4, follows commands, no focal deficits, speech clear Skin: Warm and dry.  No obvious rashes, lesions, ulcerations  Resolved Hospital Problem list   N/A  Assessment & Plan:   Acute Hypoxic Respiratory Failure in the setting of Multifocal Pneumonia -Supplemental O2 as needed to maintain O2 saturations greater than 92% -Currently tolerating HHFNC -Follow intermittent chest x-ray and ABG as needed -CT Chest on 01/06/21 with bilateral LL airspace consolidations consistent with Multifocal Pneumonia -Bronchodilators -Continue Inhaled and IV steroids -Incentive spirometry and flutter valve -Continue Azithromycin, Ceftriaxone, and Doxycyline as per ID   Multifocal Pneumonia -Monitor fever curve -Trend WBCs and procalcitonin -Follow cultures as above (SARS-CoV-2 PCR is negative, Respiratory Viral Panel is negative, Strep Pneumo urinary antigen negative) -MERS testing pending, Legionella urinary antigen pending  -ID following, appreciate input -Antibiotics as per ID (currently on azithromycin, ceftriaxone, doxycycline) -Maintain Airborne and Contact isolation -Dr. Belia HemanKasa discussed with pt, no plan for Bronchoscopy at this time   AKI -Monitor I&O's / urinary output -Follow BMP -Ensure adequate renal perfusion -Avoid nephrotoxic agents as able -Replace electrolytes as indicated -IV Fluids   Diabetes Mellitus -CBG's -SSI -Follow ICU Hypo/hyperglycemia protocol   Best practice (evaluated daily)  Diet: Heart Healthy/Carb Modified Pain/Anxiety/Delirium protocol (if indicated): Prn Morphine, Xanax VAP protocol (if indicated): N/A DVT prophylaxis: Lovenox GI prophylaxis: N/A Glucose control: SSI Mobility: As  tolerated Disposition: Stepdown  Goals of Care:  Last date of multidisciplinary goals of care discussion: N/A Family and staff present: Updated pt and wife at bedside 01/07/21 Summary of discussion: Discussed negative culture results, uptrend of Procalcitonin yesterday, continue with current Antibiotics Follow up goals of care discussion due: 01/08/2021 Code Status: Full Code  Labs   CBC: Recent Labs  Lab 01/04/21 1306 01/05/21 1428 01/06/21 0748 01/07/21 0447  WBC 9.4 10.4 9.0 10.5  NEUTROABS 7,802* 8.3*  --   --   HGB 12.6* 11.5* 10.1* 10.2*  HCT 38.1* 33.5* 30.0* 31.5*  MCV 84.1 82.7 82.9 84.9  PLT 212 208 215 258    Basic Metabolic Panel: Recent Labs  Lab 01/04/21 1306 01/05/21 1428 01/06/21 0748 01/07/21 0447  NA 135 134* 132* 136  K 3.5 3.1* 3.6 4.1  CL 99 99 98 103  CO2 24 23 22  20*  GLUCOSE 237* 235* 165* 249*  BUN 24* 27* 23* 25*  CREATININE 1.52* 1.48* 1.23 1.20  CALCIUM 8.3* 8.0* 7.8* 8.0*  MG  --   --  2.1 2.6*  PHOS  --   --   --  3.8   GFR: Estimated Creatinine Clearance: 72.7 mL/min (by C-G formula based on SCr of 1.2 mg/dL). Recent Labs  Lab 01/04/21 1306 01/05/21 1426 01/05/21 1428 01/06/21 0748 01/07/21 0447  PROCALCITON  --   --  <0.10 0.73  --   WBC 9.4  --  10.4 9.0 10.5  LATICACIDVEN  --  1.3  --   --   --     Liver Function Tests: Recent Labs  Lab 01/04/21 1306 01/05/21 1428 01/07/21 0447  AST 26 72* 44*  ALT 38 76* 57*  ALKPHOS 64 66 62  BILITOT 0.6 0.6  0.7  PROT 6.0 6.6 6.0*  ALBUMIN 3.4* 3.0* 2.5*   No results for input(s): LIPASE, AMYLASE in the last 168 hours. No results for input(s): AMMONIA in the last 168 hours.  ABG No results found for: PHART, PCO2ART, PO2ART, HCO3, TCO2, ACIDBASEDEF, O2SAT   Coagulation Profile: No results for input(s): INR, PROTIME in the last 168 hours.  Cardiac Enzymes: No results for input(s): CKTOTAL, CKMB, CKMBINDEX, TROPONINI in the last 168 hours.  HbA1C: Hemoglobin A1C   Date/Time Value Ref Range Status  10/13/2020 09:59 AM 7.2 (A) 4.0 - 5.6 % Final  06/25/2020 12:19 PM 7.1 (A) 4.0 - 5.6 % Final   Hgb A1c MFr Bld  Date/Time Value Ref Range Status  01/05/2021 02:26 PM 7.8 (H) 4.8 - 5.6 % Final    Comment:    (NOTE) Pre diabetes:          5.7%-6.4%  Diabetes:              >6.4%  Glycemic control for   <7.0% adults with diabetes   12/28/2018 08:23 AM 6.6 (H) 4.6 - 6.5 % Final    Comment:    Glycemic Control Guidelines for People with Diabetes:Non Diabetic:  <6%Goal of Therapy: <7%Additional Action Suggested:  >8%     CBG: Recent Labs  Lab 01/05/21 2259 01/06/21 0744 01/06/21 1716 01/06/21 2130 01/07/21 0730  GLUCAP 185* 139* 156* 217* 246*    Review of Systems:   Positives in BOLD: Gen: Denies fever, chills, weight change, +fatigue, night sweats HEENT: Denies blurred vision, double vision, hearing loss, tinnitus, sinus congestion, rhinorrhea, sore throat, neck stiffness, dysphagia PULM: Denies +shortness of breath, +cough, sputum production, hemoptysis, wheezing CV: Denies chest pain, edema, orthopnea, paroxysmal nocturnal dyspnea, palpitations GI: Denies abdominal pain, nausea, vomiting, diarrhea, hematochezia, melena, constipation, change in bowel habits GU: Denies dysuria, hematuria, polyuria, oliguria, urethral discharge Endocrine: Denies hot or cold intolerance, polyuria, polyphagia or appetite change Derm: Denies rash, dry skin, scaling or peeling skin change Heme: Denies easy bruising, bleeding, bleeding gums Neuro: Denies headache, numbness, weakness, slurred speech, loss of memory or consciousness   Past Medical History:  He,  has a past medical history of Actinic keratosis, Claustrophobia, GAD (generalized anxiety disorder), HTN (hypertension), Irritable bowel syndrome, Lumbar herniated disc (06/2015), and Seasonal allergies.   Surgical History:   Past Surgical History:  Procedure Laterality Date  . COLON SURGERY    .  COLONOSCOPY  2012   mild diverticulosis, rec rpt 5 yrs 2/2 fmhx polyps  . COLONOSCOPY WITH PROPOFOL N/A 05/29/2019   TA, diverticulosis, rpt 5 yrs (Bonna Gains, Lennette Bihari, MD)  . MICRODISCECTOMY LUMBAR  07/2015   L4/5/S1 Trenton Gammon)  . TONSILLECTOMY       Social History:   reports that he has never smoked. He has never used smokeless tobacco. He reports current alcohol use. He reports that he does not use drugs.   Family History:  His family history includes Colon polyps in his sister; Depression in his father and mother; Heart disease in his father; Sudden death (age of onset: 68) in his maternal grandfather; Transient ischemic attack (age of onset: 24) in his mother.   Allergies Allergies  Allergen Reactions  . Ace Inhibitors Cough     Home Medications  Prior to Admission medications   Medication Sig Start Date End Date Taking? Authorizing Provider  ALPRAZolam (NIRAVAM) 0.5 MG dissolvable tablet Take 1 tablet (0.5 mg total) by mouth daily as needed for anxiety (plane rides). 02/22/18  Ria Bush, MD  amoxicillin-clavulanate (AUGMENTIN) 875-125 MG tablet Take 1 tablet by mouth 2 (two) times daily. 01/02/21   [provider]  citalopram (CELEXA) 20 MG tablet Take 1 tablet (20 mg total) by mouth daily. 06/25/20   Ria Bush, MD  gabapentin (NEURONTIN) 300 MG capsule TAKE 1 CAPSULE BY MOUTH EVERYDAY AT BEDTIME 01/03/20   Ria Bush, MD  ibuprofen (ADVIL,MOTRIN) 200 MG tablet Take 200 mg by mouth as needed.    [provider]  metFORMIN (GLUCOPHAGE XR) 500 MG 24 hr tablet Take 1 tablet (500 mg total) by mouth daily. 10/13/20   Ria Bush, MD  valsartan-hydrochlorothiazide (DIOVAN-HCT) 160-12.5 MG tablet TAKE 1 TABLET BY MOUTH EVERY DAY 12/30/20   Ria Bush, MD     Critical care time: 44 minutes    Darel Hong, [facility] [place] [facility] Pager: [phone]

## 2021-01-07 NOTE — ED Notes (Signed)
Pharmacy contacted requesting they send Doxycycline for RN to administer.

## 2021-01-07 NOTE — Progress Notes (Signed)
*  PRELIMINARY RESULTS* Echocardiogram 2D Echocardiogram has been performed.  Sherrie Sport 01/07/2021, 2:14 PM

## 2021-01-07 NOTE — ED Notes (Signed)
RN spoke with patient's wife to provide update with patient permission. Wife verbalized understanding of all discussed and denies further questions at this time.

## 2021-01-07 NOTE — ED Notes (Signed)
Pt provided dinner.

## 2021-01-07 NOTE — ED Notes (Signed)
Per Dr Dwyane Dee hold nebs d/t airborne precautions and will order inhalers

## 2021-01-07 NOTE — ED Notes (Signed)
Per Dr Dwyane Dee and Dr Delaine Lame, ok to d/c airborne precautions

## 2021-01-07 NOTE — Progress Notes (Signed)
Date of Admission:  01/05/2021      ID: Jermaine Harris is a 60 y.o. male  Active Problems:   PNA (pneumonia)   Acute respiratory failure (HCC)    Subjective: Patient feeling little better today No headache Received steroids last evening that  has made him feel better Has been on heated high flow nasal cannula since yesterday Wife at bedside  Medications:  . budesonide (PULMICORT) nebulizer solution  0.5 mg Nebulization BID  . citalopram  20 mg Oral Daily  . enoxaparin (LOVENOX) injection  0.5 mg/kg Subcutaneous Q24H  . gabapentin  300 mg Oral QHS  . irbesartan  150 mg Oral Daily   And  . hydrochlorothiazide  12.5 mg Oral Daily  . insulin aspart  0-5 Units Subcutaneous QHS  . insulin aspart  0-9 Units Subcutaneous TID WC  . ipratropium-albuterol  3 mL Nebulization Q4H  . metFORMIN  500 mg Oral Daily  . methylPREDNISolone (SOLU-MEDROL) injection  20 mg Intravenous Q12H  . potassium chloride  20 mEq Oral BID    Objective: Vital signs in last 24 hours: Temp:  [98.7 F (37.1 C)-102.4 F (39.1 C)] 98.7 F (37.1 C) (02/03 0045) Pulse Rate:  [57-118] 60 (02/03 0945) Resp:  [11-27] 11 (02/03 0945) BP: (100-157)/(36-93) 132/73 (02/03 0945) SpO2:  [87 %-100 %] 96 % (02/03 0945) FiO2 (%):  [60 %-65 %] 65 % (02/03 0245)  PHYSICAL EXAM:  General: Alert, cooperative, no distress at rest,   Head: Normocephalic, without obvious abnormality, atraumatic. Eyes: Conjunctivae clear, anicteric sclerae. Pupils are equal ENT Nares normal. No drainage or sinus tenderness. Lips, mucosa, and tongue normal. No Thrush Neck: Supple, symmetrical, no adenopathy, thyroid: non tender no carotid bruit and no JVD. Back: No CVA tenderness. Lungs: Bilateral air entry.  Decreased in the bases.  Crepitations in the bases heart: Regular rate and rhythm, no murmur, rub or gallop. Abdomen: Soft, non-tender,not distended. Bowel sounds normal. No masses Extremities: atraumatic, no cyanosis. No edema. No  clubbing Skin: No rashes or lesions. Or bruising Lymph: Cervical, supraclavicular normal. Neurologic: Grossly non-focal  Lab Results Recent Labs    01/06/21 0748 01/07/21 0447  WBC 9.0 10.5  HGB 10.1* 10.2*  HCT 30.0* 31.5*  NA 132* 136  K 3.6 4.1  CL 98 103  CO2 22 20*  BUN 23* 25*  CREATININE 1.23 1.20   Liver Panel Recent Labs    01/05/21 1428 01/07/21 0447  PROT 6.6 6.0*  ALBUMIN 3.0* 2.5*  AST 72* 44*  ALT 76* 57*  ALKPHOS 66 69  BILITOT 0.6 0.7    Microbiology: Urine for Legionella antigen positive Studies/Results: DG Chest 2 View  Result Date: 01/05/2021 CLINICAL DATA:  Question normal sepsis.  Fever EXAM: CHEST - 2 VIEW COMPARISON:  01/04/2021 FINDINGS: Right lower lobe infiltrate posteriorly unchanged from the prior study. No significant pleural effusion. Mild left lower lobe airspace disease unchanged. Negative for heart failure IMPRESSION: Bibasilar infiltrates right greater than left unchanged. Electronically Signed   By: Franchot Gallo M.D.   On: 01/05/2021 15:14   CT CHEST WO CONTRAST  Result Date: 01/06/2021 CLINICAL DATA:  Respiratory failure.  Fevers for 1 week. EXAM: CT CHEST WITHOUT CONTRAST TECHNIQUE: Multidetector CT imaging of the chest was performed following the standard protocol without IV contrast. COMPARISON:  None. FINDINGS: Cardiovascular: The heart size appears normal. Aortic atherosclerosis. No pericardial effusion. Mediastinum/Nodes: No enlarged mediastinal or axillary lymph nodes. Calcified mediastinal and hilar lymph nodes are identified compatible with chronic granulomatous  disease. Thyroid gland, trachea, and esophagus demonstrate no significant findings. Lungs/Pleura: Dense airspace consolidation with air bronchograms are noted involving both lower lobes consistent with multifocal pneumonia. Right middle lobe, lingula and both upper lobes appear clear. No pleural effusion or signs of interstitial edema. Involves bilateral lower lobes with  ground Upper Abdomen: Diffuse hepatic steatosis. No acute findings identified within the imaged portions of the upper abdomen. Musculoskeletal: No chest wall mass or suspicious bone lesions identified. IMPRESSION: 1. Bilateral lower lobe airspace consolidation with air bronchograms consistent with multifocal pneumonia. 2. Hepatic steatosis. 3. Aortic atherosclerosis. Aortic Atherosclerosis (ICD10-I70.0). Electronically Signed   By: Kerby Moors M.D.   On: 01/06/2021 18:58        Assessment/Plan:  Acute respiratory illness with fever and cough and bilateral infiltrates on the chest x-ray.  Return from [place] on 12/26/2020 and felt ill after 70 to 96 hours.  No other 6 travel companions.  No other sick contacts.  Differential diagnosis was bacterial pneumonia versus viral pneumonia versus systemic illness with secondary respiratory illness.  SARS-CoV-2 was negative Respiratory viral PCR panel was negative MERS Cov test was negative which was done at [facility] lab..  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 Currently on ceftriaxone, azithromycin and doxycycline.  Pulmonary saw him and started him on low-dose steroids.  It has made him feel better.  The preferred treatment for Legionella is either Levaquin or azithromycin.  We will continue azithromycin.  But because of slow improvement he may need Levaquin.  He may need anywhere from 7 to 10 days of treatment.  Diabetes mellitus on sliding scale insulin and Metformin  Discussed the management with the patient and his wife.

## 2021-01-07 NOTE — ED Notes (Signed)
Pt alert and oriented, clear speech, NAD noted

## 2021-01-07 NOTE — Progress Notes (Signed)
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  Patient is not sure whether any of other members have the same symptoms.  Patient also reported lightheadedness and dizziness with shortness of breath. He is fully vaccinated. Infectious disease and pulmonology consulted.  Assessment & Plan:   Active Problems:   PNA (pneumonia)   Acute respiratory failure (HCC)   Acute hypoxic respiratory failure secondary to community-acquired pneumonia : Patient presents with fever, cough and bilateral infiltrate on chest x-ray. Procalcitonin  0.73 Differential diagnoses include CAP, atypical viral pneumonia. Covid, influenza and parainfluenza negative,  RSV negative. MERS Covid - pending Legionella mycoplasma antigen pending Continue ceftriaxone, zithromax and doxycycline for now. Follow blood cultures. Continue supplemental oxygen to keep saturation above 94%. He denies any mosquito bite, animal bite or tick bite. He denies any swimming in [place]. Infectious disease has been consulted and recommends: MERS COV testing, RESP viral PCR with 20 pathogens, SARS COV2 antibody, Blood cultures, and pulmonology consult as patient may need bronchoscopy if blood tests do not yield an answer. CT chest findings consistent with multifocal PNA. MRSA of the nares - negative. Pulmonology consulted,   Continue HHF and wean as tolerated. Continue solumedrol and inhalers.  Not insulin-dependent diabetes mellitus-resumed home Metformin -  Patient stopped taking metformin on his own about 2-3 weeks ago - Insulin SSI with HS coverage ordered. -Diabetes coordinator and registered dietitian ordered -Insulin SSI  AKI - Improving.  patient has had poor p.o. intake for the last 7 days and has been intermittently taking acetaminophen and ibuprofen for fever control. -LR IVF at 125 cc/h for 12 hours -Avoid nephrotoxic agents -BMP in the a.m.  Pneumonia Continue ceftriaxone and doxycycline  Hypertension-resumed home antihypertensive medications.  Anxiety/depression- Continue Xanax 0.5 mg daily as needed for anxiety, citalopram 20 mg p.o. daily     DVT prophylaxis: Lovenox Code Status:  Full code. Family Communication:Spouse at bed side. Disposition Plan:   Status is: Inpatient  Remains inpatient appropriate because:Inpatient level of care appropriate due to severity of illness   Dispo: The patient is from: Home              Anticipated d/c is to: Home              Anticipated d/c date is: > 3 days              Patient currently is not medically stable to d/c.   Difficult to place patient No   Consultants:   Infectious disease  Pulmonology  Procedures:  None.  Antimicrobials: Anti-infectives (From admission, onward)   Start     Dose/Rate Route Frequency Ordered Stop   01/06/21 2000  azithromycin (ZITHROMAX) 500 mg in sodium chloride 0.9 % 250 mL IVPB  Status:  Discontinued        500 mg 250 mL/hr over 60 Minutes Intravenous Every 24 hours 01/05/21 1843 01/05/21 2216   01/06/21 1800  azithromycin (ZITHROMAX) 500 mg in sodium chloride 0.9 % 250 mL IVPB        500 mg 250 mL/hr over 60  Minutes Intravenous Every 24 hours 01/06/21 0959     01/06/21 0600  cefTRIAXone (ROCEPHIN) 1 g in sodium chloride 0.9 % 100 mL IVPB  Status:  Discontinued        1 g 200 mL/hr over 30 Minutes Intravenous Every 24 hours 01/05/21 1843 01/05/21 2201   01/06/21 0600  cefTRIAXone (ROCEPHIN) 2 g in sodium chloride 0.9 % 100 mL  IVPB        2 g 200 mL/hr over 30 Minutes Intravenous Every 24 hours 01/05/21 2201     01/06/21 0600  doxycycline (VIBRAMYCIN) 100 mg in sodium chloride 0.9 % 250 mL IVPB        100 mg 125 mL/hr over 120 Minutes Intravenous Every 12 hours 01/05/21 2216     01/05/21 1715  cefTRIAXone (ROCEPHIN) 1 g in sodium chloride 0.9 % 100 mL IVPB        1 g 200 mL/hr over 30 Minutes Intravenous  Once 01/05/21 1704 01/05/21 1843   01/05/21 1715  azithromycin (ZITHROMAX) 500 mg in sodium chloride 0.9 % 250 mL IVPB  Status:  Discontinued        500 mg 250 mL/hr over 60 Minutes Intravenous  Once 01/05/21 1704 01/05/21 1847      Subjective: Patient was seen and examined at bedside.  Overnight events noted.   Patient reports feeling better. He reports breathing is getting better, He is on HHF ,  He denies nay fever, chills, nausea and vomiting.   Objective: Vitals:   01/07/21 1245 01/07/21 1300 01/07/21 1430 01/07/21 1500  BP: 129/77 124/67 132/74 112/62  Pulse: 60 (!) 56 66 (!) 59  Resp: (!) 24 (!) 21 (!) 24 (!) 27  Temp:      TempSrc:      SpO2: 96% 94% 97% 94%  Weight:      Height:        Intake/Output Summary (Last 24 hours) at 01/07/2021 1525 Last data filed at 01/07/2021 0731 Gross per 24 hour  Intake 600 ml  Output 325 ml  Net 275 ml   Filed Weights   01/05/21 1359  Weight: 96.2 kg    Examination:  General exam: Appears calm and comfortable, not in any distress. Respiratory system: Clear to auscultation. Respiratory effort normal. Cardiovascular system: S1 & S2 heard, RRR. No JVD, murmurs, rubs, gallops or clicks. No pedal edema. Gastrointestinal system: Abdomen is nondistended, soft and nontender. No organomegaly or masses felt. Normal bowel sounds heard. Central nervous system: Alert and oriented. No focal neurological deficits. Extremities: Symmetric 5 x 5 power., No edema. No cyanosis, No clubbing Skin: No rashes, lesions or ulcers Psychiatry: Judgement and insight appear  normal. Mood & affect appropriate.     Data Reviewed: I have personally reviewed following labs and imaging studies  CBC: Recent Labs  Lab 01/04/21 1306 01/05/21 1428 01/06/21 0748 01/07/21 0447  WBC 9.4 10.4 9.0 10.5  NEUTROABS 7,802* 8.3*  --   --   HGB 12.6* 11.5* 10.1* 10.2*  HCT 38.1* 33.5* 30.0* 31.5*  MCV 84.1 82.7 82.9 84.9  PLT 212 208 215 696   Basic Metabolic Panel: Recent Labs  Lab 01/04/21 1306 01/05/21 1428 01/06/21 0748 01/07/21 0447  NA 135 134* 132* 136  K 3.5 3.1* 3.6 4.1  CL 99 99 98 103  CO2 _0 20*  GLUCOSE 237* 235* 165* 249*  BUN 24* 27* 23* 25*  CREATININE 1.52* 1.48* 1.23 1.20  CALCIUM 8.3* 8.0* 7.8* 8.0*  MG  --   --  2.1 2.6*  PHOS  --   --   --  3.8   GFR: Estimated Creatinine Clearance: 72.7 mL/min (by C-G formula based on SCr of 1.2 mg/dL). Liver Function Tests: Recent Labs  Lab 01/04/21 1306 01/05/21 1428 01/07/21 0447  AST 26 72* 44*  ALT 38 76* 57*  ALKPHOS 64 66 62  BILITOT 0.6 0.6 0.7  PROT 6.0 6.6 6.0*  ALBUMIN 3.4* 3.0* 2.5*   No results for input(s): LIPASE, AMYLASE in the last 168 hours. No results for input(s): AMMONIA in the last 168 hours. Coagulation Profile: No results for input(s): INR, PROTIME in the last 168 hours. Cardiac Enzymes: No results for input(s): CKTOTAL, CKMB, CKMBINDEX, TROPONINI in the last 168 hours. BNP (last 3 results) No results for input(s): PROBNP in the last 8760 hours. HbA1C: Recent Labs    01/05/21 1426  HGBA1C 7.8*   CBG: Recent Labs  Lab 01/06/21 0744 01/06/21 1716 01/06/21 2130 01/07/21 0730 01/07/21 1123  GLUCAP 139* 156* 217* 246* 288*   Lipid Profile: No results for input(s): CHOL, HDL, LDLCALC, TRIG, CHOLHDL, LDLDIRECT in the last 72 hours. Thyroid Function Tests: No results for input(s): TSH, T4TOTAL, FREET4, T3FREE, THYROIDAB in the last 72 hours. Anemia Panel: No results for input(s): VITAMINB12, FOLATE, FERRITIN, TIBC, IRON, RETICCTPCT in the last 72  hours. Sepsis Labs: Recent Labs  Lab 01/05/21 1426 01/05/21 1428 01/06/21 0748 01/07/21 0447  PROCALCITON  --  <0.10 0.73 0.71  LATICACIDVEN 1.3  --   --   --     Recent Results (from the past 240 hour(s))  Novel Coronavirus, NAA ([facility])     Status: None   Collection Time: 12/31/20  2:35 PM   Specimen: Nasopharyngeal(NP) swabs in vial transport medium   Nasopharynge  Result Value Ref Range Status   SARS-CoV-2, NAA Not Detected Not Detected Final    Comment: This nucleic acid amplification test was developed and its performance characteristics determined by [employer]. Nucleic acid amplification tests include RT-PCR and TMA. This test has not been FDA cleared or approved. This test has been authorized by FDA under an Emergency Use Authorization (EUA). This test is only authorized for the duration of time the declaration that circumstances exist justifying the authorization of the emergency use of in vitro diagnostic tests for detection of SARS-CoV-2 virus and/or diagnosis of COVID-19 infection under section 564(b)(1) of the Act, 21 U.S.C. 735HGD-9(M) (1), unless the authorization is terminated or revoked sooner. When diagnostic testing is negative, the possibility of a false negative result should be considered in the context of a patient's recent exposures and the presence of clinical signs and symptoms consistent with COVID-19. An individual without symptoms of COVID-19 and who is not shedding SARS-CoV-2 virus wo uld expect to have a negative (not detected) result in this assay.   SARS-COV-2, NAA 2 DAY TAT     Status: None   Collection Time: 12/31/20  2:35 PM   Nasopharynge  Result Value Ref Range Status   SARS-CoV-2, NAA 2 DAY TAT Performed  Final  Culture, blood (single) w Reflex to ID Panel     Status: None (Preliminary result)   Collection Time: 01/04/21  1:18 PM   Specimen: Blood  Result Value Ref Range Status   MICRO NUMBER: [id number]  Preliminary    SPECIMEN QUALITY: Adequate  Preliminary   Source NOT GIVEN  Preliminary   STATUS: PRELIMINARY  Preliminary   Result:   Preliminary    No growth to date. Culture is  continuously monitored for a total of 120 hours incubation. A change in status will result in a phone report followed by an updated printed culture report.   COMMENT: Aerobic and anaerobic bottle received.  Preliminary  Culture, blood (single) w Reflex to ID Panel     Status: None (Preliminary result)   Collection Time: 01/04/21  1:18 PM   Specimen: Blood  Result Value Ref Range Status   MICRO NUMBER: [id number]  Preliminary   SPECIMEN QUALITY: Adequate  Preliminary   Source BLOOD  Preliminary   STATUS: PRELIMINARY  Preliminary   Result:   Preliminary    No growth to date. Culture is continuously monitored for a total of 120 hours incubation. A change in status will result in a phone report followed by an updated printed culture report.   COMMENT: Aerobic and anaerobic bottle received.  Preliminary  SARS Coronavirus 2 by RT PCR (hospital order, performed in [facility] hospital lab) Nasopharyngeal Nasopharyngeal Swab     Status: None   Collection Time: 01/05/21  2:32 PM   Specimen: Nasopharyngeal Swab  Result Value Ref Range Status   SARS Coronavirus 2 NEGATIVE NEGATIVE Final    Comment: (NOTE) SARS-CoV-2 target nucleic acids are NOT DETECTED.  The SARS-CoV-2 RNA is generally detectable in upper and lower respiratory specimens during the acute phase of infection. The lowest concentration of SARS-CoV-2 viral copies this assay can detect is 250 copies / mL. A negative result does not preclude SARS-CoV-2 infection and should not be used as the sole basis for treatment or other patient management decisions.  A negative result may occur with improper specimen collection / handling, submission of specimen other than nasopharyngeal swab, presence of viral mutation(s) within the areas targeted by this assay, and inadequate number of  viral copies (<250 copies / mL). A negative result must be combined with clinical observations, patient history, and epidemiological information.  Fact Sheet for Patients:   StrictlyIdeas.no  Fact Sheet for Healthcare Providers: BankingDealers.co.za  This test is not yet approved or  cleared by the [place] FDA and has been authorized for detection and/or diagnosis of SARS-CoV-2 by FDA under an Emergency Use Authorization (EUA).  This EUA will remain in effect (meaning this test can be used) for the duration of the COVID-19 declaration under Section 564(b)(1) of the Act, 21 U.S.C. section 360bbb-3(b)(1), unless the authorization is terminated or revoked sooner.  Performed at [facility], [place],  [address]   Respiratory (~20 pathogens) panel by PCR     Status: None   Collection Time: 01/05/21  5:14 PM   Specimen: Flu Kit Nasopharyngeal Swab; Respiratory  Result Value Ref Range Status   Adenovirus NOT DETECTED NOT DETECTED Final   Coronavirus 229E NOT DETECTED NOT DETECTED Final    Comment: (NOTE) The Coronavirus on the Respiratory Panel, DOES NOT test for the novel  Coronavirus (2019 nCoV)    Coronavirus HKU1 NOT DETECTED NOT DETECTED Final   Coronavirus NL63 NOT DETECTED NOT DETECTED Final   Coronavirus OC43 NOT DETECTED NOT DETECTED Final   Metapneumovirus NOT DETECTED NOT DETECTED Final   Rhinovirus / Enterovirus NOT DETECTED NOT DETECTED Final   Influenza A NOT DETECTED NOT DETECTED Final   Influenza B NOT DETECTED NOT DETECTED Final   Parainfluenza Virus 1 NOT DETECTED NOT DETECTED Final   Parainfluenza Virus 2 NOT DETECTED NOT DETECTED Final   Parainfluenza Virus 3 NOT DETECTED NOT DETECTED Final   Parainfluenza Virus 4 NOT DETECTED NOT  DETECTED Final   Respiratory Syncytial Virus NOT DETECTED NOT DETECTED Final   Bordetella pertussis NOT DETECTED NOT DETECTED Final   Bordetella  Parapertussis NOT DETECTED NOT DETECTED Final   Chlamydophila pneumoniae NOT DETECTED NOT DETECTED Final   Mycoplasma pneumoniae NOT DETECTED NOT DETECTED Final    Comment: Performed at [facility], [place] [address] [place], [place] [address]  MRSA PCR Screening     Status: None   Collection Time: 01/06/21  4:01 AM   Specimen: Urine, Clean Catch; Nasopharyngeal  Result Value Ref Range Status   MRSA by PCR NEGATIVE NEGATIVE Final    Comment:        The GeneXpert MRSA Assay (FDA approved for NASAL specimens only), is one component of a comprehensive MRSA colonization surveillance program. It is not intended to diagnose MRSA infection nor to guide or monitor treatment for MRSA infections. Performed at [facility], [address], [place], [place] [address]     Radiology Studies: CT CHEST WO CONTRAST  Result Date: 01/06/2021 CLINICAL DATA:  Respiratory failure.  Fevers for 1 week. EXAM: CT CHEST WITHOUT CONTRAST TECHNIQUE: Multidetector CT imaging of the chest was performed following the standard protocol without IV contrast. COMPARISON:  None. FINDINGS: Cardiovascular: The heart size appears normal. Aortic atherosclerosis. No pericardial effusion. Mediastinum/Nodes: No enlarged mediastinal or axillary lymph nodes. Calcified mediastinal and hilar lymph nodes are identified compatible with chronic granulomatous disease. Thyroid gland, trachea, and esophagus demonstrate no significant findings. Lungs/Pleura: Dense airspace consolidation with air bronchograms are noted involving both lower lobes consistent with multifocal pneumonia. Right middle lobe, lingula and both upper lobes appear clear. No pleural effusion or signs of interstitial edema. Involves bilateral lower lobes with ground Upper Abdomen: Diffuse hepatic steatosis. No acute findings identified within the imaged portions of the upper abdomen. Musculoskeletal: No chest wall mass or suspicious bone lesions identified.  IMPRESSION: 1. Bilateral lower lobe airspace consolidation with air bronchograms consistent with multifocal pneumonia. 2. Hepatic steatosis. 3. Aortic atherosclerosis. Aortic Atherosclerosis (ICD10-I70.0). Electronically Signed   By: Kerby Moors M.D.   On: 01/06/2021 18:58   Scheduled Meds: . budesonide (PULMICORT) nebulizer solution  0.5 mg Nebulization BID  . citalopram  20 mg Oral Daily  . enoxaparin (LOVENOX) injection  0.5 mg/kg Subcutaneous Q24H  . gabapentin  300 mg Oral QHS  . irbesartan  150 mg Oral Daily   And  . hydrochlorothiazide  12.5 mg Oral Daily  . insulin aspart  0-5 Units Subcutaneous QHS  . insulin aspart  0-9 Units Subcutaneous TID WC  . ipratropium-albuterol  3 mL Nebulization Q4H  . metFORMIN  500 mg Oral Daily  . methylPREDNISolone (SOLU-MEDROL) injection  20 mg Intravenous Q12H  . potassium chloride  20 mEq Oral BID   Continuous Infusions: . sodium chloride 100 mL/hr at 01/07/21 0055  . azithromycin Stopped (01/06/21 1913)  . cefTRIAXone (ROCEPHIN)  IV Stopped (01/07/21 0546)  . doxycycline (VIBRAMYCIN) IV Stopped (01/07/21 0731)     LOS: 1 day    Time spent: 25 mins.    Shawna Clamp, MD [facility]   If 7PM-7AM, please contact night-coverage

## 2021-01-07 NOTE — ED Notes (Signed)
Pt resting at this time, NAD noted, RR even and unlabored, wife at bedside

## 2021-01-07 NOTE — ED Notes (Signed)
Pt eating breakfast at this time.  

## 2021-01-07 NOTE — ED Notes (Signed)
Patient accidentally removed IV while sleeping. New IV placed and AM blood work collected. IVF transitioned to new line. Patient provided with new gown and blood pressure cuff. 2 warm blankets provided as well. Cardiac monitor remains in place. Pt breathing unlabored with occasional dry cough and is speaking in full sentences. Symmetric chest rise and fall noted. Pt denies pain. Pt in hospital bed with bed low and locked, side rails raised x3 and call bell in reach. Denies further needs at this time.

## 2021-01-08 ENCOUNTER — Telehealth: Payer: BC Managed Care – PPO | Admitting: Family Medicine

## 2021-01-08 DIAGNOSIS — A481 Legionnaires' disease: Secondary | ICD-10-CM | POA: Diagnosis not present

## 2021-01-08 DIAGNOSIS — J189 Pneumonia, unspecified organism: Secondary | ICD-10-CM | POA: Diagnosis not present

## 2021-01-08 DIAGNOSIS — J9601 Acute respiratory failure with hypoxia: Secondary | ICD-10-CM | POA: Diagnosis not present

## 2021-01-08 LAB — GLUCOSE, CAPILLARY
Glucose-Capillary: 229 mg/dL — ABNORMAL HIGH (ref 70–99)
Glucose-Capillary: 233 mg/dL — ABNORMAL HIGH (ref 70–99)
Glucose-Capillary: 186 mg/dL — ABNORMAL HIGH (ref 70–99)
Glucose-Capillary: 188 mg/dL — ABNORMAL HIGH (ref 70–99)

## 2021-01-08 LAB — COMPREHENSIVE METABOLIC PANEL
ALT: 44 U/L (ref 0–44)
AST: 27 U/L (ref 15–41)
Albumin: 2.3 g/dL — ABNORMAL LOW (ref 3.5–5.0)
Alkaline Phosphatase: 60 U/L (ref 38–126)
Anion gap: 9 (ref 5–15)
BUN: 37 mg/dL — ABNORMAL HIGH (ref 6–20)
CO2: 22 mmol/L (ref 22–32)
Calcium: 8.1 mg/dL — ABNORMAL LOW (ref 8.9–10.3)
Chloride: 105 mmol/L (ref 98–111)
Creatinine, Ser: 1.12 mg/dL (ref 0.61–1.24)
GFR, Estimated: >60 mL/min (ref >60)
Glucose, Bld: 262 mg/dL — ABNORMAL HIGH (ref 70–99)
Potassium: 4.8 mmol/L (ref 3.5–5.1)
Sodium: 136 mmol/L (ref 135–145)
Total Bilirubin: 0.4 mg/dL (ref 0.3–1.2)
Total Protein: 5.6 g/dL — ABNORMAL LOW (ref 6.5–8.1)

## 2021-01-08 LAB — CBC
HCT: 29.4 % — ABNORMAL LOW (ref 39.0–52.0)
Hemoglobin: 9.8 g/dL — ABNORMAL LOW (ref 13.0–17.0)
MCH: 28.2 pg (ref 26.0–34.0)
MCHC: 33.3 g/dL (ref 30.0–36.0)
MCV: 84.5 fL (ref 80.0–100.0)
Platelets: 300 10*3/uL (ref 150–400)
RBC: 3.48 MIL/uL — ABNORMAL LOW (ref 4.22–5.81)
RDW: 13.7 % (ref 11.5–15.5)
WBC: 13.7 10*3/uL — ABNORMAL HIGH (ref 4.0–10.5)
nRBC: 0 % (ref 0.0–0.2)

## 2021-01-08 LAB — PHOSPHORUS: Phosphorus: 3.1 mg/dL (ref 2.5–4.6)

## 2021-01-08 LAB — PROCALCITONIN: Procalcitonin: 0.57 ng/mL

## 2021-01-08 LAB — MAGNESIUM: Magnesium: 2.5 mg/dL — ABNORMAL HIGH (ref 1.7–2.4)

## 2021-01-08 MED ORDER — INSULIN GLARGINE 100 UNIT/ML ~~LOC~~ SOLN
20.0000 [IU] | Freq: Every day | SUBCUTANEOUS | Status: DC
  Administered 2021-01-08 – 2021-01-15 (×7): 20 [IU] via SUBCUTANEOUS
  Filled 2021-01-08 (×8): qty 0.2

## 2021-01-08 MED ORDER — METHYLPREDNISOLONE 4 MG PO TBPK: 8.0000 mg | ORAL_TABLET | Freq: Every evening | ORAL | Status: DC

## 2021-01-08 MED ORDER — LEVOFLOXACIN IN D5W 750 MG/150ML IV SOLN
750.0000 mg | INTRAVENOUS | Status: DC
Start: 2021-01-08 — End: 2021-01-15
  Administered 2021-01-08 – 2021-01-14 (×7): 750 mg via INTRAVENOUS
  Filled 2021-01-08 (×8): qty 150

## 2021-01-08 MED ORDER — METHYLPREDNISOLONE 4 MG PO TBPK
8.0000 mg | ORAL_TABLET | Freq: Every morning | ORAL | Status: AC
  Filled 2021-01-08: qty 21

## 2021-01-08 MED ORDER — RISAQUAD PO CAPS
1.0000 | ORAL_CAPSULE | Freq: Every day | ORAL | Status: DC
  Administered 2021-01-08 – 2021-01-15 (×7): 1 via ORAL
  Filled 2021-01-08 (×8): qty 1

## 2021-01-08 MED ORDER — METHYLPREDNISOLONE 4 MG PO TBPK
4.0000 mg | ORAL_TABLET | ORAL | Status: AC
  Administered 2021-01-08: 4 mg via ORAL

## 2021-01-08 MED ORDER — LIVING WELL WITH DIABETES BOOK
Freq: Once | Status: AC
  Filled 2021-01-08: qty 1

## 2021-01-08 MED ORDER — METHYLPREDNISOLONE 4 MG PO TBPK: 4.0000 mg | ORAL_TABLET | ORAL | Status: AC

## 2021-01-08 MED ORDER — SALINE SPRAY 0.65 % NA SOLN
2.0000 | NASAL | Status: DC | PRN
  Administered 2021-01-09 – 2021-01-10 (×3): 2 via NASAL
  Filled 2021-01-08: qty 44

## 2021-01-08 MED ORDER — METHYLPREDNISOLONE 4 MG PO TBPK
4.0000 mg | ORAL_TABLET | Freq: Three times a day (TID) | ORAL | Status: DC
  Administered 2021-01-09: 4 mg via ORAL

## 2021-01-08 MED ORDER — ADULT MULTIVITAMIN W/MINERALS CH
1.0000 | ORAL_TABLET | Freq: Every day | ORAL | Status: DC
  Administered 2021-01-09 – 2021-01-12 (×4): 1 via ORAL
  Filled 2021-01-08 (×4): qty 1

## 2021-01-08 MED ORDER — METHYLPREDNISOLONE 4 MG PO TBPK
8.0000 mg | ORAL_TABLET | Freq: Every evening | ORAL | Status: AC
  Administered 2021-01-08: 8 mg via ORAL

## 2021-01-08 MED ORDER — METHYLPREDNISOLONE 4 MG PO TBPK: 4.0000 mg | ORAL_TABLET | Freq: Four times a day (QID) | ORAL | Status: DC

## 2021-01-08 MED ORDER — ENSURE MAX PROTEIN PO LIQD
11.0000 [oz_av] | Freq: Two times a day (BID) | ORAL | Status: DC
  Administered 2021-01-08 – 2021-01-10 (×2): 11 [oz_av] via ORAL
  Filled 2021-01-08: qty 330

## 2021-01-08 NOTE — Consult Note (Signed)
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   He left for [place] on 12/17/20 and reaced there eon 12/18/20 , stayed in [place] for a week, mostly had meeting in the hotel and visited certain places like [employer] . He spent a day in [place] and returned to the [place] on 12/26/20.He was found to be acutely hypoxemic in respiratory distress requiring HFNC. During my interview he has been weaned to HFNC at 15L/min    PAST MEDICAL HISTORY   Past Medical History:  Diagnosis Date  . Actinic keratosis   . Claustrophobia   . GAD (generalized anxiety disorder)    claustrophobia, some panic attacks  . HTN (hypertension)   . Irritable bowel syndrome   . Lumbar herniated disc 06/2015  . Seasonal allergies      SURGICAL HISTORY   Past Surgical History:  Procedure Laterality Date  . COLON SURGERY    . COLONOSCOPY  2012   mild diverticulosis, rec rpt 5 yrs 2/2 fmhx polyps  . COLONOSCOPY WITH PROPOFOL N/A 05/29/2019   TA, diverticulosis, rpt 5 yrs (Bonna Gains, Lennette Bihari, MD)  . MICRODISCECTOMY LUMBAR  07/2015   L4/5/S1 Trenton Gammon)  . TONSILLECTOMY       FAMILY HISTORY   Family History  Problem Relation Age of Onset  . Heart disease Father        pacer and CHF  . Depression Father   . Transient ischemic attack Mother 65       several  . Depression Mother   . Colon polyps Sister   . Sudden death Maternal Grandfather 68       ?mustard gas     SOCIAL HISTORY   Social History    Tobacco Use  . Smoking status: Never Smoker  . Smokeless tobacco: Never Used  Vaping Use  . Vaping Use: Never used  Substance Use Topics  . Alcohol use: Yes    Alcohol/week: 0.0 standard drinks    Comment: couple of drinks per week  . Drug use: Never     MEDICATIONS    Home Medication:    Current Medication:  Current Facility-Administered Medications:  .  0.9 %  sodium chloride infusion, , Intravenous, Continuous, Shawna Clamp, MD, Last Rate: 100 mL/hr at 01/08/21 1352, New Bag at 01/08/21 1352 .  acetaminophen (TYLENOL) tablet 650 mg, 650 mg, Oral, Q6H PRN, 650 mg at 01/08/21 0953 **OR** acetaminophen (TYLENOL) suppository 650 mg, 650 mg, Rectal, Q6H PRN, Sharion Settler, NP .  albuterol (VENTOLIN HFA) 108 (90 Base) MCG/ACT inhaler 2 puff, 2 puff, Inhalation, Q6H PRN, Shawna Clamp, MD .  ALPRAZolam Duanne Moron) tablet 0.5 mg, 0.5 mg, Oral, Daily PRN, Cox, Amy N, DO, 0.5 mg at 01/06/21 1430 .  azithromycin (ZITHROMAX) 500 mg in sodium chloride 0.9 % 250 mL IVPB, 500 mg, Intravenous, Q24H, Tsosie Billing, MD, Stopped at 01/07/21 1834 .  budesonide (PULMICORT) nebulizer solution 0.5 mg, 0.5 mg, Nebulization, BID, Kasa, Kurian, MD, 0.5 mg at 01/07/21 2045 .  citalopram (CELEXA) tablet 20 mg, 20 mg, Oral, Daily, Cox, Amy N, DO, 20 mg at 01/08/21 0949 .  enoxaparin (LOVENOX) injection 47.5 mg, 0.5 mg/kg, Subcutaneous, Q24H, Cox, Amy N, DO, 47.5 mg at 01/07/21 2108 .  fentaNYL (SUBLIMAZE) injection 25 mcg, 25 mcg, Intravenous, Q4H PRN, Cox, Amy N, DO, 25 mcg at 01/06/21 0630 .  gabapentin (NEURONTIN) capsule 300 mg, 300 mg, Oral, QHS, Cox, Amy N, DO, 300 mg at 01/07/21 2107 .  irbesartan (AVAPRO) tablet 150 mg, 150 mg, Oral, Daily, 150 mg at 01/08/21 0949 **AND** hydrochlorothiazide (MICROZIDE) capsule 12.5 mg, 12.5 mg, Oral, Daily, Cox, Amy N, DO, 12.5 mg at 01/08/21 0949 .  ibuprofen (ADVIL) tablet 600 mg, 600 mg, Oral, Q8H PRN, Shawna Clamp, MD, 600 mg at 01/06/21 0756 .   insulin aspart (novoLOG) injection 0-5 Units, 0-5 Units, Subcutaneous, QHS, Cox, Amy N, DO, 3 Units at 01/07/21 2123 .  insulin aspart (novoLOG) injection 0-9 Units, 0-9 Units, Subcutaneous, TID WC, Cox, Amy N, DO, 3 Units at 01/08/21 1236 .  insulin glargine (LANTUS) injection 20 Units, 20 Units, Subcutaneous, Daily, Shawna Clamp, MD, 20 Units at 01/08/21 1235 .  ipratropium-albuterol (DUONEB) 0.5-2.5 (3) MG/3ML nebulizer solution 3 mL, 3 mL, Nebulization, Q4H, Kasa, Kurian, MD, 3 mL at 01/08/21 1133 .  metFORMIN (GLUCOPHAGE-XR) 24 hr tablet 500 mg, 500 mg, Oral, Daily, Cox, Amy N, DO, 500 mg at 01/08/21 0949 .  methylPREDNISolone sodium succinate (SOLU-MEDROL) 40 mg/mL injection 20 mg, 20 mg, Intravenous, Q12H, Kasa, Kurian, MD, 20 mg at 01/08/21 0521 .  metoprolol tartrate (LOPRESSOR) injection 5 mg, 5 mg, Intravenous, Q4H PRN, Cox, Amy N, DO .  morphine 2 MG/ML injection 2 mg, 2 mg, Intravenous, Q2H PRN, Flora Lipps, MD, 2 mg at 01/07/21 2237 .  [START ON 01/09/2021] multivitamin with minerals tablet 1 tablet, 1 tablet, Oral, Daily, Dwyane Dee, Pardeep, MD .  ondansetron (ZOFRAN) tablet 4 mg, 4 mg, Oral, Q6H PRN **OR** ondansetron (ZOFRAN) injection 4 mg, 4 mg, Intravenous, Q6H PRN, Cox, Amy N, DO .  potassium chloride SA (KLOR-CON) CR tablet 20 mEq, 20 mEq, Oral, BID, Cox, Amy N, DO, 20 mEq at 01/08/21 1236 .  protein supplement (ENSURE MAX) liquid, 11 oz, Oral, BID, Shawna Clamp, MD    ALLERGIES   Ace inhibitors     REVIEW OF SYSTEMS    Review of Systems:  Gen:  Denies  fever, sweats, chills weigh loss  HEENT: Denies blurred vision, double vision, ear pain, eye pain, hearing loss, nose bleeds, sore throat Cardiac:  No dizziness, chest pain or heaviness, chest tightness,edema Resp:   Reports severe dyspnea Gi: Denies swallowing difficulty, stomach pain, nausea or vomiting, diarrhea, constipation, bowel incontinence Gu:  Denies bladder incontinence, burning urine Ext:   Denies  Joint pain, stiffness or swelling Skin: Denies  skin rash, easy bruising or bleeding or hives Endoc:  Denies polyuria, polydipsia , polyphagia or weight change Psych:   Denies depression, insomnia or hallucinations   Other:  All other systems negative   VS: BP 126/78 (BP Location: Left Arm)   Pulse 77   Temp 98.4 F (36.9 C) (Oral)   Resp 19   Ht 5' 7.25" (1.708 m)   Wt 96.2 kg   SpO2 95%   BMI 32.96 kg/m      PHYSICAL EXAM    GENERAL:NAD, no fevers, chills, no weakness no  fatigue HEAD: Normocephalic, atraumatic.  EYES: Pupils equal, round, reactive to light. Extraocular muscles intact. No scleral icterus.  MOUTH: Moist mucosal membrane. Dentition intact. No abscess noted.  EAR, NOSE, THROAT: Clear without exudates. No external lesions.  NECK: Supple. No thyromegaly. No nodules. No JVD.  PULMONARY: Diffuse coarse rhonchi right sided +wheezes CARDIOVASCULAR: S1 and S2. Regular rate and rhythm. No murmurs, rubs, or gallops. No edema. Pedal pulses 2+ bilaterally.  GASTROINTESTINAL: Soft, nontender, nondistended. No masses. Positive bowel sounds. No hepatosplenomegaly.  MUSCULOSKELETAL: No swelling, clubbing, or edema. Range of motion full in all extremities.  NEUROLOGIC: Cranial nerves II through XII are intact. No gross focal neurological deficits. Sensation intact. Reflexes intact.  SKIN: No ulceration, lesions, rashes, or cyanosis. Skin warm and dry. Turgor intact.  PSYCHIATRIC: Mood, affect within normal limits. The patient is awake, alert and oriented x 3. Insight, judgment intact.       IMAGING    DG Chest 2 View  Result Date: 01/05/2021 CLINICAL DATA:  Question normal sepsis.  Fever EXAM: CHEST - 2 VIEW COMPARISON:  01/04/2021 FINDINGS: Right lower lobe infiltrate posteriorly unchanged from the prior study. No significant pleural effusion. Mild left lower lobe airspace disease unchanged. Negative for heart failure IMPRESSION: Bibasilar infiltrates right greater than  left unchanged. Electronically Signed   By: Franchot Gallo M.D.   On: 01/05/2021 15:14   DG Chest 2 View  Result Date: 01/05/2021 CLINICAL DATA:  Dry cough, fever for 7 8 days EXAM: CHEST - 2 VIEW COMPARISON:  None. FINDINGS: The heart size and mediastinal contours are within normal limits. Heterogeneous airspace opacity of the dependent right lower lobe. The visualized skeletal structures are unremarkable. IMPRESSION: Heterogeneous airspace opacity of the dependent right lower lobe, consistent with infection or aspiration. Recommend follow-up radiographs in 6-8 weeks to ensure complete radiographic resolution and exclude underlying malignancy. Electronically Signed   By: Eddie Candle M.D.   On: 01/05/2021 08:19   CT CHEST WO CONTRAST  Result Date: 01/06/2021 CLINICAL DATA:  Respiratory failure.  Fevers for 1 week. EXAM: CT CHEST WITHOUT CONTRAST TECHNIQUE: Multidetector CT imaging of the chest was performed following the standard protocol without IV contrast. COMPARISON:  None. FINDINGS: Cardiovascular: The heart size appears normal. Aortic atherosclerosis. No pericardial effusion. Mediastinum/Nodes: No enlarged mediastinal or axillary lymph nodes. Calcified mediastinal and hilar lymph nodes are identified compatible with chronic granulomatous disease. Thyroid gland, trachea, and esophagus demonstrate no significant findings. Lungs/Pleura: Dense airspace consolidation with air bronchograms are noted involving both lower lobes consistent with multifocal pneumonia. Right middle lobe, lingula and both upper lobes appear clear. No pleural effusion or signs of interstitial edema. Involves bilateral lower lobes with ground Upper Abdomen: Diffuse hepatic steatosis. No acute findings identified within the imaged portions of the upper abdomen. Musculoskeletal: No chest wall mass or suspicious bone lesions identified. IMPRESSION: 1. Bilateral lower lobe airspace consolidation with air bronchograms consistent with  multifocal pneumonia. 2. Hepatic steatosis. 3. Aortic atherosclerosis. Aortic Atherosclerosis (ICD10-I70.0). Electronically Signed   By: Kerby Moors M.D.   On: 01/06/2021 18:58   ECHOCARDIOGRAM COMPLETE  Result Date: 01/07/2021    ECHOCARDIOGRAM REPORT   Patient Name:   Jermaine Harris Date of Exam: 01/07/2021 Medical Rec #:  [id number]      Height:       67.2 in Accession #:    [id number]     Weight:       212.0 lb Date of Birth:  18-Jun-1961      BSA:  2.078 m Patient Age:    59 years       BP:           124/67 mmHg Patient Gender: M              HR:           56 bpm. Exam Location:  [facility] Procedure: 2D Echo, Cardiac Doppler and Color Doppler Indications:     Acute respiratory distress R06.03  History:         Patient has no prior history of Echocardiogram examinations.                  Risk Factors:Hypertension.  Sonographer:     Sherrie Sport RDCS (AE) Referring Phys:  [id number] Awilda Bill Diagnosing Phys: Ida Rogue MD IMPRESSIONS  1. Left ventricular ejection fraction, by estimation, is 55 to 60%. The left ventricle has normal function. The left ventricle has no regional wall motion abnormalities. Left ventricular diastolic parameters are consistent with Grade II diastolic dysfunction (pseudonormalization).  2. Right ventricular systolic function is normal. The right ventricular size is normal. There is mildly elevated pulmonary artery systolic pressure. The estimated right ventricular systolic pressure is 73.5 mmHg.  3. Left atrial size was mildly dilated.  4. The mitral valve is normal in structure. Mild to moderate mitral valve regurgitation. FINDINGS  Left Ventricle: Left ventricular ejection fraction, by estimation, is 55 to 60%. The left ventricle has normal function. The left ventricle has no regional wall motion abnormalities. The left ventricular internal cavity size was normal in size. There is  no left ventricular hypertrophy. Left ventricular diastolic parameters are consistent with  Grade II diastolic dysfunction (pseudonormalization). Right Ventricle: The right ventricular size is normal. No increase in right ventricular wall thickness. Right ventricular systolic function is normal. There is mildly elevated pulmonary artery systolic pressure. The tricuspid regurgitant velocity is 2.80  m/s, and with an assumed right atrial pressure of 5 mmHg, the estimated right ventricular systolic pressure is 32.9 mmHg. Left Atrium: Left atrial size was mildly dilated. Right Atrium: Right atrial size was normal in size. Pericardium: There is no evidence of pericardial effusion. Mitral Valve: The mitral valve is normal in structure. Mild mitral annular calcification. Mild to moderate mitral valve regurgitation. No evidence of mitral valve stenosis. Tricuspid Valve: The tricuspid valve is normal in structure. Tricuspid valve regurgitation is not demonstrated. No evidence of tricuspid stenosis. Aortic Valve: The aortic valve is normal in structure. Aortic valve regurgitation is not visualized. Mild aortic valve sclerosis is present, with no evidence of aortic valve stenosis. Aortic valve mean gradient measures 3.0 mmHg. Aortic valve peak gradient measures 4.4 mmHg. Aortic valve area, by VTI measures 2.76 cm. Pulmonic Valve: The pulmonic valve was normal in structure. Pulmonic valve regurgitation is not visualized. No evidence of pulmonic stenosis. Aorta: The aortic root is normal in size and structure. Venous: The inferior vena cava is normal in size with greater than 50% respiratory variability, suggesting right atrial pressure of 3 mmHg. IAS/Shunts: No atrial level shunt detected by color flow Doppler.  LEFT VENTRICLE PLAX 2D LVIDd:         5.01 cm  Diastology LVIDs:         3.32 cm  LV e' medial:    3.65 cm/s LV PW:         1.24 cm  LV E/e' medial:  24.3 LV IVS:        0.86 cm  LV e' lateral:  11.50 cm/s LVOT diam:     2.10 cm  LV E/e' lateral: 7.7 LV SV:         67 LV SV Index:   32 LVOT Area:     3.46  cm  RIGHT VENTRICLE RV Basal diam:  4.35 cm RV S prime:     11.91 cm/s TAPSE (M-mode): 4.2 cm LEFT ATRIUM             Index       RIGHT ATRIUM           Index LA diam:        4.40 cm 2.12 cm/m  RA Area:     27.00 cm LA Vol (A2C):   79.7 ml 38.35 ml/m RA Volume:   93.50 ml  44.99 ml/m LA Vol (A4C):   91.4 ml 43.98 ml/m LA Biplane Vol: 85.7 ml 41.24 ml/m  AORTIC VALVE                   PULMONIC VALVE AV Area (Vmax):    2.52 cm    PV Vmax:        0.64 m/s AV Area (Vmean):   2.52 cm    PV Peak grad:   1.6 mmHg AV Area (VTI):     2.76 cm    RVOT Peak grad: 3 mmHg AV Vmax:           105.00 cm/s AV Vmean:          79.600 cm/s AV VTI:            0.241 m AV Peak Grad:      4.4 mmHg AV Mean Grad:      3.0 mmHg LVOT Vmax:         76.40 cm/s LVOT Vmean:        57.900 cm/s LVOT VTI:          0.192 m LVOT/AV VTI ratio: 0.80  AORTA Ao Root diam: 3.43 cm MITRAL VALVE               TRICUSPID VALVE MV Area (PHT): 5.62 cm    TR Peak grad:   31.4 mmHg MV Decel Time: 135 msec    TR Vmax:        280.00 cm/s MV E velocity: 88.70 cm/s MV A velocity: 61.30 cm/s  SHUNTS MV E/A ratio:  1.45        Systemic VTI:  0.19 m                            Systemic Diam: 2.10 cm Ida Rogue MD Electronically signed by Ida Rogue MD Signature Date/Time: 01/07/2021/3:36:44 PM    Final       ASSESSMENT/PLAN   Acute hypoxemic respiratory failure Due to 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       Thank you for allowing me to participate in the care of this patient.   Patient/Family are satisfied with care plan and all questions have been answered.  This document was prepared using Dragon voice recognition software and may include unintentional dictation errors.     Ottie Glazier, M.D.  Division of [facility]

## 2021-01-08 NOTE — Progress Notes (Signed)
Pt received to 2A, room 323 from ED at this time.  Pt oriented to room and call bell and remote.  Heart monitor placed and verified.  Skin checked with myself and Anthony,RN.  Pt alert and oriented x4, MAE, although reports weakness.  Skin warm and dry.  Pt wears glasses but denies hearing problems or issues with teetch.  LBM 01/06/21.  No edema noted.  Pulses in feet palpable.  Pt denies pain or distress.  Diet gingerale provided per request.  Inspiiatory wheezes throughout with fine crackles RLL.  S1S2.  NSR.  No other voiced or observed needs at this time.  Will cont to observe.

## 2021-01-08 NOTE — Progress Notes (Signed)
Mobility Specialist - Progress Note   01/08/21 1129  Mobility  Activity Ambulated in room  Level of Assistance Modified independent, requires aide device or extra time  Assistive Device None  Distance Ambulated (ft) 60 ft (10' x 4 (40), 5' x 4 (20))  Mobility Response Tolerated well  Mobility performed by Mobility specialist  $Mobility charge 1 Mobility    Pt laying in bed w/ wife at bedside upon arrival. Pt agreed to session. Pt states he hasn't ambulated since admission, only ambulates to go to bathroom. Pt able to get to EOB. NT entered room to obtain VS. Pt mod. Independent t/o session. Pt required extra time for safety. Pt ambulated 5' x 4, and progressed to 10' x 4. No LOB noted. Pt denies pain and SOB. RPE is 2. Noted O2 desat to 83% during session. Pt on 15L HFNC. Educated pt on PLB to manage O2. Noted O2 sats up quickly after a rest break. Overall, pt tolerated session well. Pt is very motivated. Pt left sitting EOB w/ wife at bedside. All needs placed in reach. Nurse was notified.     Tira Lafferty Mobility Specialist  01/08/21, 11:36 AM

## 2021-01-08 NOTE — Progress Notes (Signed)
Patient switched to box [id number]. Central telemetry called. Patient and spouse educated on use of incentive spirometer and flutter valve and instructed on how and when to use as well as purpose of each device using teach back method. Patient and wife both verbalize understanding.

## 2021-01-08 NOTE — Progress Notes (Signed)
Date of Admission:  01/05/2021   *   ID: Jermaine Harris is a 60 y.o. male  Active Problems:   PNA (pneumonia)   Acute respiratory failure (HCC)    Subjective: Still having sob and needing 15 L oxyge by [place] Anxious Has headache  Medications:  . budesonide (PULMICORT) nebulizer solution  0.5 mg Nebulization BID  . citalopram  20 mg Oral Daily  . enoxaparin (LOVENOX) injection  0.5 mg/kg Subcutaneous Q24H  . gabapentin  300 mg Oral QHS  . irbesartan  150 mg Oral Daily   And  . hydrochlorothiazide  12.5 mg Oral Daily  . insulin aspart  0-5 Units Subcutaneous QHS  . insulin aspart  0-9 Units Subcutaneous TID WC  . insulin glargine  20 Units Subcutaneous Daily  . ipratropium-albuterol  3 mL Nebulization Q4H  . metFORMIN  500 mg Oral Daily  . methylPREDNISolone (SOLU-MEDROL) injection  20 mg Intravenous Q12H  . [START ON 01/09/2021] multivitamin with minerals  1 tablet Oral Daily  . potassium chloride  20 mEq Oral BID  . Ensure Max Protein  11 oz Oral BID    Objective:    Vital signs in last 24 hours: Temp:  [98.4 F (36.9 C)-99.7 F (37.6 C)] 98.4 F (36.9 C) (02/04 1106) Pulse Rate:  [60-77] 77 (02/04 1106) Resp:  [15-32] 19 (02/04 1106) BP: (112-135)/(60-93) 126/78 (02/04 1106) SpO2:  [91 %-96 %] 95 % (02/04 1106)  Patient Vitals for the past 24 hrs:  BP Temp Temp src Pulse Resp SpO2  01/08/21 1656 (!) 132/99 98.1 F (36.7 C) -- 71 19 94 %  01/08/21 1106 126/78 98.4 F (36.9 C) Oral 77 19 95 %  01/08/21 0738 128/74 99.7 F (37.6 C) Oral 66 (!) 24 96 %  01/08/21 0326 122/70 98.6 F (37 C) -- 70 19 95 %  01/08/21 0050 -- -- -- -- 18 91 %  01/08/21 0013 124/69 98.6 F (37 C) -- 74 19 91 %  01/07/21 2300 133/68 -- -- 70 (!) 28 93 %  01/07/21 2130 (!) 135/93 -- -- 76 (!) 29 93 %    PHYSICAL EXAM:  General:Awake, coughing when he talks Was doing incentive spirometry and flutter valve which made him cough  Abdomen: pain abdominal wall due to  coughing Extremities: atraumatic, no cyanosis. No edema. No clubbing Skin: No rashes or lesions. Or bruising Neurologic: Grossly non-focal  Lab Results Recent Labs    01/07/21 0447 01/08/21 0344  WBC 10.5 13.7*  HGB 10.2* 9.8*  HCT 31.5* 29.4*  NA 136 136  K 4.1 4.8  CL 103 105  CO2 20* 22  BUN 25* 37*  CREATININE 1.20 1.12   Liver Panel Recent Labs    01/07/21 0447 01/08/21 0344  PROT 6.0* 5.6*  ALBUMIN 2.5* 2.3*  AST 44* 27  ALT 57* 44  ALKPHOS 62 60  BILITOT 0.7 0.4   Microbiology: BC- NG Urine legionella antigen positive Studies/Results: CT CHEST WO CONTRAST  Result Date: 01/06/2021 CLINICAL DATA:  Respiratory failure.  Fevers for 1 week. EXAM: CT CHEST WITHOUT CONTRAST TECHNIQUE: Multidetector CT imaging of the chest was performed following the standard protocol without IV contrast. COMPARISON:  None. FINDINGS: Cardiovascular: The heart size appears normal. Aortic atherosclerosis. No pericardial effusion. Mediastinum/Nodes: No enlarged mediastinal or axillary lymph nodes. Calcified mediastinal and hilar lymph nodes are identified compatible with chronic granulomatous disease. Thyroid gland, trachea, and esophagus demonstrate no significant findings. Lungs/Pleura: Dense airspace consolidation with air bronchograms are  noted involving both lower lobes consistent with multifocal pneumonia. Right middle lobe, lingula and both upper lobes appear clear. No pleural effusion or signs of interstitial edema. Involves bilateral lower lobes with ground Upper Abdomen: Diffuse hepatic steatosis. No acute findings identified within the imaged portions of the upper abdomen. Musculoskeletal: No chest wall mass or suspicious bone lesions identified. IMPRESSION: 1. Bilateral lower lobe airspace consolidation with air bronchograms consistent with multifocal pneumonia. 2. Hepatic steatosis. 3. Aortic atherosclerosis. Aortic Atherosclerosis (ICD10-I70.0). Electronically Signed   By: Kerby Moors M.D.   On: 01/06/2021 18:58   ECHOCARDIOGRAM COMPLETE  Result Date: 01/07/2021    ECHOCARDIOGRAM REPORT   Patient Name:   Jermaine Harris Date of Exam: 01/07/2021 Medical Rec #:  [id number]      Height:       67.2 in Accession #:    [id number]     Weight:       212.0 lb Date of Birth:  1960-12-18      BSA:          2.078 m Patient Age:    15 years       BP:           124/67 mmHg Patient Gender: M              HR:           56 bpm. Exam Location:  [facility] Procedure: 2D Echo, Cardiac Doppler and Color Doppler Indications:     Acute respiratory distress R06.03  History:         Patient has no prior history of Echocardiogram examinations.                  Risk Factors:Hypertension.  Sonographer:     Sherrie Sport RDCS (AE) Referring Phys:  [id number] Awilda Bill Diagnosing Phys: Ida Rogue MD IMPRESSIONS  1. Left ventricular ejection fraction, by estimation, is 55 to 60%. The left ventricle has normal function. The left ventricle has no regional wall motion abnormalities. Left ventricular diastolic parameters are consistent with Grade II diastolic dysfunction (pseudonormalization).  2. Right ventricular systolic function is normal. The right ventricular size is normal. There is mildly elevated pulmonary artery systolic pressure. The estimated right ventricular systolic pressure is [id number] mmHg.  3. Left atrial size was mildly dilated.  4. The mitral valve is normal in structure. Mild to moderate mitral valve regurgitation. FINDINGS  Left Ventricle: Left ventricular ejection fraction, by estimation, is 55 to 60%. The left ventricle has normal function. The left ventricle has no regional wall motion abnormalities. The left ventricular internal cavity size was normal in size. There is  no left ventricular hypertrophy. Left ventricular diastolic parameters are consistent with Grade II diastolic dysfunction (pseudonormalization). Right Ventricle: The right ventricular size is normal. No increase in right ventricular wall  thickness. Right ventricular systolic function is normal. There is mildly elevated pulmonary artery systolic pressure. The tricuspid regurgitant velocity is 2.80  m/s, and with an assumed right atrial pressure of 5 mmHg, the estimated right ventricular systolic pressure is [id number] mmHg. Left Atrium: Left atrial size was mildly dilated. Right Atrium: Right atrial size was normal in size. Pericardium: There is no evidence of pericardial effusion. Mitral Valve: The mitral valve is normal in structure. Mild mitral annular calcification. Mild to moderate mitral valve regurgitation. No evidence of mitral valve stenosis. Tricuspid Valve: The tricuspid valve is normal in structure. Tricuspid valve regurgitation is not demonstrated. No evidence of tricuspid stenosis. Aortic Valve: The  aortic valve is normal in structure. Aortic valve regurgitation is not visualized. Mild aortic valve sclerosis is present, with no evidence of aortic valve stenosis. Aortic valve mean gradient measures 3.0 mmHg. Aortic valve peak gradient measures 4.4 mmHg. Aortic valve area, by VTI measures 2.76 cm. Pulmonic Valve: The pulmonic valve was normal in structure. Pulmonic valve regurgitation is not visualized. No evidence of pulmonic stenosis. Aorta: The aortic root is normal in size and structure. Venous: The inferior vena cava is normal in size with greater than 50% respiratory variability, suggesting right atrial pressure of 3 mmHg. IAS/Shunts: No atrial level shunt detected by color flow Doppler.  LEFT VENTRICLE PLAX 2D LVIDd:         5.01 cm  Diastology LVIDs:         3.32 cm  LV e' medial:    3.65 cm/s LV PW:         1.24 cm  LV E/e' medial:  24.3 LV IVS:        0.86 cm  LV e' lateral:   11.50 cm/s LVOT diam:     2.10 cm  LV E/e' lateral: 7.7 LV SV:         67 LV SV Index:   32 LVOT Area:     3.46 cm  RIGHT VENTRICLE RV Basal diam:  4.35 cm RV S prime:     11.91 cm/s TAPSE (M-mode): 4.2 cm LEFT ATRIUM             Index       RIGHT ATRIUM            Index LA diam:        4.40 cm 2.12 cm/m  RA Area:     27.00 cm LA Vol (A2C):   79.7 ml 38.35 ml/m RA Volume:   93.50 ml  44.99 ml/m LA Vol (A4C):   91.4 ml 43.98 ml/m LA Biplane Vol: 85.7 ml 41.24 ml/m  AORTIC VALVE                   PULMONIC VALVE AV Area (Vmax):    2.52 cm    PV Vmax:        0.64 m/s AV Area (Vmean):   2.52 cm    PV Peak grad:   1.6 mmHg AV Area (VTI):     2.76 cm    RVOT Peak grad: 3 mmHg AV Vmax:           105.00 cm/s AV Vmean:          79.600 cm/s AV VTI:            0.241 m AV Peak Grad:      4.4 mmHg AV Mean Grad:      3.0 mmHg LVOT Vmax:         76.40 cm/s LVOT Vmean:        57.900 cm/s LVOT VTI:          0.192 m LVOT/AV VTI ratio: 0.80  AORTA Ao Root diam: 3.43 cm MITRAL VALVE               TRICUSPID VALVE MV Area (PHT): 5.62 cm    TR Peak grad:   31.4 mmHg MV Decel Time: 135 msec    TR Vmax:        280.00 cm/s MV E velocity: 88.70 cm/s MV A velocity: 61.30 cm/s  SHUNTS MV E/A ratio:  1.45        Systemic  VTI:  0.19 m                            Systemic Diam: 2.10 cm Ida Rogue MD Electronically signed by Ida Rogue MD Signature Date/Time: 01/07/2021/3:36:44 PM    Final      Assessment/Plan: Acute respiratory illness with bilateral pneumonia secondary to Legionella Currently on azithromycin IV X day 4 with no significant improvement  Will change to levaquin 750mg   Acute hypoxic respiratory failure due to above.  On high flow oxygen Also on steroids Would recommend tapering steroids   Diabetes mellitus  Anemia  Hypoalbuminemia Claustrophobia and anxiety- PRN xanax  Discussed the management with the patient and his wife

## 2021-01-08 NOTE — Progress Notes (Signed)
Initial Nutrition Assessment  DOCUMENTATION CODES:   Obesity unspecified  INTERVENTION:   Ensure Max protein supplement BID, each supplement provides 150kcal and 30g of protein.  MVI po daily   Diabetes diet education   NUTRITION DIAGNOSIS:   Inadequate oral intake related to acute illness as evidenced by per patient/family report.  GOAL:   Patient will meet greater than or equal to 90% of their needs  MONITOR:   PO intake,Supplement acceptance,Labs,Weight trends,Skin,I & O's  REASON FOR ASSESSMENT:   Consult Diet education  ASSESSMENT:   60 y.o. male with medical history significant for hypertension, NIDDM, recent international travel to [place], hypertension, diverticulosis and anxiety/depression who is admitted with CAP.   Met with pt in room today. Pt reports decreased appetite and oral intake for several days pta but reports that his appetite is improving in hospital. Pt reports eating ~50% of meals in hospital. RD discussed with pt the importance of adequate protein intake needed to preserve lean muscle. Pt is willing to try vanilla Ensure Max in hospital. RD will add supplements and MVI to help pt meet his estimated needs. RD also provided pt with diabetes diet eduction today.  Per chart, pt is weight stable pta.   Medications reviewed and include: celexa, lovenox, insulin, metformin, solu-medrol, KCl, NaCl _0 /hr, azithromycin   Labs reviewed: K 4.8 wnl, BUN 37(H), P 3.1 wnl, Mg 2.5(H) Wbc- 13.7(H), Hgb 9.8(L), Hct 29.4(L) cbgs- 229, 233 x 24 hrs AIC 7.8(H)- 2/1  NUTRITION - FOCUSED PHYSICAL EXAM:  Flowsheet Row Most Recent Value  Orbital Region No depletion  Upper Arm Region No depletion  Thoracic and Lumbar Region No depletion  Buccal Region No depletion  Temple Region No depletion  Clavicle Bone Region No depletion  Clavicle and Acromion Bone Region No depletion  Scapular Bone Region No depletion  Dorsal Hand No depletion  Patellar Region No  depletion  Anterior Thigh Region No depletion  Posterior Calf Region No depletion  Edema (RD Assessment) None  Hair Reviewed  Eyes Reviewed  Mouth Reviewed  Skin Reviewed  Nails Reviewed     Diet Order:   Diet Order            Diet heart healthy/carb modified Room service appropriate? Yes; Fluid consistency: Thin  Diet effective now                EDUCATION NEEDS:   Education needs have been addressed  Skin:  Skin Assessment: Reviewed RN Assessment  Last BM:  2/4  Height:   Ht Readings from Last 1 Encounters:  01/05/21 5' 7.25" (1.708 m)    Weight:   Wt Readings from Last 1 Encounters:  01/05/21 96.2 kg    Ideal Body Weight:  67.27 kg  BMI:  Body mass index is 32.96 kg/m.  Estimated Nutritional Needs:   Kcal:  2000-2300kcal/day  Protein:  100-115g/day  Fluid:  >2L/day  Koleen Distance MS, RD, LDN Please refer to [facility] for RD and/or RD on-call/weekend/after hours pager

## 2021-01-08 NOTE — Progress Notes (Signed)
Mobility Specialist - Progress Note   01/08/21 1329  Mobility  Activity Ambulated in room  Level of Assistance Independent (SBA for safety)  Assistive Device None  Distance Ambulated (ft) 40 ft (10' x 4)  Mobility Response Tolerated well  Mobility performed by Mobility specialist  $Mobility charge 1 Mobility    Per pt, pt requests to have another mobility session. Returned at this time. Pt received laying in bed w/ wife at bedside. Pt agreed to session. Pt motivated. Pt independent. SBA for safety. Pt ambulated 40' total in room. No LOB noted. No c/o pain or SOB. Noted O2 desat to 87% during session. O2 sats up quickly after utilizing PLB while ambulating. Overall, pt tolerated session well. Pt left laying in bed w/ wife at bedside. All needs placed in reach.     Jenelle Drennon Mobility Specialist  01/08/21, 1:33 PM

## 2021-01-08 NOTE — Plan of Care (Signed)
Nutrition Education Note   RD consulted for nutrition education regarding diabetes.   60 y.o. male with medical history significant for hypertension, NIDDM, recent international travel to [place], hypertension, diverticulosis and anxiety/depression who is admitted with CAP.  Lab Results  Component Value Date   HGBA1C 7.8 (H) 01/05/2021    RD provided "Nutrition and Type II Diabetes" handout from the Academy of [employer]. Discussed different food groups and their effects on blood sugar, emphasizing carbohydrate-containing foods. Provided list of carbohydrates and recommended serving sizes of common foods.  Discussed importance of controlled and consistent carbohydrate intake throughout the day. Provided examples of ways to balance meals/snacks and encouraged intake of high-fiber, whole grain complex carbohydrates. Teach back method used.  Expect good compliance.  Body mass index is 32.96 kg/m. Pt meets criteria for obesity based on current BMI.  Current diet order is HH/CHO modified, patient is consuming approximately 50% of meals at this time.   RD following this patient   Koleen Distance MS, RD, LDN Please refer to [facility] for RD and/or RD on-call/weekend/after hours pager

## 2021-01-08 NOTE — Progress Notes (Signed)
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  Patient is not sure whether any of other members have the same symptoms.  Patient also reported lightheadedness and dizziness with shortness of breath. He is fully vaccinated. Infectious disease and pulmonology consulted.  His presentation is consistent with Legionella pneumonia.  Assessment & Plan:   Active Problems:   PNA (pneumonia)   Acute respiratory failure (HCC)   Acute hypoxic respiratory failure secondary to community-acquired pneumonia : Patient presents with fever, cough and bilateral infiltrate on chest x-ray. Procalcitonin  0.73 Differential diagnoses include CAP, atypical viral pneumonia. Covid, influenza and parainfluenza negative,  RSV negative. MERS Covid -  Negative Legionella mycoplasma antigen positive Continue ceftriaxone, zithromax and doxycycline for now. Continue supplemental oxygen to keep saturation above 94%. He is requiring heated high flow at 15 L sats 93%. He denies any mosquito bite, animal bite or tick bite. He denies any swimming in [place]. Infectious disease has been consulted and recommends: MERS COV testing, RESP viral PCR with 20 pathogens, SARS COV2 antibody, Blood cultures, and pulmonology consult as patient may need bronchoscopy if blood tests do not yield an answer. CT chest findings consistent with multifocal PNA. MRSA of the nares - negative. Pulmonology consulted,   Continue HHF and wean as tolerated. Continue  solumedrol and inhalers. Patient presentation is consistent with Legionella pneumonia Patient will need 7 to 10 days of either Zithromax or Levaquin.  Not insulin-dependent diabetes mellitus-resumed home Metformin - Patient stopped taking metformin on his own about 2-3 weeks ago - Insulin SSI with HS coverage started. -Diabetes coordinator and registered dietitian ordered -Insulin SSI  AKI - Improved. patient has had poor p.o. intake for the last 7 days and has been intermittently taking acetaminophen and ibuprofen for fever control. -LR IVF at 125 cc/h for 12 hours -Avoid nephrotoxic agents -BMP in the a.m.  Pneumonia Continue ceftriaxone and doxycycline.  Hypertension-resumed home antihypertensive medications.  Anxiety/depression- Continue Xanax 0.5 mg daily as needed for anxiety, citalopram 20 mg p.o. daily     DVT prophylaxis: Lovenox Code Status:  Full code. Family Communication:Spouse at bed side. Disposition Plan:   Status is: Inpatient  Remains inpatient appropriate because:Inpatient level of care appropriate due to severity of illness   Dispo: The patient is from: Home              Anticipated d/c is to: Home              Anticipated d/c date is: > 3 days              Patient currently is not medically stable to d/c.   Difficult to place patient No   Consultants:   Infectious disease  Pulmonology  Procedures:  None.  Antimicrobials: Anti-infectives (From admission, onward)   Start     Dose/Rate Route Frequency Ordered Stop   01/06/21 2000  azithromycin (ZITHROMAX) 500 mg in sodium chloride 0.9 % 250 mL IVPB  Status:  Discontinued        500 mg 250 mL/hr over 60 Minutes Intravenous Every 24  hours 01/05/21 1843 01/05/21 2216   01/06/21 1800  azithromycin (ZITHROMAX) 500 mg in sodium chloride 0.9 % 250 mL IVPB        500 mg 250 mL/hr over 60 Minutes Intravenous Every 24 hours 01/06/21 0959     01/06/21 0600  cefTRIAXone (ROCEPHIN) 1 g in sodium  chloride 0.9 % 100 mL IVPB  Status:  Discontinued        1 g 200 mL/hr over 30 Minutes Intravenous Every 24 hours 01/05/21 1843 01/05/21 2201   01/06/21 0600  cefTRIAXone (ROCEPHIN) 2 g in sodium chloride 0.9 % 100 mL IVPB  Status:  Discontinued        2 g 200 mL/hr over 30 Minutes Intravenous Every 24 hours 01/05/21 2201 01/08/21 0912   01/06/21 0600  doxycycline (VIBRAMYCIN) 100 mg in sodium chloride 0.9 % 250 mL IVPB  Status:  Discontinued        100 mg 125 mL/hr over 120 Minutes Intravenous Every 12 hours 01/05/21 2216 01/07/21 1820   01/05/21 1715  cefTRIAXone (ROCEPHIN) 1 g in sodium chloride 0.9 % 100 mL IVPB        1 g 200 mL/hr over 30 Minutes Intravenous  Once 01/05/21 1704 01/05/21 1843   01/05/21 1715  azithromycin (ZITHROMAX) 500 mg in sodium chloride 0.9 % 250 mL IVPB  Status:  Discontinued        500 mg 250 mL/hr over 60 Minutes Intravenous  Once 01/05/21 1704 01/05/21 1847      Subjective: Patient was seen and examined at bedside.  Overnight events noted.   Patient reports feeling better. He reports breathing is getting better, He is still on HHF@ 15 L,  He denies nay fever, chills, nausea and vomiting.   Objective: Vitals:   01/08/21 0050 01/08/21 0326 01/08/21 0738 01/08/21 1106  BP:  122/70 128/74 126/78  Pulse:  70 66 77  Resp: 18 19 (!) 24 19  Temp:  98.6 F (37 C) 99.7 F (37.6 C) 98.4 F (36.9 C)  TempSrc:   Oral Oral  SpO2: 91% 95% 96% 95%  Weight:      Height:        Intake/Output Summary (Last 24 hours) at 01/08/2021 1454 Last data filed at 01/08/2021 1330 Gross per 24 hour  Intake 805 ml  Output 375 ml  Net 430 ml   Filed Weights   01/05/21 1359  Weight: 96.2 kg    Examination:  General exam: Appears calm and comfortable, not in any distress. Respiratory system: Clear to auscultation. Respiratory effort normal. Cardiovascular system: S1 & S2 heard, RRR. No JVD, murmurs, rubs, gallops or clicks. No pedal edema. Gastrointestinal system:  Abdomen is nondistended, soft and nontender. No organomegaly or masses felt. Normal bowel sounds heard. Central nervous system: Alert and oriented. No focal neurological deficits. Extremities: Symmetric 5 x 5 power., No edema. No cyanosis, No clubbing Skin: No rashes, lesions or ulcers Psychiatry: Judgement and insight appear normal. Mood & affect appropriate.     Data Reviewed: I have personally reviewed following labs and imaging studies  CBC: Recent Labs  Lab 01/04/21 1306 01/05/21 1428 01/06/21 0748 01/07/21 0447 01/08/21 0344  WBC 9.4 10.4 9.0 10.5 13.7*  NEUTROABS 7,802* 8.3*  --   --   --   HGB 12.6* 11.5* 10.1* 10.2* 9.8*  HCT 38.1* 33.5* 30.0* 31.5* 29.4*  MCV 84.1 82.7 82.9 84.9 84.5  PLT 212 208 215 258 106   Basic Metabolic Panel: Recent Labs  Lab  01/04/21 1306 01/05/21 1428 01/06/21 0748 01/07/21 0447 01/08/21 0344  NA 135 134* 132* 136 136  K 3.5 3.1* 3.6 4.1 4.8  CL 99 99 98 103 105  CO2 24 23 22  20* 22  GLUCOSE 237* 235* 165* 249* 262*  BUN 24* 27* 23* 25* 37*  CREATININE 1.52* 1.48* 1.23 1.20 1.12  CALCIUM 8.3* 8.0* 7.8* 8.0* 8.1*  MG  --   --  2.1 2.6* 2.5*  PHOS  --   --   --  3.8 3.1   GFR: Estimated Creatinine Clearance: 77.9 mL/min (by C-G formula based on SCr of 1.12 mg/dL). Liver Function Tests: Recent Labs  Lab 01/04/21 1306 01/05/21 1428 01/07/21 0447 01/08/21 0344  AST 26 72* 44* 27  ALT 38 76* 57* 44  ALKPHOS 64 66 62 60  BILITOT 0.6 0.6 0.7 0.4  PROT 6.0 6.6 6.0* 5.6*  ALBUMIN 3.4* 3.0* 2.5* 2.3*   No results for input(s): LIPASE, AMYLASE in the last 168 hours. No results for input(s): AMMONIA in the last 168 hours. Coagulation Profile: No results for input(s): INR, PROTIME in the last 168 hours. Cardiac Enzymes: No results for input(s): CKTOTAL, CKMB, CKMBINDEX, TROPONINI in the last 168 hours. BNP (last 3 results) No results for input(s): PROBNP in the last 8760 hours. HbA1C: No results for input(s): HGBA1C in the  last 72 hours. CBG: Recent Labs  Lab 01/07/21 1123 01/07/21 1615 01/07/21 2111 01/08/21 0740 01/08/21 1108  GLUCAP 288* 303* 269* 229* 233*   Lipid Profile: No results for input(s): CHOL, HDL, LDLCALC, TRIG, CHOLHDL, LDLDIRECT in the last 72 hours. Thyroid Function Tests: No results for input(s): TSH, T4TOTAL, FREET4, T3FREE, THYROIDAB in the last 72 hours. Anemia Panel: No results for input(s): VITAMINB12, FOLATE, FERRITIN, TIBC, IRON, RETICCTPCT in the last 72 hours. Sepsis Labs: Recent Labs  Lab 01/05/21 1426 01/05/21 1428 01/06/21 0748 01/07/21 0447 01/08/21 0344  PROCALCITON  --  <0.10 0.73 0.71 0.57  LATICACIDVEN 1.3  --   --   --   --     Recent Results (from the past 240 hour(s))  Novel Coronavirus, NAA ([facility])     Status: None   Collection Time: 12/31/20  2:35 PM   Specimen: Nasopharyngeal(NP) swabs in vial transport medium   Nasopharynge  Result Value Ref Range Status   SARS-CoV-2, NAA Not Detected Not Detected Final    Comment: This nucleic acid amplification test was developed and its performance characteristics determined by [employer]. Nucleic acid amplification tests include RT-PCR and TMA. This test has not been FDA cleared or approved. This test has been authorized by FDA under an Emergency Use Authorization (EUA). This test is only authorized for the duration of time the declaration that circumstances exist justifying the authorization of the emergency use of in vitro diagnostic tests for detection of SARS-CoV-2 virus and/or diagnosis of COVID-19 infection under section 564(b)(1) of the Act, 21 U.S.C. 664QIH-4(V) (1), unless the authorization is terminated or revoked sooner. When diagnostic testing is negative, the possibility of a false negative result should be considered in the context of a patient's recent exposures and the presence of clinical signs and symptoms consistent with COVID-19. An individual without symptoms of  COVID-19 and who is not shedding SARS-CoV-2 virus wo uld expect to have a negative (not detected) result in this assay.   SARS-COV-2, NAA 2 DAY TAT     Status: None   Collection Time: 12/31/20  2:35 PM   Nasopharynge  Result Value Ref Range  Status   SARS-CoV-2, NAA 2 DAY TAT Performed  Final  Culture, blood (single) w Reflex to ID Panel     Status: None (Preliminary result)   Collection Time: 01/04/21  1:18 PM   Specimen: Blood  Result Value Ref Range Status   MICRO NUMBER: [id number]  Preliminary   SPECIMEN QUALITY: Adequate  Preliminary   Source NOT GIVEN  Preliminary   STATUS: PRELIMINARY  Preliminary   Result:   Preliminary    No growth to date. Culture is continuously monitored for a total of 120 hours incubation. A change in status will result in a phone report followed by an updated printed culture report.   COMMENT: Aerobic and anaerobic bottle received.  Preliminary  Culture, blood (single) w Reflex to ID Panel     Status: None (Preliminary result)   Collection Time: 01/04/21  1:18 PM   Specimen: Blood  Result Value Ref Range Status   MICRO NUMBER: [id number]  Preliminary   SPECIMEN QUALITY: Adequate  Preliminary   Source BLOOD  Preliminary   STATUS: PRELIMINARY  Preliminary   Result:   Preliminary    No growth to date. Culture is continuously monitored for a total of 120 hours incubation. A change in status will result in a phone report followed by an updated printed culture report.   COMMENT: Aerobic and anaerobic bottle received.  Preliminary  SARS Coronavirus 2 by RT PCR (hospital order, performed in [facility] hospital lab) Nasopharyngeal Nasopharyngeal Swab     Status: None   Collection Time: 01/05/21  2:32 PM   Specimen: Nasopharyngeal Swab  Result Value Ref Range Status   SARS Coronavirus 2 NEGATIVE NEGATIVE Final    Comment: (NOTE) SARS-CoV-2 target nucleic acids are NOT DETECTED.  The SARS-CoV-2 RNA is generally detectable in upper and lower respiratory  specimens during the acute phase of infection. The lowest concentration of SARS-CoV-2 viral copies this assay can detect is 250 copies / mL. A negative result does not preclude SARS-CoV-2 infection and should not be used as the sole basis for treatment or other patient management decisions.  A negative result may occur with improper specimen collection / handling, submission of specimen other than nasopharyngeal swab, presence of viral mutation(s) within the areas targeted by this assay, and inadequate number of viral copies (<250 copies / mL). A negative result must be combined with clinical observations, patient history, and epidemiological information.  Fact Sheet for Patients:   StrictlyIdeas.no  Fact Sheet for Healthcare Providers: BankingDealers.co.za  This test is not yet approved or  cleared by the [place] FDA and has been authorized for detection and/or diagnosis of SARS-CoV-2 by FDA under an Emergency Use Authorization (EUA).  This EUA will remain in effect (meaning this test can be used) for the duration of the COVID-19 declaration under Section 564(b)(1) of the Act, 21 U.S.C. section 360bbb-3(b)(1), unless the authorization is terminated or revoked sooner.  Performed at [facility], [place], [place] [address]   Respiratory (~20 pathogens) panel by PCR     Status: None   Collection Time: 01/05/21  5:14 PM   Specimen: Flu Kit Nasopharyngeal Swab; Respiratory  Result Value Ref Range Status   Adenovirus NOT DETECTED NOT DETECTED Final   Coronavirus 229E NOT DETECTED NOT DETECTED Final    Comment: (NOTE) The Coronavirus on the Respiratory Panel, DOES NOT test for the novel  Coronavirus (2019 nCoV)    Coronavirus HKU1 NOT DETECTED NOT DETECTED Final   Coronavirus NL63  NOT DETECTED NOT DETECTED Final   Coronavirus OC43 NOT DETECTED NOT DETECTED Final   Metapneumovirus NOT DETECTED NOT DETECTED  Final   Rhinovirus / Enterovirus NOT DETECTED NOT DETECTED Final   Influenza A NOT DETECTED NOT DETECTED Final   Influenza B NOT DETECTED NOT DETECTED Final   Parainfluenza Virus 1 NOT DETECTED NOT DETECTED Final   Parainfluenza Virus 2 NOT DETECTED NOT DETECTED Final   Parainfluenza Virus 3 NOT DETECTED NOT DETECTED Final   Parainfluenza Virus 4 NOT DETECTED NOT DETECTED Final   Respiratory Syncytial Virus NOT DETECTED NOT DETECTED Final   Bordetella pertussis NOT DETECTED NOT DETECTED Final   Bordetella Parapertussis NOT DETECTED NOT DETECTED Final   Chlamydophila pneumoniae NOT DETECTED NOT DETECTED Final   Mycoplasma pneumoniae NOT DETECTED NOT DETECTED Final    Comment: Performed at [facility], [place] [address], [place], [place] [address]  MRSA PCR Screening     Status: None   Collection Time: 01/06/21  4:01 AM   Specimen: Urine, Clean Catch; Nasopharyngeal  Result Value Ref Range Status   MRSA by PCR NEGATIVE NEGATIVE Final    Comment:        The GeneXpert MRSA Assay (FDA approved for NASAL specimens only), is one component of a comprehensive MRSA colonization surveillance program. It is not intended to diagnose MRSA infection nor to guide or monitor treatment for MRSA infections. Performed at [facility], [address] [place], [place] [address]     Radiology Studies: CT CHEST WO CONTRAST  Result Date: 01/06/2021 CLINICAL DATA:  Respiratory failure.  Fevers for 1 week. EXAM: CT CHEST WITHOUT CONTRAST TECHNIQUE: Multidetector CT imaging of the chest was performed following the standard protocol without IV contrast. COMPARISON:  None. FINDINGS: Cardiovascular: The heart size appears normal. Aortic atherosclerosis. No pericardial effusion. Mediastinum/Nodes: No enlarged mediastinal or axillary lymph nodes. Calcified mediastinal and hilar lymph nodes are identified compatible with chronic granulomatous disease. Thyroid gland, trachea, and esophagus demonstrate  no significant findings. Lungs/Pleura: Dense airspace consolidation with air bronchograms are noted involving both lower lobes consistent with multifocal pneumonia. Right middle lobe, lingula and both upper lobes appear clear. No pleural effusion or signs of interstitial edema. Involves bilateral lower lobes with ground Upper Abdomen: Diffuse hepatic steatosis. No acute findings identified within the imaged portions of the upper abdomen. Musculoskeletal: No chest wall mass or suspicious bone lesions identified. IMPRESSION: 1. Bilateral lower lobe airspace consolidation with air bronchograms consistent with multifocal pneumonia. 2. Hepatic steatosis. 3. Aortic atherosclerosis. Aortic Atherosclerosis (ICD10-I70.0). Electronically Signed   By: Kerby Moors M.D.   On: 01/06/2021 18:58   ECHOCARDIOGRAM COMPLETE  Result Date: 01/07/2021    ECHOCARDIOGRAM REPORT   Patient Name:   Jermaine Harris Date of Exam: 01/07/2021 Medical Rec #:  [id number]      Height:       67.2 in Accession #:    [id number]     Weight:       212.0 lb Date of Birth:  [birth year]      BSA:          2.078 m Patient Age:    52 years       BP:           124/67 mmHg Patient Gender: M              HR:           56 bpm. Exam Location:  [facility] Procedure: 2D Echo, Cardiac Doppler and Color  Doppler Indications:     Acute respiratory distress R06.03  History:         Patient has no prior history of Echocardiogram examinations.                  Risk Factors:Hypertension.  Sonographer:     Sherrie Sport RDCS (AE) Referring Phys:  [id number] Awilda Bill Diagnosing Phys: Ida Rogue MD IMPRESSIONS  1. Left ventricular ejection fraction, by estimation, is 55 to 60%. The left ventricle has normal function. The left ventricle has no regional wall motion abnormalities. Left ventricular diastolic parameters are consistent with Grade II diastolic dysfunction (pseudonormalization).  2. Right ventricular systolic function is normal. The right ventricular size is normal.  There is mildly elevated pulmonary artery systolic pressure. The estimated right ventricular systolic pressure is 80.1 mmHg.  3. Left atrial size was mildly dilated.  4. The mitral valve is normal in structure. Mild to moderate mitral valve regurgitation. FINDINGS  Left Ventricle: Left ventricular ejection fraction, by estimation, is 55 to 60%. The left ventricle has normal function. The left ventricle has no regional wall motion abnormalities. The left ventricular internal cavity size was normal in size. There is  no left ventricular hypertrophy. Left ventricular diastolic parameters are consistent with Grade II diastolic dysfunction (pseudonormalization). Right Ventricle: The right ventricular size is normal. No increase in right ventricular wall thickness. Right ventricular systolic function is normal. There is mildly elevated pulmonary artery systolic pressure. The tricuspid regurgitant velocity is 2.80  m/s, and with an assumed right atrial pressure of 5 mmHg, the estimated right ventricular systolic pressure is 65.5 mmHg. Left Atrium: Left atrial size was mildly dilated. Right Atrium: Right atrial size was normal in size. Pericardium: There is no evidence of pericardial effusion. Mitral Valve: The mitral valve is normal in structure. Mild mitral annular calcification. Mild to moderate mitral valve regurgitation. No evidence of mitral valve stenosis. Tricuspid Valve: The tricuspid valve is normal in structure. Tricuspid valve regurgitation is not demonstrated. No evidence of tricuspid stenosis. Aortic Valve: The aortic valve is normal in structure. Aortic valve regurgitation is not visualized. Mild aortic valve sclerosis is present, with no evidence of aortic valve stenosis. Aortic valve mean gradient measures 3.0 mmHg. Aortic valve peak gradient measures 4.4 mmHg. Aortic valve area, by VTI measures 2.76 cm. Pulmonic Valve: The pulmonic valve was normal in structure. Pulmonic valve regurgitation is not  visualized. No evidence of pulmonic stenosis. Aorta: The aortic root is normal in size and structure. Venous: The inferior vena cava is normal in size with greater than 50% respiratory variability, suggesting right atrial pressure of 3 mmHg. IAS/Shunts: No atrial level shunt detected by color flow Doppler.  LEFT VENTRICLE PLAX 2D LVIDd:         5.01 cm  Diastology LVIDs:         3.32 cm  LV e' medial:    3.65 cm/s LV PW:         1.24 cm  LV E/e' medial:  24.3 LV IVS:        0.86 cm  LV e' lateral:   11.50 cm/s LVOT diam:     2.10 cm  LV E/e' lateral: 7.7 LV SV:         67 LV SV Index:   32 LVOT Area:     3.46 cm  RIGHT VENTRICLE RV Basal diam:  4.35 cm RV S prime:     11.91 cm/s TAPSE (M-mode): 4.2 cm LEFT ATRIUM  Index       RIGHT ATRIUM           Index LA diam:        4.40 cm 2.12 cm/m  RA Area:     27.00 cm LA Vol (A2C):   79.7 ml 38.35 ml/m RA Volume:   93.50 ml  44.99 ml/m LA Vol (A4C):   91.4 ml 43.98 ml/m LA Biplane Vol: 85.7 ml 41.24 ml/m  AORTIC VALVE                   PULMONIC VALVE AV Area (Vmax):    2.52 cm    PV Vmax:        0.64 m/s AV Area (Vmean):   2.52 cm    PV Peak grad:   1.6 mmHg AV Area (VTI):     2.76 cm    RVOT Peak grad: 3 mmHg AV Vmax:           105.00 cm/s AV Vmean:          79.600 cm/s AV VTI:            0.241 m AV Peak Grad:      4.4 mmHg AV Mean Grad:      3.0 mmHg LVOT Vmax:         76.40 cm/s LVOT Vmean:        57.900 cm/s LVOT VTI:          0.192 m LVOT/AV VTI ratio: 0.80  AORTA Ao Root diam: 3.43 cm MITRAL VALVE               TRICUSPID VALVE MV Area (PHT): 5.62 cm    TR Peak grad:   31.4 mmHg MV Decel Time: 135 msec    TR Vmax:        280.00 cm/s MV E velocity: 88.70 cm/s MV A velocity: 61.30 cm/s  SHUNTS MV E/A ratio:  1.45        Systemic VTI:  0.19 m                            Systemic Diam: 2.10 cm Ida Rogue MD Electronically signed by Ida Rogue MD Signature Date/Time: 01/07/2021/3:36:44 PM    Final    Scheduled Meds: . budesonide (PULMICORT)  nebulizer solution  0.5 mg Nebulization BID  . citalopram  20 mg Oral Daily  . enoxaparin (LOVENOX) injection  0.5 mg/kg Subcutaneous Q24H  . gabapentin  300 mg Oral QHS  . irbesartan  150 mg Oral Daily   And  . hydrochlorothiazide  12.5 mg Oral Daily  . insulin aspart  0-5 Units Subcutaneous QHS  . insulin aspart  0-9 Units Subcutaneous TID WC  . insulin glargine  20 Units Subcutaneous Daily  . ipratropium-albuterol  3 mL Nebulization Q4H  . metFORMIN  500 mg Oral Daily  . methylPREDNISolone (SOLU-MEDROL) injection  20 mg Intravenous Q12H  . [START ON 01/09/2021] multivitamin with minerals  1 tablet Oral Daily  . potassium chloride  20 mEq Oral BID  . Ensure Max Protein  11 oz Oral BID   Continuous Infusions: . sodium chloride 100 mL/hr at 01/08/21 1352  . azithromycin Stopped (01/07/21 1834)     LOS: 2 days    Time spent: 25 mins.    Shawna Clamp, MD [facility]   If 7PM-7AM, please contact night-coverage

## 2021-01-08 NOTE — Progress Notes (Signed)
Inpatient Diabetes Program Recommendations  AACE/ADA: New Consensus Statement on Inpatient Glycemic Control (2015)  Target Ranges:  Prepandial:   less than 140 mg/dL      Peak postprandial:   less than 180 mg/dL (1-2 hours)      Critically ill patients:  140 - 180 mg/dL   Lab Results  Component Value Date   GLUCAP 229 (H) 01/08/2021   HGBA1C 7.8 (H) 01/05/2021    Review of Glycemic Control Results for Jermaine Harris, Jermaine Harris (MRN [id number]) as of 01/08/2021 10:15  Ref. Range 01/07/2021 07:30 01/07/2021 11:23 01/07/2021 16:15 01/07/2021 21:11 01/08/2021 07:40  Glucose-Capillary Latest Ref Range: 70 - 99 mg/dL 246 (H) 288 (H) 303 (H) 269 (H) 229 (H)   History: DM  Home DM Meds: Metformin 500 mg Daily (stopepd taking about 2-3 weeks ago)  Current Orders: Novolog Sensitive Correction Scale/ SSI (0-9 units) TID AC + HS                            Metformin 500 mg Daily  Inpatient Diabetes Program Recommendations:   Consider Lantus 20 while in the hospital on steroids. Secure chat sent to Dr. Dwyane Dee.  Spoke with wife via phone regarding diabetes management. Requests followup education with patient and wife together on Tuesday @ 10 am. Wife states patient will be feeling better and able to comprehend the information better. Ordered Living Well With Diabetes and reviewed A1c of 7.8 (average blood glucose 177 over the past 2-3 months). Sent consult to dietician to schedule time to meet with patient and wife on Monday.  Thank you, Jermaine Harris. Jermaine Luiz, RN, MSN, CDE  Diabetes Coordinator Inpatient Glycemic Control Team Team Pager [phone] (8am-5pm) 01/08/2021 10:28 AM

## 2021-01-09 DIAGNOSIS — J9601 Acute respiratory failure with hypoxia: Secondary | ICD-10-CM | POA: Diagnosis not present

## 2021-01-09 LAB — CBC
HCT: 28.4 % — ABNORMAL LOW (ref 39.0–52.0)
Hemoglobin: 9.2 g/dL — ABNORMAL LOW (ref 13.0–17.0)
MCH: 27.5 pg (ref 26.0–34.0)
MCHC: 32.4 g/dL (ref 30.0–36.0)
MCV: 85 fL (ref 80.0–100.0)
Platelets: 354 10*3/uL (ref 150–400)
RBC: 3.34 MIL/uL — ABNORMAL LOW (ref 4.22–5.81)
RDW: 13.6 % (ref 11.5–15.5)
WBC: 9.7 10*3/uL (ref 4.0–10.5)
nRBC: 0 % (ref 0.0–0.2)

## 2021-01-09 LAB — COMPREHENSIVE METABOLIC PANEL
ALT: 63 U/L — ABNORMAL HIGH (ref 0–44)
AST: 48 U/L — ABNORMAL HIGH (ref 15–41)
Albumin: 2.4 g/dL — ABNORMAL LOW (ref 3.5–5.0)
Alkaline Phosphatase: 52 U/L (ref 38–126)
Anion gap: 8 (ref 5–15)
BUN: 30 mg/dL — ABNORMAL HIGH (ref 6–20)
CO2: 21 mmol/L — ABNORMAL LOW (ref 22–32)
Calcium: 7.7 mg/dL — ABNORMAL LOW (ref 8.9–10.3)
Chloride: 106 mmol/L (ref 98–111)
Creatinine, Ser: 0.91 mg/dL (ref 0.61–1.24)
GFR, Estimated: >60 mL/min (ref >60)
Glucose, Bld: 192 mg/dL — ABNORMAL HIGH (ref 70–99)
Potassium: 4.2 mmol/L (ref 3.5–5.1)
Sodium: 135 mmol/L (ref 135–145)
Total Bilirubin: 0.6 mg/dL (ref 0.3–1.2)
Total Protein: 5.6 g/dL — ABNORMAL LOW (ref 6.5–8.1)

## 2021-01-09 LAB — GLUCOSE, CAPILLARY
Glucose-Capillary: 136 mg/dL — ABNORMAL HIGH (ref 70–99)
Glucose-Capillary: 142 mg/dL — ABNORMAL HIGH (ref 70–99)
Glucose-Capillary: 110 mg/dL — ABNORMAL HIGH (ref 70–99)
Glucose-Capillary: 139 mg/dL — ABNORMAL HIGH (ref 70–99)

## 2021-01-09 LAB — PROCALCITONIN: Procalcitonin: 0.24 ng/mL

## 2021-01-09 LAB — PHOSPHORUS: Phosphorus: 3.3 mg/dL (ref 2.5–4.6)

## 2021-01-09 LAB — MAGNESIUM: Magnesium: 2 mg/dL (ref 1.7–2.4)

## 2021-01-09 MED ORDER — IPRATROPIUM-ALBUTEROL 0.5-2.5 (3) MG/3ML IN SOLN
3.0000 mL | Freq: Four times a day (QID) | RESPIRATORY_TRACT | Status: DC
  Administered 2021-01-09: 3 mL via RESPIRATORY_TRACT
  Filled 2021-01-09 (×2): qty 3

## 2021-01-09 MED ORDER — ZOLPIDEM TARTRATE 5 MG PO TABS: 5.0000 mg | ORAL_TABLET | Freq: Every evening | ORAL | Status: DC | PRN

## 2021-01-09 MED ORDER — ACETAMINOPHEN 325 MG PO TABS
650.0000 mg | ORAL_TABLET | Freq: Four times a day (QID) | ORAL | Status: DC | PRN
  Administered 2021-01-09 – 2021-01-10 (×3): 650 mg via ORAL
  Filled 2021-01-09 (×3): qty 2

## 2021-01-09 MED ORDER — LORAZEPAM 2 MG/ML IJ SOLN
1.0000 mg | Freq: Once | INTRAMUSCULAR | Status: AC
  Administered 2021-01-09: 1 mg via INTRAVENOUS
  Filled 2021-01-09: qty 1

## 2021-01-09 MED ORDER — IPRATROPIUM-ALBUTEROL 0.5-2.5 (3) MG/3ML IN SOLN
3.0000 mL | Freq: Three times a day (TID) | RESPIRATORY_TRACT | Status: DC
  Administered 2021-01-09 – 2021-01-13 (×13): 3 mL via RESPIRATORY_TRACT
  Filled 2021-01-09 (×13): qty 3

## 2021-01-09 MED ORDER — TRAZODONE HCL 50 MG PO TABS
50.0000 mg | ORAL_TABLET | Freq: Every evening | ORAL | Status: DC | PRN
  Administered 2021-01-09 – 2021-01-14 (×6): 50 mg via ORAL
  Filled 2021-01-09 (×6): qty 1

## 2021-01-09 NOTE — Plan of Care (Signed)
  Problem: Clinical Measurements: Goal: Ability to maintain a body temperature in the normal range will improve Outcome: Progressing Afebrile this shift   Problem: Respiratory: Goal: Ability to maintain adequate ventilation will improve Outcome: Not Progressing  Remains on High flow nasal cannula Dyspneic at rest and increases with minimal activity.

## 2021-01-09 NOTE — Progress Notes (Signed)
Re-educated patient and his wife on the use of Incentive Spirometry and Flutter Valve therapies. Patient and wife with adequate understanding of need for therapy and proper techniques. Patient appears anxious. SpO2 is currently 95% on 15L HFNC. Will continue to monitor.

## 2021-01-09 NOTE — Progress Notes (Signed)
OT Cancellation Note  Patient Details Name: Jermaine Harris MRN: [id number] DOB: [birth year]   Cancelled Treatment:    Reason Eval/Treat Not Completed: Fatigue/lethargy limiting ability to participate  Pt with sign on the door indicating "Do not disturb until after 11:30AM". Nursing notes indicate pt with insomnia difficulties overnight. Will f/u for OT evaluation at later date/time as able/pt agreeable. Thank you.  Gerrianne Scale, [place], OTR/L ascom [phone] 01/09/21, 9:57 AM

## 2021-01-09 NOTE — Evaluation (Signed)
Physical Therapy Evaluation Patient Details Name: Jermaine Harris MRN: [id number] DOB: December 11, 1960 Today's Date: 01/09/2021   History of Present Illness  60 y.o. male with a history of HTN, DM presents with fever of 1 weeks duration cough and worsening sob of 2 days after traveling from [place].  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 Left on 1/13 returned to the [place] on 12/26/20. He was found to be acutely hypoxemic in respiratory distress requiring HFNC.  Clinical Impression  Pt sleeping when PT attempted to see him earlier this afternoon, however after breathing treatment he was awake and willing to participate (though clearly sleepy and had gotten some meds to assist with sleep).  He did ultimately do quite well with PT exam; walking ~100 ft w/o AD on 15 with sats remaining in the 90s (generally mid/high 90s) and HR staying below 100.  He did have slow, guarded, cautious gait with a few stagger steps but no LOBs or overt safety issues.  He likely will not need PT/AD when medically ready for d/c, however would likely benefit from a Cardio-Pulm (aka [facility]) type of program.      Follow Up Recommendations No PT follow up (would benefit from [employer] program)    Equipment Recommendations  None recommended by PT (O2 per medicine/pulm recs)    Recommendations for Other Services       Precautions / Restrictions Precautions Precautions: None (low fall risk) Restrictions Weight Bearing Restrictions: No      Mobility  Bed Mobility Overal bed mobility: Modified Independent             General bed mobility comments: slow to get to sitting, but able to do so w/o direct assist    Transfers Overall transfer level: Modified independent Equipment used: None             General transfer comment: Pt was able to rise to standing w/o direct assist and with good safety/confidence  Ambulation/Gait Ambulation/Gait assistance: Min  guard Gait Distance (Feet): 100 Feet Assistive device: None       General Gait Details: Pt with slow, guarded gait and a few small stagger steps that he easily self arrested. He was on 15L t/o the effort with sats remaining in the mid/high 90s nearly the entire time (occasionally briefly into the lower 90s), HR also stayed appropriate never breaching 100bpm and generally in the 80-90 range.  Stairs            Wheelchair Mobility    Modified Rankin (Stroke Patients Only)       Balance Overall balance assessment: Modified Independent;Mild deficits observed, not formally tested                                           Pertinent Vitals/Pain Pain Assessment: No/denies pain    Home Living Family/patient expects to be discharged to:: Private residence Living Arrangements: Spouse/significant other Available Help at Discharge: Family (wife works but can take leave for 24/7 support)   Home Access: Stairs to enter Entrance Stairs-Rails:  (no rail for first step, one rail after the landing) [employer] of Steps: 1+4          Prior Function Level of Independence: Independent         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[employer], 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, generally active  Hand Dominance        Extremity/Trunk Assessment   Upper Extremity Assessment Upper Extremity Assessment: Overall WFL for tasks assessed    Lower Extremity Assessment Lower Extremity Assessment: Overall WFL for tasks assessed (chronic L foot neuropathy)       Communication   Communication: No difficulties  Cognition Arousal/Alertness: Suspect due to medications;Lethargic Behavior During Therapy: WFL for tasks assessed/performed Overall Cognitive Status: Within Functional Limits for tasks assessed                                 General Comments: has not slept well, additionally meds to help, eyes closed much of session      General Comments       Exercises     Assessment/Plan    PT Assessment Patient needs continued PT services  PT Problem List Decreased strength;Decreased activity tolerance;Decreased balance;Decreased mobility;Decreased safety awareness;Cardiopulmonary status limiting activity       PT Treatment Interventions Gait training;Stair training;Functional mobility training;Therapeutic activities;Therapeutic exercise;Balance training;Neuromuscular re-education;Patient/family education    PT Goals (Current goals can be found in the Care Plan section)  Acute Rehab PT Goals Patient Stated Goal: sleep; get breathing better PT Goal Formulation: With patient Time For Goal Achievement: 01/23/21 Potential to Achieve Goals: Good    Frequency Min 2X/week   Barriers to discharge        Co-evaluation               AM-PAC PT "6 Clicks" Mobility  Outcome Measure Help needed turning from your back to your side while in a flat bed without using bedrails?: None Help needed moving from lying on your back to sitting on the side of a flat bed without using bedrails?: None Help needed moving to and from a bed to a chair (including a wheelchair)?: None Help needed standing up from a chair using your arms (e.g., wheelchair or bedside chair)?: None Help needed to walk in hospital room?: A Little Help needed climbing 3-5 steps with a railing? : A Little 6 Click Score: 22    End of Session Equipment Utilized During Treatment: Gait belt;Oxygen (15L, pt on 13 on arrival and post session) Activity Tolerance: Patient tolerated treatment well;Patient limited by fatigue;Patient limited by lethargy Patient left: in bed;with call bell/phone within reach;with family/visitor present Nurse Communication: Mobility status (O2 during ambulation) PT Visit Diagnosis: Muscle weakness (generalized) (M62.81);Difficulty in walking, not elsewhere classified (R26.2)    Time: 7035-0093 PT Time Calculation (min) (ACUTE ONLY): 40  min   Charges:   PT Evaluation $PT Eval Low Complexity: 1 Low PT Treatments $Gait Training: 8-22 mins        Kreg Shropshire, DPT 01/09/2021, 3:24 PM

## 2021-01-09 NOTE — Progress Notes (Signed)
PT Cancellation Note  Patient Details Name: Jermaine Harris MRN: [id number] DOB: 1961-07-01   Cancelled Treatment:    Reason Eval/Treat Not Completed: Other (comment) Pt has "no not disturb" sign on door, will attempt this afternoon as time allows and pt is amenable.    Kreg Shropshire, DPT 01/09/2021, 10:33 AM

## 2021-01-09 NOTE — Progress Notes (Signed)
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   He left for [place] on 12/17/20 and reaced there eon 12/18/20 , stayed in [place] for a week, mostly had meeting in the hotel and visited certain places like [employer] . He spent a day in [place] and returned to the [place] on 12/26/20.He was found to be acutely hypoxemic in respiratory distress requiring HFNC. During my interview he has been weaned to [place] at 15L/min  01/09/2021- patient is tired today, he reports inability to sleep overnight.  Discussed with Dr Dwyane Dee today and we agreed on stopping steroids today.  I have encouraged him to attempt PT and OT again today.  He is weaned to 13L/min.  Bloodwork stable with resolution of leukocytosis and trending down procalcitonin.   PAST MEDICAL HISTORY   Past Medical History:  Diagnosis Date  . Actinic keratosis   . Claustrophobia   . GAD (generalized anxiety disorder)    claustrophobia, some panic attacks  . HTN (hypertension)   . Irritable bowel syndrome   . Lumbar herniated disc 06/2015  . Seasonal allergies      SURGICAL HISTORY   Past Surgical History:  Procedure Laterality Date  . COLON SURGERY    . COLONOSCOPY  2012   mild diverticulosis, rec rpt 5 yrs 2/2 fmhx polyps  . COLONOSCOPY WITH PROPOFOL N/A 05/29/2019   TA, diverticulosis, rpt 5 yrs (Bonna Gains, Lennette Bihari, MD)  . MICRODISCECTOMY LUMBAR  07/2015   L4/5/S1 Trenton Gammon)  . TONSILLECTOMY       FAMILY HISTORY   Family  History  Problem Relation Age of Onset  . Heart disease Father        pacer and CHF  . Depression Father   . Transient ischemic attack Mother 85       several  . Depression Mother   . Colon polyps Sister   . Sudden death Maternal Grandfather 24       ?mustard gas     SOCIAL HISTORY   Social History   Tobacco Use  . Smoking status: Never Smoker  . Smokeless tobacco: Never Used  Vaping Use  . Vaping Use: Never used  Substance Use Topics  . Alcohol use: Yes    Alcohol/week: 0.0 standard drinks    Comment: couple of drinks per week  . Drug use: Never     MEDICATIONS    Home Medication:    Current Medication:  Current Facility-Administered Medications:  .  acidophilus (RISAQUAD) capsule 1 capsule, 1 capsule, Oral, Daily, Ravishankar, Jayashree, MD, 1 capsule at 01/09/21 0832 .  albuterol (VENTOLIN HFA) 108 (90 Base) MCG/ACT inhaler 2 puff, 2 puff, Inhalation, Q6H PRN, Shawna Clamp, MD .  ALPRAZolam Duanne Moron) tablet 0.5 mg, 0.5 mg, Oral, Daily PRN, Cox, Amy N, DO, 0.5 mg at 01/09/21 1236 .  citalopram (CELEXA) tablet 20 mg, 20 mg, Oral,  Daily, Cox, Amy N, DO, 20 mg at 01/09/21 [id number] .  enoxaparin (LOVENOX) injection 47.5 mg, 0.5 mg/kg, Subcutaneous, Q24H, Cox, Amy N, DO, 47.5 mg at 01/08/21 2208 .  fentaNYL (SUBLIMAZE) injection 25 mcg, 25 mcg, Intravenous, Q4H PRN, Cox, Amy N, DO, 25 mcg at 01/06/21 0630 .  gabapentin (NEURONTIN) capsule 300 mg, 300 mg, Oral, QHS, Cox, Amy N, DO, 300 mg at 01/08/21 2129 .  irbesartan (AVAPRO) tablet 150 mg, 150 mg, Oral, Daily, 150 mg at 01/09/21 [id number] **AND** hydrochlorothiazide (MICROZIDE) capsule 12.5 mg, 12.5 mg, Oral, Daily, Cox, Amy N, DO, 12.5 mg at 01/09/21 [id number] .  insulin aspart (novoLOG) injection 0-5 Units, 0-5 Units, Subcutaneous, QHS, Cox, Amy N, DO, 3 Units at 01/07/21 2123 .  insulin aspart (novoLOG) injection 0-9 Units, 0-9 Units, Subcutaneous, TID WC, Cox, Amy N, DO, 1 Units at 01/09/21 1228 .  insulin glargine (LANTUS)  injection 20 Units, 20 Units, Subcutaneous, Daily, Shawna Clamp, MD, 20 Units at 01/09/21 0831 .  ipratropium-albuterol (DUONEB) 0.5-2.5 (3) MG/3ML nebulizer solution 3 mL, 3 mL, Nebulization, TID, Shawna Clamp, MD, 3 mL at 01/09/21 1352 .  levofloxacin (LEVAQUIN) IVPB 750 mg, 750 mg, Intravenous, Q24H, Tsosie Billing, MD, Stopped at 01/08/21 2007 .  metFORMIN (GLUCOPHAGE-XR) 24 hr tablet 500 mg, 500 mg, Oral, Daily, Cox, Amy N, DO, 500 mg at 01/09/21 [id number] .  methylPREDNISolone (MEDROL DOSEPAK) tablet 4 mg, 4 mg, Oral, 3 x daily with food, Ottie Glazier, MD, 4 mg at 01/09/21 [id number] .  [START ON 01/10/2021] methylPREDNISolone (MEDROL DOSEPAK) tablet 4 mg, 4 mg, Oral, 4X daily taper, Lanney Gins, Freemon Binford, MD .  methylPREDNISolone (MEDROL DOSEPAK) tablet 8 mg, 8 mg, Oral, Nightly, Tailey Top, MD .  metoprolol tartrate (LOPRESSOR) injection 5 mg, 5 mg, Intravenous, Q4H PRN, Cox, Amy N, DO .  morphine 2 MG/ML injection 2 mg, 2 mg, Intravenous, Q2H PRN, Flora Lipps, MD, 2 mg at 01/09/21 1236 .  multivitamin with minerals tablet 1 tablet, 1 tablet, Oral, Daily, Shawna Clamp, MD, 1 tablet at 01/09/21 [phone] .  ondansetron (ZOFRAN) tablet 4 mg, 4 mg, Oral, Q6H PRN **OR** ondansetron (ZOFRAN) injection 4 mg, 4 mg, Intravenous, Q6H PRN, Cox, Amy N, DO .  potassium chloride SA (KLOR-CON) CR tablet 20 mEq, 20 mEq, Oral, BID, Cox, Amy N, DO, 20 mEq at 01/09/21 [id number] .  protein supplement (ENSURE MAX) liquid, 11 oz, Oral, BID, Shawna Clamp, MD, 11 oz at 01/08/21 1833 .  sodium chloride (OCEAN) 0.65 % nasal spray 2 spray, 2 spray, Nasal, PRN, Tsosie Billing, MD, 2 spray at 01/09/21 0204    ALLERGIES   Ace inhibitors     REVIEW OF SYSTEMS    Review of Systems:  Gen:  Denies  fever, sweats, chills weigh loss  HEENT: Denies blurred vision, double vision, ear pain, eye pain, hearing loss, nose bleeds, sore throat Cardiac:  No dizziness, chest pain or heaviness, chest  tightness,edema Resp:   Reports severe dyspnea Gi: Denies swallowing difficulty, stomach pain, nausea or vomiting, diarrhea, constipation, bowel incontinence Gu:  Denies bladder incontinence, burning urine Ext:   Denies Joint pain, stiffness or swelling Skin: Denies  skin rash, easy bruising or bleeding or hives Endoc:  Denies polyuria, polydipsia , polyphagia or weight change Psych:   Denies depression, insomnia or hallucinations   Other:  All other systems negative   VS: BP (!) 149/77 (BP Location: Left Arm)   Pulse 66   Temp 97.7 F (36.5 C) (Oral)   Resp (!) 24  Ht 5' 7.25" (1.708 m)   Wt 96.2 kg   SpO2 93%   BMI 32.96 kg/m      PHYSICAL EXAM    GENERAL:NAD, no fevers, chills, no weakness no fatigue HEAD: Normocephalic, atraumatic.  EYES: Pupils equal, round, reactive to light. Extraocular muscles intact. No scleral icterus.  MOUTH: Moist mucosal membrane. Dentition intact. No abscess noted.  EAR, NOSE, THROAT: Clear without exudates. No external lesions.  NECK: Supple. No thyromegaly. No nodules. No JVD.  PULMONARY: decreased air entry bilaterally  CARDIOVASCULAR: S1 and S2. Regular rate and rhythm. No murmurs, rubs, or gallops. No edema. Pedal pulses 2+ bilaterally.  GASTROINTESTINAL: Soft, nontender, nondistended. No masses. Positive bowel sounds. No hepatosplenomegaly.  MUSCULOSKELETAL: No swelling, clubbing, or edema. Range of motion full in all extremities.  NEUROLOGIC: Cranial nerves II through XII are intact. No gross focal neurological deficits. Sensation intact. Reflexes intact.  SKIN: No ulceration, lesions, rashes, or cyanosis. Skin warm and dry. Turgor intact.  PSYCHIATRIC: Mood, affect within normal limits. The patient is awake, alert and oriented x 3. Insight, judgment intact.       IMAGING    DG Chest 2 View  Result Date: 01/05/2021 CLINICAL DATA:  Question normal sepsis.  Fever EXAM: CHEST - 2 VIEW COMPARISON:  01/04/2021 FINDINGS: Right lower  lobe infiltrate posteriorly unchanged from the prior study. No significant pleural effusion. Mild left lower lobe airspace disease unchanged. Negative for heart failure IMPRESSION: Bibasilar infiltrates right greater than left unchanged. Electronically Signed   By: Franchot Gallo M.D.   On: 01/05/2021 15:14   DG Chest 2 View  Result Date: 01/05/2021 CLINICAL DATA:  Dry cough, fever for 7 8 days EXAM: CHEST - 2 VIEW COMPARISON:  None. FINDINGS: The heart size and mediastinal contours are within normal limits. Heterogeneous airspace opacity of the dependent right lower lobe. The visualized skeletal structures are unremarkable. IMPRESSION: Heterogeneous airspace opacity of the dependent right lower lobe, consistent with infection or aspiration. Recommend follow-up radiographs in 6-8 weeks to ensure complete radiographic resolution and exclude underlying malignancy. Electronically Signed   By: Eddie Candle M.D.   On: 01/05/2021 08:19   CT CHEST WO CONTRAST  Result Date: 01/06/2021 CLINICAL DATA:  Respiratory failure.  Fevers for 1 week. EXAM: CT CHEST WITHOUT CONTRAST TECHNIQUE: Multidetector CT imaging of the chest was performed following the standard protocol without IV contrast. COMPARISON:  None. FINDINGS: Cardiovascular: The heart size appears normal. Aortic atherosclerosis. No pericardial effusion. Mediastinum/Nodes: No enlarged mediastinal or axillary lymph nodes. Calcified mediastinal and hilar lymph nodes are identified compatible with chronic granulomatous disease. Thyroid gland, trachea, and esophagus demonstrate no significant findings. Lungs/Pleura: Dense airspace consolidation with air bronchograms are noted involving both lower lobes consistent with multifocal pneumonia. Right middle lobe, lingula and both upper lobes appear clear. No pleural effusion or signs of interstitial edema. Involves bilateral lower lobes with ground Upper Abdomen: Diffuse hepatic steatosis. No acute findings identified  within the imaged portions of the upper abdomen. Musculoskeletal: No chest wall mass or suspicious bone lesions identified. IMPRESSION: 1. Bilateral lower lobe airspace consolidation with air bronchograms consistent with multifocal pneumonia. 2. Hepatic steatosis. 3. Aortic atherosclerosis. Aortic Atherosclerosis (ICD10-I70.0). Electronically Signed   By: Kerby Moors M.D.   On: 01/06/2021 18:58   ECHOCARDIOGRAM COMPLETE  Result Date: 01/07/2021    ECHOCARDIOGRAM REPORT   Patient Name:   Jermaine Harris Date of Exam: 01/07/2021 Medical Rec #:  [id number]      Height:  67.2 in Accession #:    [id number]     Weight:       212.0 lb Date of Birth:  1961-05-29      BSA:          2.078 m Patient Age:    9 years       BP:           124/67 mmHg Patient Gender: M              HR:           56 bpm. Exam Location:  [facility] Procedure: 2D Echo, Cardiac Doppler and Color Doppler Indications:     Acute respiratory distress R06.03  History:         Patient has no prior history of Echocardiogram examinations.                  Risk Factors:Hypertension.  Sonographer:     Sherrie Sport RDCS (AE) Referring Phys:  [id number] Awilda Bill Diagnosing Phys: Ida Rogue MD IMPRESSIONS  1. Left ventricular ejection fraction, by estimation, is 55 to 60%. The left ventricle has normal function. The left ventricle has no regional wall motion abnormalities. Left ventricular diastolic parameters are consistent with Grade II diastolic dysfunction (pseudonormalization).  2. Right ventricular systolic function is normal. The right ventricular size is normal. There is mildly elevated pulmonary artery systolic pressure. The estimated right ventricular systolic pressure is 96.7 mmHg.  3. Left atrial size was mildly dilated.  4. The mitral valve is normal in structure. Mild to moderate mitral valve regurgitation. FINDINGS  Left Ventricle: Left ventricular ejection fraction, by estimation, is 55 to 60%. The left ventricle has normal function. The  left ventricle has no regional wall motion abnormalities. The left ventricular internal cavity size was normal in size. There is  no left ventricular hypertrophy. Left ventricular diastolic parameters are consistent with Grade II diastolic dysfunction (pseudonormalization). Right Ventricle: The right ventricular size is normal. No increase in right ventricular wall thickness. Right ventricular systolic function is normal. There is mildly elevated pulmonary artery systolic pressure. The tricuspid regurgitant velocity is 2.80  m/s, and with an assumed right atrial pressure of 5 mmHg, the estimated right ventricular systolic pressure is 89.3 mmHg. Left Atrium: Left atrial size was mildly dilated. Right Atrium: Right atrial size was normal in size. Pericardium: There is no evidence of pericardial effusion. Mitral Valve: The mitral valve is normal in structure. Mild mitral annular calcification. Mild to moderate mitral valve regurgitation. No evidence of mitral valve stenosis. Tricuspid Valve: The tricuspid valve is normal in structure. Tricuspid valve regurgitation is not demonstrated. No evidence of tricuspid stenosis. Aortic Valve: The aortic valve is normal in structure. Aortic valve regurgitation is not visualized. Mild aortic valve sclerosis is present, with no evidence of aortic valve stenosis. Aortic valve mean gradient measures 3.0 mmHg. Aortic valve peak gradient measures 4.4 mmHg. Aortic valve area, by VTI measures 2.76 cm. Pulmonic Valve: The pulmonic valve was normal in structure. Pulmonic valve regurgitation is not visualized. No evidence of pulmonic stenosis. Aorta: The aortic root is normal in size and structure. Venous: The inferior vena cava is normal in size with greater than 50% respiratory variability, suggesting right atrial pressure of 3 mmHg. IAS/Shunts: No atrial level shunt detected by color flow Doppler.  LEFT VENTRICLE PLAX 2D LVIDd:         5.01 cm  Diastology LVIDs:         3.32 cm  LV  e'  medial:    3.65 cm/s LV PW:         1.24 cm  LV E/e' medial:  24.3 LV IVS:        0.86 cm  LV e' lateral:   11.50 cm/s LVOT diam:     2.10 cm  LV E/e' lateral: 7.7 LV SV:         67 LV SV Index:   32 LVOT Area:     3.46 cm  RIGHT VENTRICLE RV Basal diam:  4.35 cm RV S prime:     11.91 cm/s TAPSE (M-mode): 4.2 cm LEFT ATRIUM             Index       RIGHT ATRIUM           Index LA diam:        4.40 cm 2.12 cm/m  RA Area:     27.00 cm LA Vol (A2C):   79.7 ml 38.35 ml/m RA Volume:   93.50 ml  44.99 ml/m LA Vol (A4C):   91.4 ml 43.98 ml/m LA Biplane Vol: 85.7 ml 41.24 ml/m  AORTIC VALVE                   PULMONIC VALVE AV Area (Vmax):    2.52 cm    PV Vmax:        0.64 m/s AV Area (Vmean):   2.52 cm    PV Peak grad:   1.6 mmHg AV Area (VTI):     2.76 cm    RVOT Peak grad: 3 mmHg AV Vmax:           105.00 cm/s AV Vmean:          79.600 cm/s AV VTI:            0.241 m AV Peak Grad:      4.4 mmHg AV Mean Grad:      3.0 mmHg LVOT Vmax:         76.40 cm/s LVOT Vmean:        57.900 cm/s LVOT VTI:          0.192 m LVOT/AV VTI ratio: 0.80  AORTA Ao Root diam: 3.43 cm MITRAL VALVE               TRICUSPID VALVE MV Area (PHT): 5.62 cm    TR Peak grad:   31.4 mmHg MV Decel Time: 135 msec    TR Vmax:        280.00 cm/s MV E velocity: 88.70 cm/s MV A velocity: 61.30 cm/s  SHUNTS MV E/A ratio:  1.45        Systemic VTI:  0.19 m                            Systemic Diam: 2.10 cm Ida Rogue MD Electronically signed by Ida Rogue MD Signature Date/Time: 01/07/2021/3:36:44 PM    Final       ASSESSMENT/PLAN   Acute hypoxemic respiratory failure Due to 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    -patient on gabapentin, fentanyl PRN, morphine PRN, xanax - I have advised patient to take minimal doses of these medications as they hinder respiratory drive and cause fatigue which hinder ability to mobilize patient.     Thank you for allowing me to participate in the care of this patient.   Patient/Family are satisfied with care plan and all questions have been answered.  This document was prepared using Dragon voice recognition software and may include unintentional dictation errors.     Ottie Glazier, M.D.  Div[facility] of [facility]

## 2021-01-09 NOTE — Progress Notes (Signed)
Phone call received from wife. Updated on patient's insomnia despite Xanax this shift.  Med requested already and awaiting response from MD floor coverage.

## 2021-01-09 NOTE — Progress Notes (Signed)
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  Patient is not sure whether any of other members have the same symptoms.  Patient also reported lightheadedness and dizziness with shortness of breath. He is fully vaccinated. Infectious disease and pulmonology consulted.  His presentation is consistent with Legionella pneumonia. Legionella antigen +.  Antibiotics changed to Levaquin.  Assessment & Plan:   Active Problems:   PNA (pneumonia)   Acute respiratory failure (HCC)   Acute hypoxic respiratory failure secondary to Legionella pneumonia : Patient presented with fever, cough and bilateral infiltrate on chest x-ray. Procalcitonin  0.73 Differential diagnoses include CAP, atypical,  viral pneumonia. Covid, influenza and parainfluenza negative,  RSV negative. MERS Covid -  Negative Legionella mycoplasma antigen positive Continued ceftriaxone, zithromax and doxycycline initially Continue supplemental oxygen to keep saturation above 94%. He is requiring heated high flow at 15 L sats 93%. He denies any mosquito bite, animal bite or tick bite. He denies any swimming in [place]. Infectious disease has been consulted . CT chest findings consistent with multifocal PNA. MRSA of the nares - negative. Pulmonology consulted,   Continue HHF and wean as tolerated. Continue solumedrol and inhalers. Patient presentation is consistent with Legionella pneumonia Patient will need 7 to 10 days of either Zithromax or  Levaquin. Antibiotics changed to Levaquin   Non- insulin-dependent diabetes mellitus: - Resumed home Metformin - Patient stopped taking metformin on his own about 2-3 weeks ago - Insulin SSI with HS coverage started. -Diabetes coordinator and registered dietitian -Insulin SSI  AKI - Improved. patient has had poor p.o. intake for the last 7 days and has been intermittently taking acetaminophen and ibuprofen for fever control. -LR IVF at 125 cc/h for 12 hours -Avoid nephrotoxic agents -BMP in the a.m.  Pneumonia:  Continue Levaquin for 7-10 days.  Hypertension-resumed home antihypertensive medications.  Anxiety/depression-  Continue Xanax 0.5 mg daily as needed for anxiety, citalopram 20 mg p.o. daily. Patient reports worsening anxiety with the steroids. Steroids discontinued today.  DVT prophylaxis: Lovenox Code Status:  Full code. Family Communication:Spouse at bed side. Disposition Plan:   Status is: Inpatient  Remains inpatient appropriate because:Inpatient level of care appropriate due to severity of illness   Dispo: The patient is from: Home              Anticipated d/c is to: Home              Anticipated d/c date is: > 3 days              Patient currently is not medically stable to d/c.   Difficult to place patient No   Consultants:   Infectious disease  Pulmonology  Procedures:  None.  Antimicrobials: Anti-infectives (From admission, onward)   Start     Dose/Rate Route Frequency Ordered Stop   01/08/21 1900  levofloxacin (LEVAQUIN) IVPB 750 mg        750 mg 100 mL/hr over 90 Minutes Intravenous Every 24 hours 01/08/21 1806     01/06/21 2000  azithromycin (ZITHROMAX) 500 mg in sodium chloride 0.9 %  250 mL IVPB  Status:  Discontinued        500 mg 250 mL/hr over 60 Minutes Intravenous Every 24 hours 01/05/21 1843 01/05/21 2216   01/06/21 1800  azithromycin (ZITHROMAX) 500 mg in sodium chloride 0.9 % 250 mL IVPB  Status:  Discontinued        500  mg 250 mL/hr over 60 Minutes Intravenous Every 24 hours 01/06/21 0959 01/08/21 1806   01/06/21 0600  cefTRIAXone (ROCEPHIN) 1 g in sodium chloride 0.9 % 100 mL IVPB  Status:  Discontinued        1 g 200 mL/hr over 30 Minutes Intravenous Every 24 hours 01/05/21 1843 01/05/21 2201   01/06/21 0600  cefTRIAXone (ROCEPHIN) 2 g in sodium chloride 0.9 % 100 mL IVPB  Status:  Discontinued        2 g 200 mL/hr over 30 Minutes Intravenous Every 24 hours 01/05/21 2201 01/08/21 0912   01/06/21 0600  doxycycline (VIBRAMYCIN) 100 mg in sodium chloride 0.9 % 250 mL IVPB  Status:  Discontinued        100 mg 125 mL/hr over 120 Minutes Intravenous Every 12 hours 01/05/21 2216 01/07/21 1820   01/05/21 1715  cefTRIAXone (ROCEPHIN) 1 g in sodium chloride 0.9 % 100 mL IVPB        1 g 200 mL/hr over 30 Minutes Intravenous  Once 01/05/21 1704 01/05/21 1843   01/05/21 1715  azithromycin (ZITHROMAX) 500 mg in sodium chloride 0.9 % 250 mL IVPB  Status:  Discontinued        500 mg 250 mL/hr over 60 Minutes Intravenous  Once 01/05/21 1704 01/05/21 1847      Subjective: Patient was seen and examined at bedside.  Overnight events noted.   Patient has not slept last night because of severe anxiety.  He states only slept 1 hour after Ativan was given. He reports breathing is getting better, He is still on HHF@ 15 L,  He denies any fever, chills, nausea and vomiting.   Objective: Vitals:   01/09/21 0338 01/09/21 0804 01/09/21 0807 01/09/21 1236  BP: (!) 142/81 (!) 152/76  (!) 149/77  Pulse: 66 61  66  Resp: (!) 24   (!) 24  Temp: 98.4 F (36.9 C) 98.1 F (36.7 C)  97.7 F (36.5 C)  TempSrc: Oral Oral  Oral  SpO2: 92% 97% 92% 93%  Weight:      Height:        Intake/Output Summary (Last 24 hours) at 01/09/2021 1458 Last data filed at 01/09/2021 1200 Gross per 24 hour  Intake 1986.59 ml  Output 1675 ml  Net 311.59 ml   Filed Weights   01/05/21 1359  Weight: 96.2 kg    Examination:  General exam:  Appears calm and comfortable, not in any distress. Respiratory system: Clear to auscultation. Respiratory effort normal. Cardiovascular system: S1 & S2 heard, RRR. No JVD, murmurs, rubs, gallops or clicks. No pedal edema. Gastrointestinal system: Abdomen is nondistended, soft and nontender. No organomegaly or masses felt. Normal bowel sounds heard. Central nervous system: Alert and oriented. No focal neurological deficits. Extremities: Symmetric 5 x 5 power., No edema. No cyanosis, No clubbing Skin: No rashes, lesions or ulcers Psychiatry: Judgement and insight appear normal. Mood & affect appropriate.     Data Reviewed: I have personally reviewed following labs and imaging studies  CBC: Recent Labs  Lab 01/04/21 1306 01/05/21 1428 01/06/21 0748 01/07/21 0447 01/08/21 0344 01/09/21 0244  WBC 9.4 10.4 9.0 10.5   13.7* 9.7  NEUTROABS 7,802* 8.3*  --   --   --   --   HGB 12.6* 11.5* 10.1* 10.2* 9.8* 9.2*  HCT 38.1* 33.5* 30.0* 31.5* 29.4* 28.4*  MCV 84.1 82.7 82.9 84.9 84.5 85.0  PLT 212 208 215 258 300 458   Basic Metabolic Panel: Recent Labs  Lab 01/05/21 1428 01/06/21 0748 01/07/21 0447 01/08/21 0344 01/09/21 0244  NA 134* 132* 136 136 135  K 3.1* 3.6 4.1 4.8 4.2  CL 99 98 103 105 106  CO2 23 22 20* 22 21*  GLUCOSE 235* 165* 249* 262* 192*  BUN 27* 23* 25* 37* 30*  CREATININE 1.48* 1.23 1.20 1.12 0.91  CALCIUM 8.0* 7.8* 8.0* 8.1* 7.7*  MG  --  2.1 2.6* 2.5* 2.0  PHOS  --   --  3.8 3.1 3.3   GFR: Estimated Creatinine Clearance: 95.8 mL/min (by C-G formula based on SCr of 0.91 mg/dL). Liver Function Tests: Recent Labs  Lab 01/04/21 1306 01/05/21 1428 01/07/21 0447 01/08/21 0344 01/09/21 0244  AST 26 72* 44* 27 48*  ALT 38 76* 57* 44 63*  ALKPHOS 64 66 62 60 52  BILITOT 0.6 0.6 0.7 0.4 0.6  PROT 6.0 6.6 6.0* 5.6* 5.6*  ALBUMIN 3.4* 3.0* 2.5* 2.3* 2.4*   No results for input(s): LIPASE, AMYLASE in the last 168 hours. No results for input(s): AMMONIA in the  last 168 hours. Coagulation Profile: No results for input(s): INR, PROTIME in the last 168 hours. Cardiac Enzymes: No results for input(s): CKTOTAL, CKMB, CKMBINDEX, TROPONINI in the last 168 hours. BNP (last 3 results) No results for input(s): PROBNP in the last 8760 hours. HbA1C: No results for input(s): HGBA1C in the last 72 hours. CBG: Recent Labs  Lab 01/08/21 1108 01/08/21 1657 01/08/21 1954 01/09/21 0803 01/09/21 1155  GLUCAP 233* 186* 188* 136* 142*   Lipid Profile: No results for input(s): CHOL, HDL, LDLCALC, TRIG, CHOLHDL, LDLDIRECT in the last 72 hours. Thyroid Function Tests: No results for input(s): TSH, T4TOTAL, FREET4, T3FREE, THYROIDAB in the last 72 hours. Anemia Panel: No results for input(s): VITAMINB12, FOLATE, FERRITIN, TIBC, IRON, RETICCTPCT in the last 72 hours. Sepsis Labs: Recent Labs  Lab 01/05/21 1426 01/05/21 1428 01/06/21 0748 01/07/21 0447 01/08/21 0344 01/09/21 0244  PROCALCITON  --    < > 0.73 0.71 0.57 0.24  LATICACIDVEN 1.3  --   --   --   --   --    < > = values in this interval not displayed.    Recent Results (from the past 240 hour(s))  Novel Coronavirus, NAA ([facility])     Status: None   Collection Time: 12/31/20  2:35 PM   Specimen: Nasopharyngeal(NP) swabs in vial transport medium   Nasopharynge  Result Value Ref Range Status   SARS-CoV-2, NAA Not Detected Not Detected Final    Comment: This nucleic acid amplification test was developed and its performance characteristics determined by [employer]. Nucleic acid amplification tests include RT-PCR and TMA. This test has not been FDA cleared or approved. This test has been authorized by FDA under an Emergency Use Authorization (EUA). This test is only authorized for the duration of time the declaration that circumstances exist justifying the authorization of the emergency use of in vitro diagnostic tests for detection of SARS-CoV-2 virus and/or diagnosis of COVID-19  infection under section 564(b)(1) of the Act, 21 U.S.C. 592TWK-4(Q) (1), unless the authorization is terminated or revoked sooner. When diagnostic testing is negative, the  possibility of a false negative result should be considered in the context of a patient's recent exposures and the presence of clinical signs and symptoms consistent with COVID-19. An individual without symptoms of COVID-19 and who is not shedding SARS-CoV-2 virus wo uld expect to have a negative (not detected) result in this assay.   SARS-COV-2, NAA 2 DAY TAT     Status: None   Collection Time: 12/31/20  2:35 PM   Nasopharynge  Result Value Ref Range Status   SARS-CoV-2, NAA 2 DAY TAT Performed  Final  Culture, blood (single) w Reflex to ID Panel     Status: None (Preliminary result)   Collection Time: 01/04/21  1:18 PM   Specimen: Blood  Result Value Ref Range Status   MICRO NUMBER: [id number]  Preliminary   SPECIMEN QUALITY: Adequate  Preliminary   Source NOT GIVEN  Preliminary   STATUS: PRELIMINARY  Preliminary   Result:   Preliminary    No growth to date. Culture is continuously monitored for a total of 120 hours incubation. A change in status will result in a phone report followed by an updated printed culture report.   COMMENT: Aerobic and anaerobic bottle received.  Preliminary  Culture, blood (single) w Reflex to ID Panel     Status: None (Preliminary result)   Collection Time: 01/04/21  1:18 PM   Specimen: Blood  Result Value Ref Range Status   MICRO NUMBER: [id number]  Preliminary   SPECIMEN QUALITY: Adequate  Preliminary   Source BLOOD  Preliminary   STATUS: PRELIMINARY  Preliminary   Result:   Preliminary    No growth to date. Culture is continuously monitored for a total of 120 hours incubation. A change in status will result in a phone report followed by an updated printed culture report.   COMMENT: Aerobic and anaerobic bottle received.  Preliminary  SARS Coronavirus 2 by RT PCR (hospital order,  performed in [facility] hospital lab) Nasopharyngeal Nasopharyngeal Swab     Status: None   Collection Time: 01/05/21  2:32 PM   Specimen: Nasopharyngeal Swab  Result Value Ref Range Status   SARS Coronavirus 2 NEGATIVE NEGATIVE Final    Comment: (NOTE) SARS-CoV-2 target nucleic acids are NOT DETECTED.  The SARS-CoV-2 RNA is generally detectable in upper and lower respiratory specimens during the acute phase of infection. The lowest concentration of SARS-CoV-2 viral copies this assay can detect is 250 copies / mL. A negative result does not preclude SARS-CoV-2 infection and should not be used as the sole basis for treatment or other patient management decisions.  A negative result may occur with improper specimen collection / handling, submission of specimen other than nasopharyngeal swab, presence of viral mutation(s) within the areas targeted by this assay, and inadequate number of viral copies (<250 copies / mL). A negative result must be combined with clinical observations, patient history, and epidemiological information.  Fact Sheet for Patients:   StrictlyIdeas.no  Fact Sheet for Healthcare Providers: BankingDealers.co.za  This test is not yet approved or  cleared by the [place] FDA and has been authorized for detection and/or diagnosis of SARS-CoV-2 by FDA under an Emergency Use Authorization (EUA).  This EUA will remain in effect (meaning this test can be used) for the duration of the COVID-19 declaration under Section 564(b)(1) of the Act, 21 U.S.C. section 360bbb-3(b)(1), unless the authorization is terminated or revoked sooner.  Performed at [facility], [address], [place], [place] [address]   Respiratory (~20 pathogens)  panel by PCR     Status: None   Collection Time: 01/05/21  5:14 PM   Specimen: Flu Kit Nasopharyngeal Swab; Respiratory  Result Value Ref Range Status   Adenovirus NOT DETECTED  NOT DETECTED Final   Coronavirus 229E NOT DETECTED NOT DETECTED Final    Comment: (NOTE) The Coronavirus on the Respiratory Panel, DOES NOT test for the novel  Coronavirus (2019 nCoV)    Coronavirus HKU1 NOT DETECTED NOT DETECTED Final   Coronavirus NL63 NOT DETECTED NOT DETECTED Final   Coronavirus OC43 NOT DETECTED NOT DETECTED Final   Metapneumovirus NOT DETECTED NOT DETECTED Final   Rhinovirus / Enterovirus NOT DETECTED NOT DETECTED Final   Influenza A NOT DETECTED NOT DETECTED Final   Influenza B NOT DETECTED NOT DETECTED Final   Parainfluenza Virus 1 NOT DETECTED NOT DETECTED Final   Parainfluenza Virus 2 NOT DETECTED NOT DETECTED Final   Parainfluenza Virus 3 NOT DETECTED NOT DETECTED Final   Parainfluenza Virus 4 NOT DETECTED NOT DETECTED Final   Respiratory Syncytial Virus NOT DETECTED NOT DETECTED Final   Bordetella pertussis NOT DETECTED NOT DETECTED Final   Bordetella Parapertussis NOT DETECTED NOT DETECTED Final   Chlamydophila pneumoniae NOT DETECTED NOT DETECTED Final   Mycoplasma pneumoniae NOT DETECTED NOT DETECTED Final    Comment: Performed at [facility], [address] [place], [place] [address]  MRSA PCR Screening     Status: None   Collection Time: 01/06/21  4:01 AM   Specimen: Urine, Clean Catch; Nasopharyngeal  Result Value Ref Range Status   MRSA by PCR NEGATIVE NEGATIVE Final    Comment:        The GeneXpert MRSA Assay (FDA approved for NASAL specimens only), is one component of a comprehensive MRSA colonization surveillance program. It is not intended to diagnose MRSA infection nor to guide or monitor treatment for MRSA infections. Performed at [facility], [address] [place], [place] [address]     Radiology Studies: No results found. Scheduled Meds: . acidophilus  1 capsule Oral Daily  . citalopram  20 mg Oral Daily  . enoxaparin (LOVENOX) injection  0.5 mg/kg Subcutaneous Q24H  . gabapentin  300 mg Oral QHS  .  irbesartan  150 mg Oral Daily   And  . hydrochlorothiazide  12.5 mg Oral Daily  . insulin aspart  0-5 Units Subcutaneous QHS  . insulin aspart  0-9 Units Subcutaneous TID WC  . insulin glargine  20 Units Subcutaneous Daily  . ipratropium-albuterol  3 mL Nebulization TID  . metFORMIN  500 mg Oral Daily  . methylPREDNISolone  4 mg Oral 3 x daily with food  . [START ON 01/10/2021] methylPREDNISolone  4 mg Oral 4X daily taper  . methylPREDNISolone  8 mg Oral Nightly  . multivitamin with minerals  1 tablet Oral Daily  . potassium chloride  20 mEq Oral BID  . Ensure Max Protein  11 oz Oral BID   Continuous Infusions: . levofloxacin (LEVAQUIN) IV Stopped (01/08/21 2007)     LOS: 3 days    Time spent: 25 mins.    PARDEEP KUMAR, MD [facility]   If 7PM-7AM, please contact night-coverage 

## 2021-01-10 ENCOUNTER — Inpatient Hospital Stay: Payer: BC Managed Care – PPO

## 2021-01-10 DIAGNOSIS — J9601 Acute respiratory failure with hypoxia: Secondary | ICD-10-CM | POA: Diagnosis not present

## 2021-01-10 DIAGNOSIS — I5033 Acute on chronic diastolic (congestive) heart failure: Secondary | ICD-10-CM | POA: Diagnosis not present

## 2021-01-10 DIAGNOSIS — J189 Pneumonia, unspecified organism: Secondary | ICD-10-CM | POA: Diagnosis not present

## 2021-01-10 DIAGNOSIS — I5030 Unspecified diastolic (congestive) heart failure: Secondary | ICD-10-CM | POA: Diagnosis present

## 2021-01-10 LAB — CBC
HCT: 31.2 % — ABNORMAL LOW (ref 39.0–52.0)
Hemoglobin: 10.2 g/dL — ABNORMAL LOW (ref 13.0–17.0)
MCH: 27.7 pg (ref 26.0–34.0)
MCHC: 32.7 g/dL (ref 30.0–36.0)
MCV: 84.8 fL (ref 80.0–100.0)
Platelets: 361 10*3/uL (ref 150–400)
RBC: 3.68 MIL/uL — ABNORMAL LOW (ref 4.22–5.81)
RDW: 13.5 % (ref 11.5–15.5)
WBC: 7.9 10*3/uL (ref 4.0–10.5)
nRBC: 0 % (ref 0.0–0.2)

## 2021-01-10 LAB — COMPREHENSIVE METABOLIC PANEL
ALT: 58 U/L — ABNORMAL HIGH (ref 0–44)
AST: 39 U/L (ref 15–41)
Albumin: 2.4 g/dL — ABNORMAL LOW (ref 3.5–5.0)
Alkaline Phosphatase: 51 U/L (ref 38–126)
Anion gap: 7 (ref 5–15)
BUN: 18 mg/dL (ref 6–20)
CO2: 24 mmol/L (ref 22–32)
Calcium: 8.2 mg/dL — ABNORMAL LOW (ref 8.9–10.3)
Chloride: 105 mmol/L (ref 98–111)
Creatinine, Ser: 0.97 mg/dL (ref 0.61–1.24)
GFR, Estimated: >60 mL/min (ref >60)
Glucose, Bld: 120 mg/dL — ABNORMAL HIGH (ref 70–99)
Potassium: 4.4 mmol/L (ref 3.5–5.1)
Sodium: 136 mmol/L (ref 135–145)
Total Bilirubin: 0.6 mg/dL (ref 0.3–1.2)
Total Protein: 5.5 g/dL — ABNORMAL LOW (ref 6.5–8.1)

## 2021-01-10 LAB — CULTURE, BLOOD (SINGLE)
MICRO NUMBER:: 11475927
MICRO NUMBER:: 11475929

## 2021-01-10 LAB — BRAIN NATRIURETIC PEPTIDE: B Natriuretic Peptide: 482.9 pg/mL — ABNORMAL HIGH (ref 0.0–100.0)

## 2021-01-10 LAB — GLUCOSE, CAPILLARY
Glucose-Capillary: 121 mg/dL — ABNORMAL HIGH (ref 70–99)
Glucose-Capillary: 193 mg/dL — ABNORMAL HIGH (ref 70–99)
Glucose-Capillary: 161 mg/dL — ABNORMAL HIGH (ref 70–99)
Glucose-Capillary: 132 mg/dL — ABNORMAL HIGH (ref 70–99)

## 2021-01-10 LAB — MAGNESIUM: Magnesium: 2.1 mg/dL (ref 1.7–2.4)

## 2021-01-10 LAB — PHOSPHORUS: Phosphorus: 4.7 mg/dL — ABNORMAL HIGH (ref 2.5–4.6)

## 2021-01-10 MED ORDER — SODIUM CHLORIDE 0.9% FLUSH
3.0000 mL | Freq: Two times a day (BID) | INTRAVENOUS | Status: DC
  Administered 2021-01-10 – 2021-01-15 (×11): 3 mL via INTRAVENOUS

## 2021-01-10 MED ORDER — SODIUM CHLORIDE 0.9 % IV SOLN
INTRAVENOUS | Status: DC | PRN
  Administered 2021-01-10: 1000 mL via INTRAVENOUS

## 2021-01-10 MED ORDER — FUROSEMIDE 10 MG/ML IJ SOLN
40.0000 mg | Freq: Two times a day (BID) | INTRAMUSCULAR | Status: DC
  Administered 2021-01-10 – 2021-01-11 (×2): 40 mg via INTRAVENOUS
  Filled 2021-01-10 (×2): qty 4

## 2021-01-10 NOTE — Evaluation (Signed)
Occupational Therapy Evaluation Patient Details Name: Jermaine Harris MRN: [id number] DOB: 30-Jul-1961 Today's Date: 01/10/2021    History of Present Illness 60 y.o. male with a history of HTN, DM presents with fever of 1 weeks duration cough and worsening sob of 2 days after traveling from [place].  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 Left on 1/13 returned to the [place] on 12/26/20. He was found to be acutely hypoxemic in respiratory distress requiring HFNC.   Clinical Impression   Pt seen for OT evaluation this date. Pt was independent in all ADL and functional mobility, working, and living in a 1-story home with wife. Upon arrival to room, pt seated upright in bed on 15L of O2 via HFNC, with wife present at bedside. Pt agreeable to session and O2 titrated to 13L per RN request. This session, pt required SUPERVISION/SET-UP for seated UB dressing and sit>stand LB dressing. During dressing, pt required x2 seated rest breaks d/t x2 O2 desats (84%); SpO2 able to recover >95% within 30 sec and RN made aware. Pt and wife educated in energy conservation strategies including pursed lip breathing, activity pacing, home/routines modifications, work simplification, and prioritizing of meaningful occupations. Pt verbalized understanding and would benefit from additional skilled OT services to maximize recall and carryover of learned techniques, facilitate implementation of learned techniques into daily routines, and maximize return to PLOF. Upon discharge, recommend home with intermittent supervision/assistance from wife.    Follow Up Recommendations  No OT follow up;Supervision - Intermittent    Equipment Recommendations  None recommended by OT       Precautions / Restrictions Precautions Precautions: Other (comment) (moderate fall risk) Restrictions Weight Bearing Restrictions: No      Mobility Bed Mobility Overal bed mobility: Independent                   Transfers Overall transfer level: Independent Equipment used: None            Balance Overall balance assessment: Modified Independent                                         ADL either performed or assessed with clinical judgement   ADL Overall ADL's : Needs assistance/impaired                 Upper Body Dressing : Supervision/safety;Sitting Upper Body Dressing Details (indicate cue type and reason): To don/doff overhead shirt Lower Body Dressing: Supervision/safety;Sit to/from stand Lower Body Dressing Details (indicate cue type and reason): to don/doff underwear and pants             Functional mobility during ADLs: Supervision/safety                    Pertinent Vitals/Pain Pain Assessment: No/denies pain        Extremity/Trunk Assessment Upper Extremity Assessment Upper Extremity Assessment: Overall WFL for tasks assessed   Lower Extremity Assessment Lower Extremity Assessment: Defer to PT evaluation   Cervical / Trunk Assessment Cervical / Trunk Assessment: Normal   Communication Communication Communication: No difficulties   Cognition Arousal/Alertness: Awake/alert Behavior During Therapy: WFL for tasks assessed/performed Overall Cognitive Status: Within Functional Limits for tasks assessed  General Comments: Pt agreeable to session, however after explaining role of OT, stated that he felt like he was "being treated like an infant"; receptive to education on energy conservation   General Comments  Pt on 13L O2, able to engage in standing UB/LB dressing, requiring x2 seated rest breaks d/t x2 O2 desat (84%). SpO2 able to recover >95% within 30 sec            Home Living Family/patient expects to be discharged to:: Private residence Living Arrangements: Spouse/significant other Available Help at Discharge: Family (wife works but can take leave for 24/7  support) Type of Home: House Home Access: Stairs to enter Technical brewer of Steps: 1+4 Entrance Stairs-Rails:  (no rail for first step, one rail after the landing) Home Layout: One level     Bathroom Shower/Tub: Occupational psychologist: [place]: [place] in          Prior Functioning/Environment Level of Independence: Independent        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[employer], 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, generally active        OT Problem List: Decreased strength;Decreased activity tolerance;Decreased safety awareness;Cardiopulmonary status limiting activity         OT Goals(Current goals can be found in the care plan section) Acute Rehab OT Goals Patient Stated Goal: to go home OT Goal Formulation: With patient Time For Goal Achievement: 01/24/21 Potential to Achieve Goals: Good ADL Goals Pt Will Perform Upper Body Bathing: with modified independence;standing Pt Will Perform Lower Body Bathing: with modified independence;sit to/from stand Pt Will Perform Tub/Shower Transfer: with modified independence;shower seat;ambulating  OT Frequency: Min 1X/week    AM-PAC OT "6 Clicks" Daily Activity     Outcome Measure Help from another person eating meals?: None Help from another person taking care of personal grooming?: A Little Help from another person toileting, which includes using toliet, bedpan, or urinal?: A Little Help from another person bathing (including washing, rinsing, drying)?: A Little Help from another person to put on and taking off regular upper body clothing?: A Little Help from another person to put on and taking off regular lower body clothing?: A Little 6 Click Score: 19   End of Session Nurse Communication: Mobility status;Other (comment) (SpO2)  Activity Tolerance: Patient tolerated treatment well Patient left: in bed;with call bell/phone within reach;with nursing/sitter in room  OT Visit Diagnosis:  Unsteadiness on feet (R26.81)                Time: 1607-3710 OT Time Calculation (min): 25 min Charges:  OT General Charges $OT Visit: 1 Visit OT Evaluation $OT Eval Moderate Complexity: 1 Mod OT Treatments $Self Care/Home Management : 8-22 mins  Fredirick Maudlin, OTR/L [place]

## 2021-01-10 NOTE — Progress Notes (Signed)
Physical Therapy Treatment Patient Details Name: Jermaine Harris MRN: [id number] DOB: September 10, 1961 Today's Date: 01/10/2021    History of Present Illness 60 y.o. male with a history of HTN, DM presents with fever of 1 weeks duration cough and worsening sob of 2 days after traveling from [place].  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 Left on 1/13 returned to the [place] on 12/26/20. He was found to be acutely hypoxemic in respiratory distress requiring HFNC.    PT Comments    Competed lap around large and small nursing unit pushing O2 tank and education regarding management provided.  No LOB noted.  MD in room upon arrival back and pt remains sitting for discussion with MD and wife.  Follow Up Recommendations  No PT follow up ([facility])     Equipment Recommendations  None recommended by PT    Recommendations for Other Services       Precautions / Restrictions Precautions Precautions: None Restrictions Weight Bearing Restrictions: No    Mobility  Bed Mobility Overal bed mobility: Independent                Transfers Overall transfer level: Independent Equipment used: None             General transfer comment: Much more confident and steady with sit to stand this date, not reaching with UEs  Ambulation/Gait Ambulation/Gait assistance: Supervision Gait Distance (Feet): 300 Feet Assistive device: None Gait Pattern/deviations: Step-through pattern Gait velocity: decreased   General Gait Details: pushed O2 tank and education provided.   Stairs Stairs: Yes Stairs assistance: Modified independent (Device/Increase time) Stair Management: One rail Left Number of Stairs: 8 General stair comments: Pt was able to safely negotiate up/down steps with single UE use and no stagger steps or hesitancy apart from deliberate step-to strategy while descending (reciprocal stratefy ascending)   Wheelchair Mobility    Modified  Rankin (Stroke Patients Only)       Balance Overall balance assessment: Modified Independent                                          Cognition Arousal/Alertness: Awake/alert Behavior During Therapy: WFL for tasks assessed/performed Overall Cognitive Status: Within Functional Limits for tasks assessed                                        Exercises      General Comments General comments (skin integrity, edema, etc.): Pt able to walk with increased confidence and speed this date, did not have any stagger stepping (seen multiple times during yesterday's ambulation) and went twice as far.  However, pt still needing high O2 levels (15L) but was able to maintain sats in the 90s t/o the moderate effort, HR up to the 120s but generally in the 90s-100s range most of the time w/o excessive c/o fatigue      Pertinent Vitals/Pain Pain Assessment: No/denies pain    Home Living Family/patient expects to be discharged to:: Private residence Living Arrangements: Spouse/significant other Available Help at Discharge: Family (wife works but can take leave for 24/7 support) Type of Home: House Home Access: Stairs to enter Entrance Stairs-Rails:  (no rail for first step, one rail after the landing) Home Layout: One level Home  Equipment: Civil engineer, contracting - built in      Prior Function Level of Independence: Independent      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[employer], 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, generally active   PT Goals (current goals can now be found in the care plan section) Progress towards PT goals: Progressing toward goals    Frequency    Min 2X/week      PT Plan Current plan remains appropriate    Co-evaluation              AM-PAC PT "6 Clicks" Mobility   Outcome Measure  Help needed turning from your back to your side while in a flat bed without using bedrails?: None Help needed moving from lying on your back to sitting on the side of a flat bed  without using bedrails?: None Help needed moving to and from a bed to a chair (including a wheelchair)?: None Help needed standing up from a chair using your arms (e.g., wheelchair or bedside chair)?: None Help needed to walk in hospital room?: None Help needed climbing 3-5 steps with a railing? : None 6 Click Score: 24    End of Session Equipment Utilized During Treatment: Gait belt;Oxygen Activity Tolerance: Patient tolerated treatment well;Patient limited by fatigue Patient left: with chair alarm set;with call bell/phone within reach Nurse Communication: Mobility status PT Visit Diagnosis: Muscle weakness (generalized) (M62.81);Difficulty in walking, not elsewhere classified (R26.2)     Time: 9509-3267 PT Time Calculation (min) (ACUTE ONLY): 11 min  Charges:  $Gait Training: 8-22 mins                    Chesley Noon, PTA 01/10/21, 12:26 PM

## 2021-01-10 NOTE — Progress Notes (Signed)
CPAP pressure set at 6 cm for pt comfort.

## 2021-01-10 NOTE — Progress Notes (Signed)
Physical Therapy Treatment Patient Details Name: Jermaine Harris MRN: [id number] DOB: May 26, 1961 Today's Date: 01/10/2021    History of Present Illness 60 y.o. male with a history of HTN, DM presents with fever of 1 weeks duration cough and worsening sob of 2 days after traveling from [place].  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 Left on 1/13 returned to the [place] on 12/26/20. He was found to be acutely hypoxemic in respiratory distress requiring HFNC.    PT Comments    Pt eager to see what he can do today and did do better with distance, stability and tolerance with ambulation and was able to do a 1/2 flight of steps as well.  Pt, however, is on 15L t/o the effort with shortness of breath and fatigue by the end of the effort.  He was able to maintain O2 in the mid/high 90s most of the time and HR generally stayed in the 90-100s range though he did peak into the 120s.  Overall pt doing well with safety, etc but is still very O2 depenedent and clearly is frustrated that he has not been able to taper down from this O2 level.  Good overall effort and motivation, no safety concerns today apart from cardio-pulm considerations.   Follow Up Recommendations  No PT follow up ([facility])     Equipment Recommendations  None recommended by PT    Recommendations for Other Services       Precautions / Restrictions Precautions Precautions: Other (comment) (moderate fall risk) Restrictions Weight Bearing Restrictions: No    Mobility  Bed Mobility Overal bed mobility: Independent                Transfers Overall transfer level: Independent Equipment used: None             General transfer comment: Much more confident and steady with sit to stand this date, not reaching with UEs  Ambulation/Gait Ambulation/Gait assistance: Supervision Gait Distance (Feet): 200 Feet Assistive device: None Gait Pattern/deviations: Step-through  pattern Gait velocity: decreased   General Gait Details: pushed O2 tank and education provided.   Stairs Stairs: Yes Stairs assistance: Modified independent (Device/Increase time) Stair Management: One rail Left Number of Stairs: 8 General stair comments: Pt was able to safely negotiate up/down steps with single UE use and no stagger steps or hesitancy apart from deliberate step-to strategy while descending (reciprocal stratefy ascending)   Wheelchair Mobility    Modified Rankin (Stroke Patients Only)       Balance Overall balance assessment: Modified Independent                                          Cognition Arousal/Alertness: Awake/alert Behavior During Therapy: WFL for tasks assessed/performed Overall Cognitive Status: Within Functional Limits for tasks assessed                                       Exercises      General Comments General comments (skin integrity, edema, etc.): Pt able to walk with increased confidence and speed this date, did not have any stagger stepping (seen multiple times during yesterday's ambulation) and went twice as far.  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, generally active   PT Goals (current goals can now be found in the care plan section) Progress towards PT goals: Progressing toward goals    Frequency    Min 2X/week      PT Plan Current plan remains appropriate    Co-evaluation              AM-PAC PT "6 Clicks" Mobility   Outcome Measure  Help needed turning from your back to your side while in a flat bed without using bedrails?:  None Help needed moving from lying on your back to sitting on the side of a flat bed without using bedrails?: None Help needed moving to and from a bed to a chair (including a wheelchair)?: None Help needed standing up from a chair using your arms (e.g., wheelchair or bedside chair)?: None Help needed to walk in hospital room?: None Help needed climbing 3-5 steps with a railing? : None 6 Click Score: 24    End of Session Equipment Utilized During Treatment: Gait belt;Oxygen Activity Tolerance: Patient tolerated treatment well;Patient limited by fatigue Patient left: with chair alarm set;with call bell/phone within reach Nurse Communication: Mobility status PT Visit Diagnosis: Muscle weakness (generalized) (M62.81);Difficulty in walking, not elsewhere classified (R26.2)     Time: 161-096 PT Time Calculation (min) (ACUTE ONLY): 41 min Charges:  $Gait Training: 23-37 mins  $ Therapeutic Activity 8-22 mins                    Kreg Shropshire, DPT 01/10/2021, 12:37 PM

## 2021-01-10 NOTE — Progress Notes (Signed)
PROGRESS NOTE    Jermaine Harris  WEX:[id number] DOB: 30-Aug-1961 DOA: 01/05/2021 PCP: Ria Bush, MD   Chief Complaint.  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  Patient is not sure whether any of other members have the same symptoms.  Patient also reported lightheadedness and dizziness with shortness of breath. He is fully vaccinated. Infectious disease and pulmonology consulted.  His presentation is consistent with Legionella pneumonia. Legionella antigen +.  Antibiotics changed to Levaquin.   Assessment & Plan:   Active Problems:   PNA (pneumonia)   Acute respiratory failure ([place])  #1.  Acute respiratory failure with hypoxemia secondary to pneumonia. Legionella pneumonia. Acute on chronic diastolic congestive heart failure. POA. Mild pulmonary hypertension. Patient condition does not seem to be improving with current antibiotics.  I reviewed the patient CT chest images, severity of pneumonia does not explain his severe hypoxemia, as a result, need to look for alternative causes. Reviewed patient echocardiogram performed at admission, ejection fraction was normal.  Right ventricular systolic pressure was 93.8.  Patient appears to have a mild pulmonary hypertension. I checked his BNP, it is moderately elevated at 482.  Patient appears to have acute on chronic diastolic congestive heart failure.  Right ventricular systolic pressure elevation may be due to congestive heart failure versus pulmonary hypertension.  I will start IV Lasix at 40 mg twice a day. Patient also seem to have symptoms of  sleep apnea.  We will try CPAP tonight. Also obtain bedside pulmonary function test. Continue Levaquin for Legionella pneumonia. Patient severe hypoxemia appears to be primarily due to congestive heart failure.  #2.  Type 2 diabetes. Continue current regimen.  3.  Acute kidney injury. Renal function improved.  4.  Pure essential hypertension. Continue home medicines     DVT prophylaxis: Lovenox Code Status: Full Family Communication:  Disposition Plan:  .   Status is: Inpatient  Remains inpatient appropriate because:Inpatient level of care appropriate due to severity of illness   Dispo: The patient is from: Home              Anticipated d/c is to: Home              Anticipated d/c date is: 3 days              Patient currently is not medically stable to d/c.   Difficult to place patient No        I/O last 3 completed shifts: In: 626.6 [I.V.:476.6; IV Piggyback:150] Out: 1017 [Urine:4025] Total I/O In: -  Out: 300 [Urine:300]     Consultants:   ID, Pulmonology  Procedures: None  Antimicrobials:None  Subjective: Patient still on 13 L oxygen, with segment short of breath with minimal exertion.  No cough. No fever chills. No abdominal pain nausea vomiting. No dysuria hematuria pain No headache or dizziness per No chest pain or palpitation.  Objective: Vitals:   01/09/21 2023 01/10/21 0525 01/10/21 0737 01/10/21 0814  BP: 135/74 134/71  (!) 159/84  Pulse: 67 66  72  Resp: 20 18  19   Temp: 98.9 F (37.2 C) 98.2 F (36.8 C)  97.8 F (36.6 C)  TempSrc: Oral   Oral  SpO2: 92% 96% 90% 91%  Weight:  97.2 kg    Height:        Intake/Output Summary (Last 24 hours) at 01/10/2021 1101 Last data filed at 01/10/2021 0814 Gross per 24 hour  Intake --  Output 2875 ml  Net -2875 ml   Filed Weights   01/05/21 1359 01/10/21 0525  Weight: 96.2 kg 97.2 kg    Examination:  General exam: Appears calm and comfortable  Respiratory system: Fine crackles  in the left base. Respiratory effort normal. Cardiovascular system: S1 & S2 heard, RRR. No JVD, murmurs, rubs, gallops or clicks. No pedal edema. Gastrointestinal system: Abdomen is nondistended, soft and nontender. No organomegaly or masses felt. Normal bowel sounds heard. Central nervous system: Alert and oriented. No focal neurological deficits. Extremities: Symmetric Skin: No rashes, lesions or ulcers Psychiatry:  Mood & affect appropriate.     Data Reviewed: I have personally reviewed following labs and imaging studies  CBC: Recent Labs  Lab 01/04/21 1306 01/05/21 1428 01/06/21 0748 01/07/21 0447 01/08/21 0344 01/09/21 0244 01/10/21 0354  WBC 9.4 10.4 9.0 10.5 13.7* 9.7 7.9  NEUTROABS 7,802* 8.3*  --   --   --   --   --   HGB 12.6* 11.5* 10.1* 10.2* 9.8* 9.2* 10.2*  HCT 38.1* 33.5* 30.0* 31.5* 29.4* 28.4* 31.2*  MCV 84.1 82.7 82.9 84.9 84.5 85.0 84.8  PLT 212 208 215 258 300 354 454   Basic Metabolic Panel: Recent Labs  Lab 01/06/21 0748 01/07/21 0447 01/08/21 0344 01/09/21 0244 01/10/21 0354  NA 132* 136 136 135 136  K 3.6 4.1 4.8 4.2 4.4  CL 98 103 105 106 105  CO2 22 20* 22 21* 24  GLUCOSE 165* 249* 262* 192* 120*  BUN 23* 25* 37* 30* 18  CREATININE 1.23 1.20 1.12 0.91 0.97  CALCIUM 7.8* 8.0* 8.1* 7.7* 8.2*  MG 2.1 2.6* 2.5* 2.0 2.1  PHOS  --  3.8 3.1 3.3 4.7*   GFR: Estimated Creatinine Clearance: 90.4 mL/min (by C-G formula based on SCr of 0.97 mg/dL). Liver Function Tests: Recent Labs  Lab 01/05/21 1428 01/07/21 0447 01/08/21 0344 01/09/21 0244 01/10/21 0354  AST 72* 44* 27 48* 39  ALT 76* 57* 44 63* 58*  ALKPHOS 66 62 60 52 51  BILITOT 0.6 0.7 0.4 0.6 0.6  PROT 6.6 6.0* 5.6* 5.6* 5.5*  ALBUMIN 3.0* 2.5* 2.3* 2.4* 2.4*   No results for input(s): LIPASE, AMYLASE in the last 168 hours. No results for input(s): AMMONIA in the last 168 hours. Coagulation Profile: No results for input(s): INR, PROTIME in the last 168 hours. Cardiac  Enzymes: No results for input(s): CKTOTAL, CKMB, CKMBINDEX, TROPONINI in the last 168 hours. BNP (last 3 results) No results for input(s): PROBNP in the last 8760 hours. HbA1C: No results for input(s): HGBA1C in the last 72 hours. CBG: Recent Labs  Lab 01/09/21 0803 01/09/21 1155 01/09/21 1701 01/09/21 2026 01/10/21 0815  GLUCAP 136* 142* 110* 139* 121*   Lipid Profile: No results for input(s): CHOL, HDL, LDLCALC, TRIG, CHOLHDL, LDLDIRECT in the last 72 hours. Thyroid Function Tests: No results for input(s): TSH, T4TOTAL, FREET4, T3FREE, THYROIDAB in the last 72 hours. Anemia Panel: No results for input(s): VITAMINB12, FOLATE, FERRITIN, TIBC, IRON, RETICCTPCT in the last 72 hours. Sepsis Labs: Recent Labs  Lab 01/05/21 1426 01/05/21 1428 01/06/21 0748 01/07/21 0447 01/08/21 0344 01/09/21 0244  PROCALCITON  --    < > 0.73  0.71 0.57 0.24  LATICACIDVEN 1.3  --   --   --   --   --    < > = values in this interval not displayed.    Recent Results (from the past 240 hour(s))  Novel Coronavirus, NAA ([facility])     Status: None   Collection Time: 12/31/20  2:35 PM   Specimen: Nasopharyngeal(NP) swabs in vial transport medium   Nasopharynge  Result Value Ref Range Status   SARS-CoV-2, NAA Not Detected Not Detected Final    Comment: This nucleic acid amplification test was developed and its performance characteristics determined by [employer]. Nucleic acid amplification tests include RT-PCR and TMA. This test has not been FDA cleared or approved. This test has been authorized by FDA under an Emergency Use Authorization (EUA). This test is only authorized for the duration of time the declaration that circumstances exist justifying the authorization of the emergency use of in vitro diagnostic tests for detection of SARS-CoV-2 virus and/or diagnosis of COVID-19 infection under section 564(b)(1) of the Act, 21 U.S.C. 993ZJI-9(C) (1), unless the authorization is  terminated or revoked sooner. When diagnostic testing is negative, the possibility of a false negative result should be considered in the context of a patient's recent exposures and the presence of clinical signs and symptoms consistent with COVID-19. An individual without symptoms of COVID-19 and who is not shedding SARS-CoV-2 virus wo uld expect to have a negative (not detected) result in this assay.   SARS-COV-2, NAA 2 DAY TAT     Status: None   Collection Time: 12/31/20  2:35 PM   Nasopharynge  Result Value Ref Range Status   SARS-CoV-2, NAA 2 DAY TAT Performed  Final  Culture, blood (single) w Reflex to ID Panel     Status: None   Collection Time: 01/04/21  1:18 PM   Specimen: Blood  Result Value Ref Range Status   MICRO NUMBER: [id number]  Final   SPECIMEN QUALITY: Suboptimal  Final   Source NOT GIVEN  Final   STATUS: FINAL  Final   Result:   Final    No growth after 5 days Inspection of blood culture bottles indicates that an inadequate volume of blood may have been collected for the detection of sepsis.   COMMENT: Aerobic and anaerobic bottle received.  Final  Culture, blood (single) w Reflex to ID Panel     Status: None   Collection Time: 01/04/21  1:18 PM   Specimen: Blood  Result Value Ref Range Status   MICRO NUMBER: [id number]  Final   SPECIMEN QUALITY: Suboptimal  Final   Source BLOOD  Final   STATUS: FINAL  Final   Result:   Final    No growth after 5 days Inspection of blood culture bottles indicates that an inadequate volume of blood may have been collected for the detection of sepsis.   COMMENT: Aerobic and anaerobic bottle received.  Final  SARS Coronavirus 2 by RT PCR (hospital order, performed in [facility] hospital lab) Nasopharyngeal Nasopharyngeal Swab     Status: None   Collection Time: 01/05/21  2:32 PM   Specimen: Nasopharyngeal Swab  Result Value Ref Range Status   SARS Coronavirus 2 NEGATIVE NEGATIVE Final    Comment: (NOTE) SARS-CoV-2 target  nucleic acids are NOT DETECTED.  The SARS-CoV-2 RNA is generally detectable in upper and lower respiratory specimens during the acute phase of infection. The lowest concentration of SARS-CoV-2 viral copies this assay can detect is 250 copies /  mL. A negative result does not preclude SARS-CoV-2 infection and should not be used as the sole basis for treatment or other patient management decisions.  A negative result may occur with improper specimen collection / handling, submission of specimen other than nasopharyngeal swab, presence of viral mutation(s) within the areas targeted by this assay, and inadequate number of viral copies (<250 copies / mL). A negative result must be combined with clinical observations, patient history, and epidemiological information.  Fact Sheet for Patients:   StrictlyIdeas.no  Fact Sheet for Healthcare Providers: BankingDealers.co.za  This test is not yet approved or  cleared by the [place] FDA and has been authorized for detection and/or diagnosis of SARS-CoV-2 by FDA under an Emergency Use Authorization (EUA).  This EUA will remain in effect (meaning this test can be used) for the duration of the COVID-19 declaration under Section 564(b)(1) of the Act, 21 U.S.C. section 360bbb-3(b)(1), unless the authorization is terminated or revoked sooner.  Performed at [facility], [place], [place] [address]   Respiratory (~20 pathogens) panel by PCR     Status: None   Collection Time: 01/05/21  5:14 PM   Specimen: Flu Kit Nasopharyngeal Swab; Respiratory  Result Value Ref Range Status   Adenovirus NOT DETECTED NOT DETECTED Final   Coronavirus 229E NOT DETECTED NOT DETECTED Final    Comment: (NOTE) The Coronavirus on the Respiratory Panel, DOES NOT test for the novel  Coronavirus (2019 nCoV)    Coronavirus HKU1 NOT DETECTED NOT DETECTED Final   Coronavirus NL63 NOT DETECTED NOT  DETECTED Final   Coronavirus OC43 NOT DETECTED NOT DETECTED Final   Metapneumovirus NOT DETECTED NOT DETECTED Final   Rhinovirus / Enterovirus NOT DETECTED NOT DETECTED Final   Influenza A NOT DETECTED NOT DETECTED Final   Influenza B NOT DETECTED NOT DETECTED Final   Parainfluenza Virus 1 NOT DETECTED NOT DETECTED Final   Parainfluenza Virus 2 NOT DETECTED NOT DETECTED Final   Parainfluenza Virus 3 NOT DETECTED NOT DETECTED Final   Parainfluenza Virus 4 NOT DETECTED NOT DETECTED Final   Respiratory Syncytial Virus NOT DETECTED NOT DETECTED Final   Bordetella pertussis NOT DETECTED NOT DETECTED Final   Bordetella Parapertussis NOT DETECTED NOT DETECTED Final   Chlamydophila pneumoniae NOT DETECTED NOT DETECTED Final   Mycoplasma pneumoniae NOT DETECTED NOT DETECTED Final    Comment: Performed at [facility], [place]. [address], [place], [place] [address]  MRSA PCR Screening     Status: None   Collection Time: 01/06/21  4:01 AM   Specimen: Urine, Clean Catch; Nasopharyngeal  Result Value Ref Range Status   MRSA by PCR NEGATIVE NEGATIVE Final    Comment:        The GeneXpert MRSA Assay (FDA approved for NASAL specimens only), is one component of a comprehensive MRSA colonization surveillance program. It is not intended to diagnose MRSA infection nor to guide or monitor treatment for MRSA infections. Performed at [facility], [address] [place], [place] [address]          Radiology Studies: No results found.      Scheduled Meds: . acidophilus  1 capsule Oral Daily  . citalopram  20 mg Oral Daily  . enoxaparin (LOVENOX) injection  0.5 mg/kg Subcutaneous Q24H  . furosemide  40 mg Intravenous Q12H  . gabapentin  300 mg Oral QHS  . irbesartan  150 mg Oral Daily   And  . hydrochlorothiazide  12.5 mg Oral Daily  .  insulin aspart  0-5 Units Subcutaneous QHS  . insulin aspart  0-9 Units Subcutaneous TID WC  . insulin glargine  20 Units Subcutaneous  Daily  . ipratropium-albuterol  3 mL Nebulization TID  . metFORMIN  500 mg Oral Daily  . multivitamin with minerals  1 tablet Oral Daily  . potassium chloride  20 mEq Oral BID  . Ensure Max Protein  11 oz Oral BID  . sodium chloride flush  3 mL Intravenous Q12H   Continuous Infusions: . levofloxacin (LEVAQUIN) IV 750 mg (01/09/21 1741)     LOS: 4 days    Time spent: 35 minutes    Sharen Hones, MD [facility]   To contact the attending provider between 7A-7P or the covering provider during after hours 7P-7A, please log into the web site [email or web address] and access using universal [place] password for that web site. If you do not have the password, please call the hospital operator.  01/10/2021, 11:01 AM

## 2021-01-10 NOTE — Progress Notes (Signed)
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   He left for [place] on 12/17/20 and reaced there eon 12/18/20 , stayed in [place] for a week, mostly had meeting in the hotel and visited certain places like [employer] . He spent a day in [place] and returned to the [place] on 12/26/20.He was found to be acutely hypoxemic in respiratory distress requiring HFNC. During my interview he has been weaned to [place] at 15L/min  01/09/2021- patient is tired today, he reports inability to sleep overnight.  Discussed with Dr Dwyane Dee today and we agreed on stopping steroids today.  I have encouraged him to attempt PT and OT again today.  He is weaned to 13L/min.  Bloodwork stable with resolution of leukocytosis and trending down procalcitonin.   01/10/2021- Patient examined at bedside.  He is clinically imporved but remains on 13-15L/min [place].  He had spirometry done today.  Additionally he has workup in process to investigate cardiac function.  His bloodwork shows BMP is within reference range, CBC with resolution of leucocytosis. He continues to work with PT.    PAST MEDICAL HISTORY   Past Medical History:  Diagnosis Date  . Actinic keratosis   . Claustrophobia   . GAD (generalized anxiety disorder)    claustrophobia, some panic attacks  . HTN (hypertension)   . Irritable bowel syndrome   . Lumbar herniated disc 06/2015  . Seasonal allergies      SURGICAL HISTORY   Past Surgical History:  Procedure  Laterality Date  . COLON SURGERY    . COLONOSCOPY  2012   mild diverticulosis, rec rpt 5 yrs 2/2 fmhx polyps  . COLONOSCOPY WITH PROPOFOL N/A 05/29/2019   TA, diverticulosis, rpt 5 yrs (Bonna Gains, Lennette Bihari, MD)  . MICRODISCECTOMY LUMBAR  07/2015   L4/5/S1 Trenton Gammon)  . TONSILLECTOMY       FAMILY HISTORY   Family History  Problem Relation Age of Onset  . Heart disease Father        pacer and CHF  . Depression Father   . Transient ischemic attack Mother 39       several  . Depression Mother   . Colon polyps Sister   . Sudden death Maternal Grandfather 3       ?mustard gas     SOCIAL HISTORY   Social History   Tobacco Use  . Smoking status: Never Smoker  . Smokeless tobacco: Never Used  Vaping Use  . Vaping Use: Never used  Substance Use Topics  . Alcohol use: Yes    Alcohol/week: 0.0 standard drinks    Comment: couple of drinks per week  . Drug use: Never     MEDICATIONS    Home Medication:    Current Medication:  Current Facility-Administered Medications:  .  acetaminophen (TYLENOL) tablet 650 mg, 650 mg, Oral, Q6H PRN, Athena Masse,  MD, 650 mg at 01/10/21 0828 .  acidophilus (RISAQUAD) capsule 1 capsule, 1 capsule, Oral, Daily, Ravishankar, Jayashree, MD, 1 capsule at 01/09/21 0832 .  albuterol (VENTOLIN HFA) 108 (90 Base) MCG/ACT inhaler 2 puff, 2 puff, Inhalation, Q6H PRN, Shawna Clamp, MD .  ALPRAZolam Duanne Moron) tablet 0.5 mg, 0.5 mg, Oral, Daily PRN, Cox, Amy N, DO, 0.5 mg at 01/09/21 1236 .  citalopram (CELEXA) tablet 20 mg, 20 mg, Oral, Daily, Cox, Amy N, DO, 20 mg at 01/10/21 0828 .  enoxaparin (LOVENOX) injection 47.5 mg, 0.5 mg/kg, Subcutaneous, Q24H, Cox, Amy N, DO, 47.5 mg at 01/09/21 2050 .  fentaNYL (SUBLIMAZE) injection 25 mcg, 25 mcg, Intravenous, Q4H PRN, Cox, Amy N, DO, 25 mcg at 01/06/21 0630 .  furosemide (LASIX) injection 40 mg, 40 mg, Intravenous, Q12H, Sharen Hones, MD, 40 mg at 01/10/21 1215 .  gabapentin (NEURONTIN) capsule 300 mg,  300 mg, Oral, QHS, Cox, Amy N, DO, 300 mg at 01/09/21 2049 .  irbesartan (AVAPRO) tablet 150 mg, 150 mg, Oral, Daily, 150 mg at 01/09/21 [id number] **AND** hydrochlorothiazide (MICROZIDE) capsule 12.5 mg, 12.5 mg, Oral, Daily, Cox, Amy N, DO, 12.5 mg at 01/10/21 [id number] .  insulin aspart (novoLOG) injection 0-5 Units, 0-5 Units, Subcutaneous, QHS, Cox, Amy N, DO, 3 Units at 01/07/21 2123 .  insulin aspart (novoLOG) injection 0-9 Units, 0-9 Units, Subcutaneous, TID WC, Cox, Amy N, DO, 2 Units at 01/10/21 1216 .  insulin glargine (LANTUS) injection 20 Units, 20 Units, Subcutaneous, Daily, Shawna Clamp, MD, 20 Units at 01/09/21 0831 .  ipratropium-albuterol (DUONEB) 0.5-2.5 (3) MG/3ML nebulizer solution 3 mL, 3 mL, Nebulization, TID, Shawna Clamp, MD, 3 mL at 01/10/21 1428 .  levofloxacin (LEVAQUIN) IVPB 750 mg, 750 mg, Intravenous, Q24H, Ravishankar, Jayashree, MD, Last Rate: 100 mL/hr at 01/09/21 1741, 750 mg at 01/09/21 1741 .  metFORMIN (GLUCOPHAGE-XR) 24 hr tablet 500 mg, 500 mg, Oral, Daily, Cox, Amy N, DO, 500 mg at 01/10/21 0828 .  metoprolol tartrate (LOPRESSOR) injection 5 mg, 5 mg, Intravenous, Q4H PRN, Cox, Amy N, DO .  multivitamin with minerals tablet 1 tablet, 1 tablet, Oral, Daily, Shawna Clamp, MD, 1 tablet at 01/10/21 [phone] .  ondansetron (ZOFRAN) tablet 4 mg, 4 mg, Oral, Q6H PRN **OR** ondansetron (ZOFRAN) injection 4 mg, 4 mg, Intravenous, Q6H PRN, Cox, Amy N, DO .  potassium chloride SA (KLOR-CON) CR tablet 20 mEq, 20 mEq, Oral, BID, Cox, Amy N, DO, 20 mEq at 01/09/21 2049 .  protein supplement (ENSURE MAX) liquid, 11 oz, Oral, BID, Shawna Clamp, MD, 11 oz at 01/10/21 1248 .  sodium chloride (OCEAN) 0.65 % nasal spray 2 spray, 2 spray, Nasal, PRN, Tsosie Billing, MD, 2 spray at 01/10/21 0827 .  sodium chloride flush (NS) 0.9 % injection 3 mL, 3 mL, Intravenous, Q12H, Sharen Hones, MD, 3 mL at 01/10/21 1248 .  traZODone (DESYREL) tablet 50 mg, 50 mg, Oral, QHS PRN, Athena Masse, MD, 50 mg at 01/09/21 2135    ALLERGIES   Ace inhibitors     REVIEW OF SYSTEMS    Review of Systems:  Gen:  Denies  fever, sweats, chills weigh loss  HEENT: Denies blurred vision, double vision, ear pain, eye pain, hearing loss, nose bleeds, sore throat Cardiac:  No dizziness, chest pain or heaviness, chest tightness,edema Resp:   Reports severe dyspnea Gi: Denies swallowing difficulty, stomach pain, nausea or vomiting, diarrhea, constipation, bowel incontinence Gu:  Denies bladder incontinence, burning urine Ext:   Denies Joint pain,  stiffness or swelling Skin: Denies  skin rash, easy bruising or bleeding or hives Endoc:  Denies polyuria, polydipsia , polyphagia or weight change Psych:   Denies depression, insomnia or hallucinations   Other:  All other systems negative   VS: BP 131/78 (BP Location: Right Arm)   Pulse 87   Temp 98.3 F (36.8 C) (Oral)   Resp 19   Ht 5' 7.25" (1.708 m)   Wt 97.2 kg   SpO2 91%   BMI 33.30 kg/m      PHYSICAL EXAM    GENERAL:NAD, no fevers, chills, no weakness no fatigue HEAD: Normocephalic, atraumatic.  EYES: Pupils equal, round, reactive to light. Extraocular muscles intact. No scleral icterus.  MOUTH: Moist mucosal membrane. Dentition intact. No abscess noted.  EAR, NOSE, THROAT: Clear without exudates. No external lesions.  NECK: Supple. No thyromegaly. No nodules. No JVD.  PULMONARY: decreased air entry bilaterally  CARDIOVASCULAR: S1 and S2. Regular rate and rhythm. No murmurs, rubs, or gallops. No edema. Pedal pulses 2+ bilaterally.  GASTROINTESTINAL: Soft, nontender, nondistended. No masses. Positive bowel sounds. No hepatosplenomegaly.  MUSCULOSKELETAL: No swelling, clubbing, or edema. Range of motion full in all extremities.  NEUROLOGIC: Cranial nerves II through XII are intact. No gross focal neurological deficits. Sensation intact. Reflexes intact.  SKIN: No ulceration, lesions, rashes, or cyanosis. Skin warm and  dry. Turgor intact.  PSYCHIATRIC: Mood, affect within normal limits. The patient is awake, alert and oriented x 3. Insight, judgment intact.       IMAGING    DG Chest 2 View  Result Date: 01/05/2021 CLINICAL DATA:  Question normal sepsis.  Fever EXAM: CHEST - 2 VIEW COMPARISON:  01/04/2021 FINDINGS: Right lower lobe infiltrate posteriorly unchanged from the prior study. No significant pleural effusion. Mild left lower lobe airspace disease unchanged. Negative for heart failure IMPRESSION: Bibasilar infiltrates right greater than left unchanged. Electronically Signed   By: Franchot Gallo M.D.   On: 01/05/2021 15:14   DG Chest 2 View  Result Date: 01/05/2021 CLINICAL DATA:  Dry cough, fever for 7 8 days EXAM: CHEST - 2 VIEW COMPARISON:  None. FINDINGS: The heart size and mediastinal contours are within normal limits. Heterogeneous airspace opacity of the dependent right lower lobe. The visualized skeletal structures are unremarkable. IMPRESSION: Heterogeneous airspace opacity of the dependent right lower lobe, consistent with infection or aspiration. Recommend follow-up radiographs in 6-8 weeks to ensure complete radiographic resolution and exclude underlying malignancy. Electronically Signed   By: Eddie Candle M.D.   On: 01/05/2021 08:19   CT CHEST WO CONTRAST  Result Date: 01/06/2021 CLINICAL DATA:  Respiratory failure.  Fevers for 1 week. EXAM: CT CHEST WITHOUT CONTRAST TECHNIQUE: Multidetector CT imaging of the chest was performed following the standard protocol without IV contrast. COMPARISON:  None. FINDINGS: Cardiovascular: The heart size appears normal. Aortic atherosclerosis. No pericardial effusion. Mediastinum/Nodes: No enlarged mediastinal or axillary lymph nodes. Calcified mediastinal and hilar lymph nodes are identified compatible with chronic granulomatous disease. Thyroid gland, trachea, and esophagus demonstrate no significant findings. Lungs/Pleura: Dense airspace consolidation with  air bronchograms are noted involving both lower lobes consistent with multifocal pneumonia. Right middle lobe, lingula and both upper lobes appear clear. No pleural effusion or signs of interstitial edema. Involves bilateral lower lobes with ground Upper Abdomen: Diffuse hepatic steatosis. No acute findings identified within the imaged portions of the upper abdomen. Musculoskeletal: No chest wall mass or suspicious bone lesions identified. IMPRESSION: 1. Bilateral lower lobe airspace consolidation with air bronchograms consistent  with multifocal pneumonia. 2. Hepatic steatosis. 3. Aortic atherosclerosis. Aortic Atherosclerosis (ICD10-I70.0). Electronically Signed   By: Kerby Moors M.D.   On: 01/06/2021 18:58   DG Chest Port 1 View  Result Date: 01/10/2021 CLINICAL DATA:  Pneumonia, lesion or disease EXAM: PORTABLE CHEST 1 VIEW COMPARISON:  01/05/2021 FINDINGS: Mild bibasilar airspace disease. No pleural effusion or pneumothorax. Heart and mediastinal contours are unremarkable. No acute osseous abnormality. IMPRESSION: Mild bibasilar airspace disease concerning for pneumonia. Electronically Signed   By: Kathreen Devoid   On: 01/10/2021 13:15   ECHOCARDIOGRAM COMPLETE  Result Date: 01/07/2021    ECHOCARDIOGRAM REPORT   Patient Name:   Jermaine Harris Date of Exam: 01/07/2021 Medical Rec #:  [id number]      Height:       67.2 in Accession #:    [id number]     Weight:       212.0 lb Date of Birth:  October 20, 1961      BSA:          2.078 m Patient Age:    70 years       BP:           124/67 mmHg Patient Gender: M              HR:           56 bpm. Exam Location:  [facility] Procedure: 2D Echo, Cardiac Doppler and Color Doppler Indications:     Acute respiratory distress R06.03  History:         Patient has no prior history of Echocardiogram examinations.                  Risk Factors:Hypertension.  Sonographer:     Sherrie Sport RDCS (AE) Referring Phys:  [id number] Awilda Bill Diagnosing Phys: Ida Rogue MD IMPRESSIONS  1.  Left ventricular ejection fraction, by estimation, is 55 to 60%. The left ventricle has normal function. The left ventricle has no regional wall motion abnormalities. Left ventricular diastolic parameters are consistent with Grade II diastolic dysfunction (pseudonormalization).  2. Right ventricular systolic function is normal. The right ventricular size is normal. There is mildly elevated pulmonary artery systolic pressure. The estimated right ventricular systolic pressure is 32.3 mmHg.  3. Left atrial size was mildly dilated.  4. The mitral valve is normal in structure. Mild to moderate mitral valve regurgitation. FINDINGS  Left Ventricle: Left ventricular ejection fraction, by estimation, is 55 to 60%. The left ventricle has normal function. The left ventricle has no regional wall motion abnormalities. The left ventricular internal cavity size was normal in size. There is  no left ventricular hypertrophy. Left ventricular diastolic parameters are consistent with Grade II diastolic dysfunction (pseudonormalization). Right Ventricle: The right ventricular size is normal. No increase in right ventricular wall thickness. Right ventricular systolic function is normal. There is mildly elevated pulmonary artery systolic pressure. The tricuspid regurgitant velocity is 2.80  m/s, and with an assumed right atrial pressure of 5 mmHg, the estimated right ventricular systolic pressure is 55.7 mmHg. Left Atrium: Left atrial size was mildly dilated. Right Atrium: Right atrial size was normal in size. Pericardium: There is no evidence of pericardial effusion. Mitral Valve: The mitral valve is normal in structure. Mild mitral annular calcification. Mild to moderate mitral valve regurgitation. No evidence of mitral valve stenosis. Tricuspid Valve: The tricuspid valve is normal in structure. Tricuspid valve regurgitation is not demonstrated. No evidence of tricuspid stenosis. Aortic Valve: The aortic valve is normal in  structure.  Aortic valve regurgitation is not visualized. Mild aortic valve sclerosis is present, with no evidence of aortic valve stenosis. Aortic valve mean gradient measures 3.0 mmHg. Aortic valve peak gradient measures 4.4 mmHg. Aortic valve area, by VTI measures 2.76 cm. Pulmonic Valve: The pulmonic valve was normal in structure. Pulmonic valve regurgitation is not visualized. No evidence of pulmonic stenosis. Aorta: The aortic root is normal in size and structure. Venous: The inferior vena cava is normal in size with greater than 50% respiratory variability, suggesting right atrial pressure of 3 mmHg. IAS/Shunts: No atrial level shunt detected by color flow Doppler.  LEFT VENTRICLE PLAX 2D LVIDd:         5.01 cm  Diastology LVIDs:         3.32 cm  LV e' medial:    3.65 cm/s LV PW:         1.24 cm  LV E/e' medial:  24.3 LV IVS:        0.86 cm  LV e' lateral:   11.50 cm/s LVOT diam:     2.10 cm  LV E/e' lateral: 7.7 LV SV:         67 LV SV Index:   32 LVOT Area:     3.46 cm  RIGHT VENTRICLE RV Basal diam:  4.35 cm RV S prime:     11.91 cm/s TAPSE (M-mode): 4.2 cm LEFT ATRIUM             Index       RIGHT ATRIUM           Index LA diam:        4.40 cm 2.12 cm/m  RA Area:     27.00 cm LA Vol (A2C):   79.7 ml 38.35 ml/m RA Volume:   93.50 ml  44.99 ml/m LA Vol (A4C):   91.4 ml 43.98 ml/m LA Biplane Vol: 85.7 ml 41.24 ml/m  AORTIC VALVE                   PULMONIC VALVE AV Area (Vmax):    2.52 cm    PV Vmax:        0.64 m/s AV Area (Vmean):   2.52 cm    PV Peak grad:   1.6 mmHg AV Area (VTI):     2.76 cm    RVOT Peak grad: 3 mmHg AV Vmax:           105.00 cm/s AV Vmean:          79.600 cm/s AV VTI:            0.241 m AV Peak Grad:      4.4 mmHg AV Mean Grad:      3.0 mmHg LVOT Vmax:         76.40 cm/s LVOT Vmean:        57.900 cm/s LVOT VTI:          0.192 m LVOT/AV VTI ratio: 0.80  AORTA Ao Root diam: 3.43 cm MITRAL VALVE               TRICUSPID VALVE MV Area (PHT): 5.62 cm    TR Peak grad:   31.4 mmHg MV Decel  Time: 135 msec    TR Vmax:        280.00 cm/s MV E velocity: 88.70 cm/s MV A velocity: 61.30 cm/s  SHUNTS MV E/A ratio:  1.45        Systemic VTI:  0.19 m  Systemic Diam: 2.10 cm Ida Rogue MD Electronically signed by Ida Rogue MD Signature Date/Time: 01/07/2021/3:36:44 PM    Final       ASSESSMENT/PLAN   Acute hypoxemic respiratory failure Due to 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    -patient on gabapentin, fentanyl PRN, morphine PRN, xanax - I have advised patient to take minimal doses of these medications as they hinder respiratory drive and cause fatigue which hinder ability to mobilize patient.     Thank you for allowing me to participate in the care of this patient.   Patient/Family are satisfied with care plan and all questions have been answered.  This document was prepared using Dragon voice recognition software and may include unintentional dictation errors.     Ottie Glazier, M.D.  Division of [facility]

## 2021-01-11 ENCOUNTER — Telehealth: Payer: BC Managed Care – PPO | Admitting: Family Medicine

## 2021-01-11 DIAGNOSIS — A481 Legionnaires' disease: Secondary | ICD-10-CM | POA: Diagnosis not present

## 2021-01-11 DIAGNOSIS — E119 Type 2 diabetes mellitus without complications: Secondary | ICD-10-CM

## 2021-01-11 DIAGNOSIS — J9601 Acute respiratory failure with hypoxia: Secondary | ICD-10-CM | POA: Diagnosis not present

## 2021-01-11 DIAGNOSIS — I5033 Acute on chronic diastolic (congestive) heart failure: Secondary | ICD-10-CM | POA: Diagnosis not present

## 2021-01-11 DIAGNOSIS — F419 Anxiety disorder, unspecified: Secondary | ICD-10-CM

## 2021-01-11 DIAGNOSIS — J189 Pneumonia, unspecified organism: Secondary | ICD-10-CM | POA: Diagnosis not present

## 2021-01-11 DIAGNOSIS — N179 Acute kidney failure, unspecified: Secondary | ICD-10-CM | POA: Diagnosis not present

## 2021-01-11 LAB — COMPREHENSIVE METABOLIC PANEL
ALT: 60 U/L — ABNORMAL HIGH (ref 0–44)
AST: 30 U/L (ref 15–41)
Albumin: 2.7 g/dL — ABNORMAL LOW (ref 3.5–5.0)
Alkaline Phosphatase: 54 U/L (ref 38–126)
Anion gap: 7 (ref 5–15)
BUN: 24 mg/dL — ABNORMAL HIGH (ref 6–20)
CO2: 27 mmol/L (ref 22–32)
Calcium: 8.3 mg/dL — ABNORMAL LOW (ref 8.9–10.3)
Chloride: 103 mmol/L (ref 98–111)
Creatinine, Ser: 1.15 mg/dL (ref 0.61–1.24)
GFR, Estimated: >60 mL/min (ref >60)
Glucose, Bld: 153 mg/dL — ABNORMAL HIGH (ref 70–99)
Potassium: 4.3 mmol/L (ref 3.5–5.1)
Sodium: 137 mmol/L (ref 135–145)
Total Bilirubin: 0.6 mg/dL (ref 0.3–1.2)
Total Protein: 6 g/dL — ABNORMAL LOW (ref 6.5–8.1)

## 2021-01-11 LAB — MAGNESIUM: Magnesium: 2.1 mg/dL (ref 1.7–2.4)

## 2021-01-11 LAB — CBC
HCT: 34.5 % — ABNORMAL LOW (ref 39.0–52.0)
Hemoglobin: 11.1 g/dL — ABNORMAL LOW (ref 13.0–17.0)
MCH: 27.7 pg (ref 26.0–34.0)
MCHC: 32.2 g/dL (ref 30.0–36.0)
MCV: 86 fL (ref 80.0–100.0)
Platelets: 392 10*3/uL (ref 150–400)
RBC: 4.01 MIL/uL — ABNORMAL LOW (ref 4.22–5.81)
RDW: 13.3 % (ref 11.5–15.5)
WBC: 8.6 10*3/uL (ref 4.0–10.5)
nRBC: 0 % (ref 0.0–0.2)

## 2021-01-11 LAB — GLUCOSE, CAPILLARY
Glucose-Capillary: 148 mg/dL — ABNORMAL HIGH (ref 70–99)
Glucose-Capillary: 187 mg/dL — ABNORMAL HIGH (ref 70–99)
Glucose-Capillary: 202 mg/dL — ABNORMAL HIGH (ref 70–99)
Glucose-Capillary: 166 mg/dL — ABNORMAL HIGH (ref 70–99)
Glucose-Capillary: 162 mg/dL — ABNORMAL HIGH (ref 70–99)

## 2021-01-11 LAB — PHOSPHORUS: Phosphorus: 4.2 mg/dL (ref 2.5–4.6)

## 2021-01-11 MED ORDER — FUROSEMIDE 10 MG/ML IJ SOLN
40.0000 mg | Freq: Every day | INTRAMUSCULAR | Status: DC
  Administered 2021-01-11: 40 mg via INTRAVENOUS
  Filled 2021-01-11: qty 4

## 2021-01-11 MED ORDER — MOMETASONE FURO-FORMOTEROL FUM 100-5 MCG/ACT IN AERO
2.0000 | INHALATION_SPRAY | Freq: Two times a day (BID) | RESPIRATORY_TRACT | Status: DC
  Administered 2021-01-11 – 2021-01-15 (×9): 2 via RESPIRATORY_TRACT
  Filled 2021-01-11: qty 8.8

## 2021-01-11 NOTE — Progress Notes (Signed)
Called to patient room by RN. States that patient's sats are 86% on 15L bubble HFNC with NRB mask. Informed her that I would attempt to start heated HFNC. Arrived to patient room. Patient informed me that he had gotten up to the bedside and had a panic attack. States that his breathing is better at this time and that he requests to stay on bubble HFNC for now. SpO2 is 95%. Left the heated HFNC in room. Will continue to monitor.

## 2021-01-11 NOTE — Progress Notes (Signed)
PROGRESS NOTE    Jermaine Harris  GNF:[id number] DOB: [birth year] DOA: 01/05/2021 PCP: Ria Bush, MD   Chief complaint.  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 Patient is not sure whether any of other members have the same symptoms. Patient also reported lightheadedness and dizziness with shortness of breath. He is fully vaccinated. Infectious disease and pulmonology consulted.His presentation is consistent with Legionella pneumonia. Legionella antigen +. Antibiotics changed to Levaquin. Patient was giving IV Lasix 2/6, he had a BMP elevation of 482.  Echocardiogram showed normal ejection fraction with a right ventricular pressure 36.4.   Assessment & Plan:   Active Problems:   PNA (pneumonia)   Acute respiratory failure (HCC)   Acute on chronic diastolic CHF (congestive heart failure) ([place])  #1.  Acute respiratory failure with hypoxemia secondary to pneumonia. Legionella pneumonia. Acute on chronic diastolic congestive heart failure. POA. Mild pulmonary hypertension. Patient could not tolerate CPAP last night, he received IV Lasix.  Currently on 12 L oxygen this morning.  I will continue IV Lasix 40 mg daily for another day.  Also added bronchodilator.  Continue wean oxygen. Continue Levaquin to complete 10-day course.  #2.  Type 2 diabetes. Continue current regimen.  3.  Acute kidney injury. Renal function improved.   DVT prophylaxis: Lovenox Code Status: Full Family Communication: Updated wife yesterday afternoon. Disposition Plan:  .   Status is: Inpatient  Remains  inpatient appropriate because:Inpatient level of care appropriate due to severity of illness   Dispo: The patient is from: Home              Anticipated d/c is to: Home              Anticipated d/c date is: 3 days              Patient currently is not medically stable to d/c.   Difficult to place patient No        I/O last 3 completed shifts: In: 382.8 [I.V.:4.4; IV Piggyback:378.4] Out: 3600 [Urine:3600] Total I/O In: 243 [P.O.:240; I.V.:3] Out: 300 [Urine:300]     Consultants:   Pulm  Procedures: None  Antimicrobials:  Levaquin  Subjective: Patient tried CPAP for an hour, could not tolerate it.  He could not tolerate nasal cannula while asleep, he was wearing facial mask overnight. He still has some short of breath with exertion, no wheezing.  Minimal cough. No fever or chills. No abdominal pain or nausea vomiting. No dysuria or hematuria. Mild headache no dizziness.  Objective: Vitals:   01/10/21 2120 01/10/21 2314 01/11/21 0339 01/11/21 0755  BP:  123/63 123/74 (!) 107/58  Pulse:  82 76 65  Resp:  (!) 22 16 16   Temp:  98.2 F (36.8 C) 97.7 F (36.5 C) 98.3 F (36.8 C)  TempSrc:   Oral Oral  SpO2: 97% 92% 92% 99%  Weight:   91.8 kg   Height:        Intake/Output Summary (Last 24 hours) at 01/11/2021 0911 Last data filed at 01/11/2021 0838 Gross per 24 hour  Intake 625.78 ml  Output 2000 ml  Net -1374.22 ml   Filed Weights   01/05/21 1359  01/10/21 0525 01/11/21 0339  Weight: 96.2 kg 97.2 kg 91.8 kg    Examination:  General exam: Appears calm and comfortable  Respiratory system: Fine crackles in right lung when patient is lying on the right side. Respiratory effort normal. Cardiovascular system: S1 & S2 heard, RRR. No JVD, murmurs, rubs, gallops or clicks. No pedal edema. Gastrointestinal system: Abdomen is nondistended, soft and nontender. No organomegaly or masses felt. Normal bowel sounds heard. Central nervous system: Alert and oriented. No  focal neurological deficits. Extremities: Symmetric 5 x 5 power. Skin: No rashes, lesions or ulcers Psychiatry: Judgement and insight appear normal. Mood & affect appropriate.     Data Reviewed: I have personally reviewed following labs and imaging studies  CBC: Recent Labs  Lab 01/04/21 1306 01/05/21 1428 01/06/21 0748 01/07/21 0447 01/08/21 0344 01/09/21 0244 01/10/21 0354 01/11/21 0336  WBC 9.4 10.4   < > 10.5 13.7* 9.7 7.9 8.6  NEUTROABS 7,802* 8.3*  --   --   --   --   --   --   HGB 12.6* 11.5*   < > 10.2* 9.8* 9.2* 10.2* 11.1*  HCT 38.1* 33.5*   < > 31.5* 29.4* 28.4* 31.2* 34.5*  MCV 84.1 82.7   < > 84.9 84.5 85.0 84.8 86.0  PLT 212 208   < > 258 300 354 361 392   < > = values in this interval not displayed.   Basic Metabolic Panel: Recent Labs  Lab 01/07/21 0447 01/08/21 0344 01/09/21 0244 01/10/21 0354 01/11/21 0336  NA 136 136 135 136 137  K 4.1 4.8 4.2 4.4 4.3  CL 103 105 106 105 103  CO2 20* 22 21* 24 27  GLUCOSE 249* 262* 192* 120* 153*  BUN 25* 37* 30* 18 24*  CREATININE 1.20 1.12 0.91 0.97 1.15  CALCIUM 8.0* 8.1* 7.7* 8.2* 8.3*  MG 2.6* 2.5* 2.0 2.1 2.1  PHOS 3.8 3.1 3.3 4.7* 4.2   GFR: Estimated Creatinine Clearance: 74.1 mL/min (by C-G formula based on SCr of 1.15 mg/dL). Liver Function Tests: Recent Labs  Lab 01/07/21 0447 01/08/21 0344 01/09/21 0244 01/10/21 0354 01/11/21 0336  AST 44* 27 48* 39 30  ALT 57* 44 63* 58* 60*  ALKPHOS 62 60 52 51 54  BILITOT 0.7 0.4 0.6 0.6 0.6  PROT 6.0* 5.6* 5.6* 5.5* 6.0*  ALBUMIN 2.5* 2.3* 2.4* 2.4* 2.7*   No results for input(s): LIPASE, AMYLASE in the last 168 hours. No results for input(s): AMMONIA in the last 168 hours. Coagulation Profile: No results for input(s): INR, PROTIME in the last 168 hours. Cardiac Enzymes: No results for input(s): CKTOTAL, CKMB, CKMBINDEX, TROPONINI in the last 168 hours. BNP (last 3 results) No results for input(s): PROBNP in the last 8760 hours. HbA1C: No  results for input(s): HGBA1C in the last 72 hours. CBG: Recent Labs  Lab 01/10/21 0815 01/10/21 1139 01/10/21 1617 01/10/21 2013 01/11/21 0734  GLUCAP 121* 193* 161* 132* 148*   Lipid Profile: No results for input(s): CHOL, HDL, LDLCALC, TRIG, CHOLHDL, LDLDIRECT in the last 72 hours. Thyroid Function Tests: No results for input(s): TSH, T4TOTAL, FREET4, T3FREE, THYROIDAB in the last 72 hours. Anemia Panel: No results for input(s): VITAMINB12, FOLATE, FERRITIN, TIBC, IRON, RETICCTPCT in the last 72 hours. Sepsis Labs: Recent Labs  Lab 01/05/21 1426 01/05/21 1428 01/06/21 0748 01/07/21 0447 01/08/21 0344 01/09/21 0244  PROCALCITON  --    < > 0.73 0.71 0.57 0.24  LATICACIDVEN 1.3  --   --   --   --   --    < > =  values in this interval not displayed.    Recent Results (from the past 240 hour(s))  Culture, blood (single) w Reflex to ID Panel     Status: None   Collection Time: 01/04/21  1:18 PM   Specimen: Blood  Result Value Ref Range Status   MICRO NUMBER: [id number]  Final   SPECIMEN QUALITY: Suboptimal  Final   Source NOT GIVEN  Final   STATUS: FINAL  Final   Result:   Final    No growth after 5 days Inspection of blood culture bottles indicates that an inadequate volume of blood may have been collected for the detection of sepsis.   COMMENT: Aerobic and anaerobic bottle received.  Final  Culture, blood (single) w Reflex to ID Panel     Status: None   Collection Time: 01/04/21  1:18 PM   Specimen: Blood  Result Value Ref Range Status   MICRO NUMBER: [id number]  Final   SPECIMEN QUALITY: Suboptimal  Final   Source BLOOD  Final   STATUS: FINAL  Final   Result:   Final    No growth after 5 days Inspection of blood culture bottles indicates that an inadequate volume of blood may have been collected for the detection of sepsis.   COMMENT: Aerobic and anaerobic bottle received.  Final  SARS Coronavirus 2 by RT PCR (hospital order, performed in [facility] hospital lab)  Nasopharyngeal Nasopharyngeal Swab     Status: None   Collection Time: 01/05/21  2:32 PM   Specimen: Nasopharyngeal Swab  Result Value Ref Range Status   SARS Coronavirus 2 NEGATIVE NEGATIVE Final    Comment: (NOTE) SARS-CoV-2 target nucleic acids are NOT DETECTED.  The SARS-CoV-2 RNA is generally detectable in upper and lower respiratory specimens during the acute phase of infection. The lowest concentration of SARS-CoV-2 viral copies this assay can detect is 250 copies / mL. A negative result does not preclude SARS-CoV-2 infection and should not be used as the sole basis for treatment or other patient management decisions.  A negative result may occur with improper specimen collection / handling, submission of specimen other than nasopharyngeal swab, presence of viral mutation(s) within the areas targeted by this assay, and inadequate number of viral copies (<250 copies / mL). A negative result must be combined with clinical observations, patient history, and epidemiological information.  Fact Sheet for Patients:   StrictlyIdeas.no  Fact Sheet for Healthcare Providers: BankingDealers.co.za  This test is not yet approved or  cleared by the [place] FDA and has been authorized for detection and/or diagnosis of SARS-CoV-2 by FDA under an Emergency Use Authorization (EUA).  This EUA will remain in effect (meaning this test can be used) for the duration of the COVID-19 declaration under Section 564(b)(1) of the Act, 21 U.S.C. section 360bbb-3(b)(1), unless the authorization is terminated or revoked sooner.  Performed at [facility], [place], [place] [address]   Respiratory (~20 pathogens) panel by PCR     Status: None   Collection Time: 01/05/21  5:14 PM   Specimen: Flu Kit Nasopharyngeal Swab; Respiratory  Result Value Ref Range Status   Adenovirus NOT DETECTED NOT DETECTED Final   Coronavirus 229E  NOT DETECTED NOT DETECTED Final    Comment: (NOTE) The Coronavirus on the Respiratory Panel, DOES NOT test for the novel  Coronavirus (2019 nCoV)    Coronavirus HKU1 NOT DETECTED NOT DETECTED Final   Coronavirus NL63 NOT DETECTED NOT DETECTED Final   Coronavirus OC43 NOT  DETECTED NOT DETECTED Final   Metapneumovirus NOT DETECTED NOT DETECTED Final   Rhinovirus / Enterovirus NOT DETECTED NOT DETECTED Final   Influenza A NOT DETECTED NOT DETECTED Final   Influenza B NOT DETECTED NOT DETECTED Final   Parainfluenza Virus 1 NOT DETECTED NOT DETECTED Final   Parainfluenza Virus 2 NOT DETECTED NOT DETECTED Final   Parainfluenza Virus 3 NOT DETECTED NOT DETECTED Final   Parainfluenza Virus 4 NOT DETECTED NOT DETECTED Final   Respiratory Syncytial Virus NOT DETECTED NOT DETECTED Final   Bordetella pertussis NOT DETECTED NOT DETECTED Final   Bordetella Parapertussis NOT DETECTED NOT DETECTED Final   Chlamydophila pneumoniae NOT DETECTED NOT DETECTED Final   Mycoplasma pneumoniae NOT DETECTED NOT DETECTED Final    Comment: Performed at [facility], [place] [address] [place], [place] [address]  MRSA PCR Screening     Status: None   Collection Time: 01/06/21  4:01 AM   Specimen: Urine, Clean Catch; Nasopharyngeal  Result Value Ref Range Status   MRSA by PCR NEGATIVE NEGATIVE Final    Comment:        The GeneXpert MRSA Assay (FDA approved for NASAL specimens only), is one component of a comprehensive MRSA colonization surveillance program. It is not intended to diagnose MRSA infection nor to guide or monitor treatment for MRSA infections. Performed at [facility], [address], [place], [place] [address]          Radiology Studies: DG Chest [place] 1 View  Result Date: 01/10/2021 CLINICAL DATA:  Pneumonia, lesion or disease EXAM: PORTABLE CHEST 1 VIEW COMPARISON:  01/05/2021 FINDINGS: Mild bibasilar airspace disease. No pleural effusion or pneumothorax. Heart and  mediastinal contours are unremarkable. No acute osseous abnormality. IMPRESSION: Mild bibasilar airspace disease concerning for pneumonia. Electronically Signed   By: Kathreen Devoid   On: 01/10/2021 13:15        Scheduled Meds: . acidophilus  1 capsule Oral Daily  . citalopram  20 mg Oral Daily  . enoxaparin (LOVENOX) injection  0.5 mg/kg Subcutaneous Q24H  . furosemide  40 mg Intravenous Daily  . gabapentin  300 mg Oral QHS  . irbesartan  150 mg Oral Daily   And  . hydrochlorothiazide  12.5 mg Oral Daily  . insulin aspart  0-5 Units Subcutaneous QHS  . insulin aspart  0-9 Units Subcutaneous TID WC  . insulin glargine  20 Units Subcutaneous Daily  . ipratropium-albuterol  3 mL Nebulization TID  . mometasone-formoterol  2 puff Inhalation BID  . multivitamin with minerals  1 tablet Oral Daily  . potassium chloride  20 mEq Oral BID  . Ensure Max Protein  11 oz Oral BID  . sodium chloride flush  3 mL Intravenous Q12H   Continuous Infusions: . sodium chloride Stopped (01/10/21 1919)  . levofloxacin (LEVAQUIN) IV Stopped (01/10/21 1854)     LOS: 5 days    Time spent: 34 minutes    Sharen Hones, MD [facility]   To contact the attending provider between 7A-7P or the covering provider during after hours 7P-7A, please log into the web site [email or web address] and access using universal [place] password for that web site. If you do not have the password, please call the hospital operator.  01/11/2021, 9:11 AM

## 2021-01-11 NOTE — Progress Notes (Signed)
Date of Admission:  01/05/2021     ID: Jermaine Harris is a 60 y.o. male  Active Problems:   Essential hypertension   PNA (pneumonia)   Acute respiratory failure (HCC)   Acute on chronic diastolic CHF (congestive heart failure) (HCC)   AKI (acute kidney injury) ([place])    Subjective: Feeling better Walked with PT in the corridor Still needing 9 liters of oxygen but down from 15L No fever Appetite poor  Medications:  . acidophilus  1 capsule Oral Daily  . citalopram  20 mg Oral Daily  . enoxaparin (LOVENOX) injection  0.5 mg/kg Subcutaneous Q24H  . furosemide  40 mg Intravenous Daily  . gabapentin  300 mg Oral QHS  . irbesartan  150 mg Oral Daily   And  . hydrochlorothiazide  12.5 mg Oral Daily  . insulin aspart  0-5 Units Subcutaneous QHS  . insulin aspart  0-9 Units Subcutaneous TID WC  . insulin glargine  20 Units Subcutaneous Daily  . ipratropium-albuterol  3 mL Nebulization TID  . mometasone-formoterol  2 puff Inhalation BID  . multivitamin with minerals  1 tablet Oral Daily  . potassium chloride  20 mEq Oral BID  . Ensure Max Protein  11 oz Oral BID  . sodium chloride flush  3 mL Intravenous Q12H    Objective: Patient Vitals for the past 24 hrs:  BP Temp Temp src Pulse Resp SpO2 Weight  01/11/21 0755 (!) 107/58 98.3 F (36.8 C) Oral 65 16 99 % -  01/11/21 0339 123/74 97.7 F (36.5 C) Oral 76 16 92 % 91.8 kg  01/10/21 2314 123/63 98.2 F (36.8 C) - 82 (!) 22 92 % -  01/10/21 2120 - - - - - 97 % -  01/10/21 2003 135/77 98 F (36.7 C) Oral 89 20 94 % -  01/10/21 1950 - - - - - 93 % -  01/10/21 1617 131/78 98.3 F (36.8 C) Oral 87 19 91 % -   PHYSICAL EXAM:  General: Alert, cooperative, no distress, comfortable at rest Head: Normocephalic, without obvious abnormality, atraumatic. Eyes: Conjunctivae clear, anicteric sclerae. Pupils are equal ENT Nares normal. No drainage or sinus tenderness. Lips, mucosa, and tongue normal. No Thrush Neck: Supple,  symmetrical, no adenopathy, thyroid: non tender no carotid bruit and no JVD. Back: No CVA tenderness. Lungs: b/l air entry crepts bases. Heart: Regular rate and rhythm, no murmur, rub or gallop. Abdomen: Soft, non-tender,not distended. Bowel sounds normal. No masses Extremities: atraumatic, no cyanosis. No edema. No clubbing Skin: No rashes or lesions. Or bruising Lymph: Cervical, supraclavicular normal. Neurologic: Grossly non-focal  Lab Results Recent Labs    01/10/21 0354 01/11/21 0336  WBC 7.9 8.6  HGB 10.2* 11.1*  HCT 31.2* 34.5*  NA 136 137  K 4.4 4.3  CL 105 103  CO2 24 27  BUN 18 24*  CREATININE 0.97 1.15   Liver Panel Recent Labs    01/10/21 0354 01/11/21 0336  PROT 5.5* 6.0*  ALBUMIN 2.4* 2.7*  AST 39 30  ALT 58* 60*  ALKPHOS 51 54  BILITOT 0.6 0.6    Microbiology: Legionella antigen in the urine is positive. Studies/Results: DG Chest [place] 1 View  Result Date: 01/10/2021 CLINICAL DATA:  Pneumonia, lesion or disease EXAM: PORTABLE CHEST 1 VIEW COMPARISON:  01/05/2021 FINDINGS: Mild bibasilar airspace disease. No pleural effusion or pneumothorax. Heart and mediastinal contours are unremarkable. No acute osseous abnormality. IMPRESSION: Mild bibasilar airspace disease concerning for pneumonia. Electronically Signed  By: Kathreen Devoid   On: 01/10/2021 13:15     Assessment/Plan: Acute respiratory illness with bilateral pneumonia secondary to Legionella.  Patient is currently on IV Levaquin.  Will not change to p.o. unless his appetite improves and is able to have a full meal.  Day 6 of antibiotic.  We will give at least for total of 10 days.  Acute hypoxic respiratory failure.  Was on high flow oxygen.  Now this nasal cannula oxygen is 9 L. Steroids have been stopped. Also has acute on chronic diastolic congestive heart failure.  Has received 2 doses of IV Lasix.  Anxiety.  At baseline but worsened with steroids which has been stopped  Diabetes mellitus  on insulin.  Discussed the management with the patient and his wife

## 2021-01-11 NOTE — Progress Notes (Signed)
Physical Therapy Treatment Patient Details Name: Jermaine Harris MRN: [id number] DOB: 06/30/1961 Today's Date: 01/11/2021    History of Present Illness 60 y.o. male with a history of HTN, DM presents with fever of 1 weeks duration cough and worsening sob of 2 days after traveling from [place].  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 Left on 1/13 returned to the [place] on 12/26/20. He was found to be acutely hypoxemic in respiratory distress requiring HFNC.    PT Comments    Pt is making good progress towards goals. INitially on 12L of HFNC, however performed all mobility on 10L with sats WNL. Left on 10L with RN aware at end of session. Pt wanted to focus on ambulation this date. Slow speed throughout and educated about energy conservation and [employer]. Will continue to progress as able.   Follow Up Recommendations  No PT follow up (appropraite for [facility] program)     Equipment Recommendations  None recommended by PT    Recommendations for Other Services       Precautions / Restrictions Precautions Precautions: None Restrictions Weight Bearing Restrictions: No    Mobility  Bed Mobility Overal bed mobility: Independent             General bed mobility comments: safe technique  Transfers Overall transfer level: Independent Equipment used: None             General transfer comment: no LOB noted. All mobility performed on 10L of O2 (due to constraints of O2 tank)  Ambulation/Gait Ambulation/Gait assistance: Supervision Gait Distance (Feet): 1000 Feet Assistive device: None Gait Pattern/deviations: Step-through pattern Gait velocity: decreased   General Gait Details: reciprocal and fluid gait pattern with slow speed. Noted to initially have increased sway, however improved with further distance. O2 tank on 10L with sats ranging from 92-100%. 1 brief standing rest break. HR in 120s throughout   Stairs              Wheelchair Mobility    Modified Rankin (Stroke Patients Only)       Balance Overall balance assessment: Mild deficits observed, not formally tested                                          Cognition Arousal/Alertness: Awake/alert Behavior During Therapy: WFL for tasks assessed/performed Overall Cognitive Status: Within Functional Limits for tasks assessed                                        Exercises      General Comments        Pertinent Vitals/Pain Pain Assessment: No/denies pain    Home Living                      Prior Function            PT Goals (current goals can now be found in the care plan section) Acute Rehab PT Goals Patient Stated Goal: to go home PT Goal Formulation: With patient Time For Goal Achievement: 01/23/21 Potential to Achieve Goals: Good Progress towards PT goals: Progressing toward goals    Frequency    Min 2X/week      PT Plan Current plan remains appropriate  Co-evaluation              AM-PAC PT "6 Clicks" Mobility   Outcome Measure  Help needed turning from your back to your side while in a flat bed without using bedrails?: None Help needed moving from lying on your back to sitting on the side of a flat bed without using bedrails?: None Help needed moving to and from a bed to a chair (including a wheelchair)?: None Help needed standing up from a chair using your arms (e.g., wheelchair or bedside chair)?: None Help needed to walk in hospital room?: None Help needed climbing 3-5 steps with a railing? : None 6 Click Score: 24    End of Session Equipment Utilized During Treatment: Oxygen Activity Tolerance: Patient tolerated treatment well;Patient limited by fatigue Patient left: in bed;with nursing/sitter in room Nurse Communication: Mobility status PT Visit Diagnosis: Muscle weakness (generalized) (M62.81);Difficulty in walking, not elsewhere classified  (R26.2)     Time: 6433-2951 PT Time Calculation (min) (ACUTE ONLY): 24 min  Charges:  $Gait Training: 23-37 mins                     Jermaine Harris, [place], DPT [phone]    Jermaine Harris 01/11/2021, 12:14 PM

## 2021-01-12 ENCOUNTER — Inpatient Hospital Stay: Payer: BC Managed Care – PPO

## 2021-01-12 ENCOUNTER — Other Ambulatory Visit: Payer: Self-pay | Admitting: Family Medicine

## 2021-01-12 ENCOUNTER — Encounter: Payer: Self-pay | Admitting: Internal Medicine

## 2021-01-12 DIAGNOSIS — I5033 Acute on chronic diastolic (congestive) heart failure: Secondary | ICD-10-CM | POA: Diagnosis not present

## 2021-01-12 DIAGNOSIS — J9601 Acute respiratory failure with hypoxia: Secondary | ICD-10-CM | POA: Diagnosis not present

## 2021-01-12 DIAGNOSIS — I10 Essential (primary) hypertension: Secondary | ICD-10-CM

## 2021-01-12 DIAGNOSIS — A481 Legionnaires' disease: Secondary | ICD-10-CM | POA: Diagnosis not present

## 2021-01-12 DIAGNOSIS — N179 Acute kidney failure, unspecified: Secondary | ICD-10-CM | POA: Diagnosis not present

## 2021-01-12 DIAGNOSIS — J189 Pneumonia, unspecified organism: Secondary | ICD-10-CM | POA: Diagnosis not present

## 2021-01-12 LAB — BASIC METABOLIC PANEL
Anion gap: 9 (ref 5–15)
BUN: 29 mg/dL — ABNORMAL HIGH (ref 6–20)
CO2: 26 mmol/L (ref 22–32)
Calcium: 8.5 mg/dL — ABNORMAL LOW (ref 8.9–10.3)
Chloride: 101 mmol/L (ref 98–111)
Creatinine, Ser: 1.26 mg/dL — ABNORMAL HIGH (ref 0.61–1.24)
GFR, Estimated: >60 mL/min (ref >60)
Glucose, Bld: 153 mg/dL — ABNORMAL HIGH (ref 70–99)
Potassium: 4.7 mmol/L (ref 3.5–5.1)
Sodium: 136 mmol/L (ref 135–145)

## 2021-01-12 LAB — LEGIONELLA PNEUMOPHILA SEROGP 1 UR AG: L. pneumophila Serogp 1 Ur Ag: POSITIVE — AB

## 2021-01-12 LAB — BRAIN NATRIURETIC PEPTIDE: B Natriuretic Peptide: 23.8 pg/mL (ref 0.0–100.0)

## 2021-01-12 LAB — GLUCOSE, CAPILLARY
Glucose-Capillary: 147 mg/dL — ABNORMAL HIGH (ref 70–99)
Glucose-Capillary: 142 mg/dL — ABNORMAL HIGH (ref 70–99)
Glucose-Capillary: 119 mg/dL — ABNORMAL HIGH (ref 70–99)
Glucose-Capillary: 117 mg/dL — ABNORMAL HIGH (ref 70–99)

## 2021-01-12 LAB — MAGNESIUM: Magnesium: 2.4 mg/dL (ref 1.7–2.4)

## 2021-01-12 LAB — PHOSPHORUS: Phosphorus: 4 mg/dL (ref 2.5–4.6)

## 2021-01-12 MED ORDER — IOHEXOL 350 MG/ML SOLN
75.0000 mL | Freq: Once | INTRAVENOUS | Status: AC | PRN
  Administered 2021-01-12: 75 mL via INTRAVENOUS

## 2021-01-12 NOTE — Telephone Encounter (Signed)
Pharmacy requests refill on: Metformin 500 mg 24 hr   LAST REFILL: 10/13/2020 (Q-30, R-3) LAST OV: 10/13/2020 NEXT OV: 02/09/2021 PHARMACY: [id number] [place], [place]  Hgb A1C (06/25/2020): 7.1  Earliest Fill Date: 02/10/2021

## 2021-01-12 NOTE — Progress Notes (Signed)
Mobility Specialist - Progress Note   01/12/21 1600  Mobility  Activity Contraindicated/medical hold  Mobility performed by Mobility specialist    Pt eating lunch at time of attempt. Will hold off and attempt session at another date/time.    Kathee Delton Mobility Specialist 01/12/21, 4:49 PM

## 2021-01-12 NOTE — Progress Notes (Signed)
PROGRESS NOTE    Jermaine Harris  DJS:[id number] DOB: [birth year] DOA: 01/05/2021 PCP: Ria Bush, MD   Chief complaint.  Shortness of breath. 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 Patient is not sure whether any of other members have the same symptoms. Patient also reported lightheadedness and dizziness with shortness of breath. He is fully vaccinated. Infectious disease and pulmonology consulted.His presentation is consistent with Legionella pneumonia. Legionella antigen +. Antibiotics changed to Levaquin. Patient was giving IV Lasix 2/6, he had a BMP elevation of 482.  Echocardiogram showed normal ejection fraction with a right ventricular pressure 36.4.   Assessment & Plan:   Active Problems:   Essential hypertension   PNA (pneumonia)   Acute respiratory failure (HCC)   Acute on chronic diastolic CHF (congestive heart failure) ([place])   AKI (acute kidney injury) ([place])  #1. Acute respiratory failure with hypoxemia secondary to pneumonia. Legionella pneumonia. Acute on chronic diastolic congestive heart failure.POA. Mild pulmonary hypertension. Patient still has significant hypoxemia, he had an episode of severe cough yesterday, oxygen was increased to 12 L again. BNP has normalized, no additional volume overload. Procalcitonin level 0.25, antibiotics effective. Patient could not tolerate CPAP, most likely has obstructive sleep apnea. I repeated chest x-ray today, report states that there is improvement at the left lower lobe, slightly worsening on the right lower lobe, possible small pleural  effusion.  I personally reviewed the image. Patient does not have any dysphagia or choking episode.  I do not suspect aspiration pneumonia at this time. We obtain CT angiogram of the chest to evaluate possible PE as well as right lower lobe pleural effusion. Continue wean oxygen.  #2.  Type 2 diabetes. Continue current regimen.  3.  Acute kidney injury Renal function improved.     DVT prophylaxis: Lovenox Code Status: Full Family Communication: Updated wife again yesterday afternoon. Disposition Plan:  .   Status is: Inpatient  Remains inpatient appropriate because:Inpatient level of care appropriate due to severity of illness   Dispo: The patient is from: Home              Anticipated d/c is to: Home              Anticipated d/c date is: > 3 days              Patient currently is not medically stable to d/c.   Difficult to place patient No        I/O last 3 completed shifts: In: 805.6 [P.O.:720; I.V.:7.1; IV Piggyback:78.4] Out: 3050 [Urine:3050] Total I/O In: 3 [I.V.:3] Out: 76 [Urine:475]     Consultants:   Pulmonology  Procedures: None  Antimicrobials: Levaquin  Subjective: Patient currently on 12 L oxygen.  He states that he had a spell at 7 PM yesterday, he was placed on high flow oxygen. He slept well last night on the facial mask.  Currently, he does not feel segment short of breath.  He no longer has a cough. Denies any fever chills per No abdominal pain or nausea vomiting. No dysuria or hematuria.  Objective: Vitals:   01/11/21 2049 01/11/21 2300 01/12/21 0435 01/12/21 0932  BP: 128/83  98/60   Pulse:  91  68   Resp: 20 (!) 23 20   Temp: 98.5 F (36.9 C)  98.4 F (36.9 C)   TempSrc: Oral  Oral   SpO2: 97%  99% 95%  Weight:   92 kg   Height:        Intake/Output Summary (Last 24 hours) at 01/12/2021 0946 Last data filed at 01/12/2021 0934 Gross per 24 hour  Intake 483 ml  Output 2575 ml  Net -2092 ml   Filed Weights   01/10/21  0525 01/11/21 0339 01/12/21 0435  Weight: 97.2 kg 91.8 kg 92 kg    Examination:  General exam: Appears calm and comfortable  Respiratory system: Clear to auscultation. Respiratory effort normal. Cardiovascular system: S1 & S2 heard, RRR. No JVD, murmurs, rubs, gallops or clicks. No pedal edema. Gastrointestinal system: Abdomen is nondistended, soft and nontender. No organomegaly or masses felt. Normal bowel sounds heard. Central nervous system: Alert and oriented. No focal neurological deficits. Extremities: Symmetric Skin: No rashes, lesions or ulcers Psychiatry: Judgement and insight appear normal. Mood & affect appropriate.     Data Reviewed: I have personally reviewed following labs and imaging studies  CBC: Recent Labs  Lab 01/05/21 1428 01/06/21 0748 01/07/21 0447 01/08/21 0344 01/09/21 0244 01/10/21 0354 01/11/21 0336  WBC 10.4   < > 10.5 13.7* 9.7 7.9 8.6  NEUTROABS 8.3*  --   --   --   --   --   --   HGB 11.5*   < > 10.2* 9.8* 9.2* 10.2* 11.1*  HCT 33.5*   < > 31.5* 29.4* 28.4* 31.2* 34.5*  MCV 82.7   < > 84.9 84.5 85.0 84.8 86.0  PLT 208   < > 258 300 354 361 392   < > = values in this interval not displayed.   Basic Metabolic Panel: Recent Labs  Lab 01/08/21 0344 01/09/21 0244 01/10/21 0354 01/11/21 0336 01/12/21 0631  NA 136 135 136 137 136  K 4.8 4.2 4.4 4.3 4.7  CL 105 106 105 103 101  CO2 22 21* 24 27 26   GLUCOSE 262* 192* 120* 153* 153*  BUN 37* 30* 18 24* 29*  CREATININE 1.12 0.91 0.97 1.15 1.26*  CALCIUM 8.1* 7.7* 8.2* 8.3* 8.5*  MG 2.5* 2.0 2.1 2.1 2.4  PHOS 3.1 3.3 4.7* 4.2 4.0   GFR: Estimated Creatinine Clearance: 67.7 mL/min (A) (by C-G formula based on SCr of 1.26 mg/dL (H)). Liver Function Tests: Recent Labs  Lab 01/07/21 0447 01/08/21 0344 01/09/21 0244 01/10/21 0354 01/11/21 0336  AST 44* 27 48* 39 30  ALT 57* 44 63* 58* 60*  ALKPHOS 62 60 52 51 54  BILITOT 0.7 0.4 0.6 0.6 0.6  PROT 6.0* 5.6* 5.6* 5.5* 6.0*  ALBUMIN  2.5* 2.3* 2.4* 2.4* 2.7*   No results for input(s): LIPASE, AMYLASE in the last 168 hours. No results for input(s): AMMONIA in the last 168 hours. Coagulation Profile: No results for input(s): INR, PROTIME in the last 168 hours. Cardiac Enzymes: No results for input(s): CKTOTAL, CKMB, CKMBINDEX, TROPONINI in the last 168 hours. BNP (last 3 results) No results for input(s): PROBNP in the last 8760 hours. HbA1C: No results for input(s): HGBA1C in the last 72 hours. CBG: Recent Labs  Lab 01/11/21 1103 01/11/21 1517 01/11/21 1843 01/11/21 2051 01/12/21 0838  GLUCAP 187* 202* 166* 162* 147*   Lipid Profile: No results for input(s): CHOL, HDL, LDLCALC, TRIG, CHOLHDL, LDLDIRECT in the last 72 hours. Thyroid Function Tests: No  results for input(s): TSH, T4TOTAL, FREET4, T3FREE, THYROIDAB in the last 72 hours. Anemia Panel: No results for input(s): VITAMINB12, FOLATE, FERRITIN, TIBC, IRON, RETICCTPCT in the last 72 hours. Sepsis Labs: Recent Labs  Lab 01/05/21 1426 01/05/21 1428 01/06/21 0748 01/07/21 0447 01/08/21 0344 01/09/21 0244  PROCALCITON  --    < > 0.73 0.71 0.57 0.24  LATICACIDVEN 1.3  --   --   --   --   --    < > = values in this interval not displayed.    Recent Results (from the past 240 hour(s))  Culture, blood (single) w Reflex to ID Panel     Status: None   Collection Time: 01/04/21  1:18 PM   Specimen: Blood  Result Value Ref Range Status   MICRO NUMBER: [id number]  Final   SPECIMEN QUALITY: Suboptimal  Final   Source NOT GIVEN  Final   STATUS: FINAL  Final   Result:   Final    No growth after 5 days Inspection of blood culture bottles indicates that an inadequate volume of blood may have been collected for the detection of sepsis.   COMMENT: Aerobic and anaerobic bottle received.  Final  Culture, blood (single) w Reflex to ID Panel     Status: None   Collection Time: 01/04/21  1:18 PM   Specimen: Blood  Result Value Ref Range Status   MICRO NUMBER:  [id number]  Final   SPECIMEN QUALITY: Suboptimal  Final   Source BLOOD  Final   STATUS: FINAL  Final   Result:   Final    No growth after 5 days Inspection of blood culture bottles indicates that an inadequate volume of blood may have been collected for the detection of sepsis.   COMMENT: Aerobic and anaerobic bottle received.  Final  SARS Coronavirus 2 by RT PCR (hospital order, performed in [facility] hospital lab) Nasopharyngeal Nasopharyngeal Swab     Status: None   Collection Time: 01/05/21  2:32 PM   Specimen: Nasopharyngeal Swab  Result Value Ref Range Status   SARS Coronavirus 2 NEGATIVE NEGATIVE Final    Comment: (NOTE) SARS-CoV-2 target nucleic acids are NOT DETECTED.  The SARS-CoV-2 RNA is generally detectable in upper and lower respiratory specimens during the acute phase of infection. The lowest concentration of SARS-CoV-2 viral copies this assay can detect is 250 copies / mL. A negative result does not preclude SARS-CoV-2 infection and should not be used as the sole basis for treatment or other patient management decisions.  A negative result may occur with improper specimen collection / handling, submission of specimen other than nasopharyngeal swab, presence of viral mutation(s) within the areas targeted by this assay, and inadequate number of viral copies (<250 copies / mL). A negative result must be combined with clinical observations, patient history, and epidemiological information.  Fact Sheet for Patients:   StrictlyIdeas.no  Fact Sheet for Healthcare Providers: BankingDealers.co.za  This test is not yet approved or  cleared by the [place] FDA and has been authorized for detection and/or diagnosis of SARS-CoV-2 by FDA under an Emergency Use Authorization (EUA).  This EUA will remain in effect (meaning this test can be used) for the duration of the COVID-19 declaration under Section 564(b)(1) of the Act, 21  U.S.C. section 360bbb-3(b)(1), unless the authorization is terminated or revoked sooner.  Performed at [facility], [address] [place], [place] [address]   Respiratory (~20 pathogens) panel by PCR     Status: None  Collection Time: 01/05/21  5:14 PM   Specimen: Flu Kit Nasopharyngeal Swab; Respiratory  Result Value Ref Range Status   Adenovirus NOT DETECTED NOT DETECTED Final   Coronavirus 229E NOT DETECTED NOT DETECTED Final    Comment: (NOTE) The Coronavirus on the Respiratory Panel, DOES NOT test for the novel  Coronavirus (2019 nCoV)    Coronavirus HKU1 NOT DETECTED NOT DETECTED Final   Coronavirus NL63 NOT DETECTED NOT DETECTED Final   Coronavirus OC43 NOT DETECTED NOT DETECTED Final   Metapneumovirus NOT DETECTED NOT DETECTED Final   Rhinovirus / Enterovirus NOT DETECTED NOT DETECTED Final   Influenza A NOT DETECTED NOT DETECTED Final   Influenza B NOT DETECTED NOT DETECTED Final   Parainfluenza Virus 1 NOT DETECTED NOT DETECTED Final   Parainfluenza Virus 2 NOT DETECTED NOT DETECTED Final   Parainfluenza Virus 3 NOT DETECTED NOT DETECTED Final   Parainfluenza Virus 4 NOT DETECTED NOT DETECTED Final   Respiratory Syncytial Virus NOT DETECTED NOT DETECTED Final   Bordetella pertussis NOT DETECTED NOT DETECTED Final   Bordetella Parapertussis NOT DETECTED NOT DETECTED Final   Chlamydophila pneumoniae NOT DETECTED NOT DETECTED Final   Mycoplasma pneumoniae NOT DETECTED NOT DETECTED Final    Comment: Performed at [facility], [place]. [address], [place], [place] [address]  MRSA PCR Screening     Status: None   Collection Time: 01/06/21  4:01 AM   Specimen: Urine, Clean Catch; Nasopharyngeal  Result Value Ref Range Status   MRSA by PCR NEGATIVE NEGATIVE Final    Comment:        The GeneXpert MRSA Assay (FDA approved for NASAL specimens only), is one component of a comprehensive MRSA colonization surveillance program. It is not intended to diagnose  MRSA infection nor to guide or monitor treatment for MRSA infections. Performed at [facility], [address], [place], [place] [address]          Radiology Studies: [facility] Chest [place] 1 View  Result Date: 01/12/2021 CLINICAL DATA:  Acute hypoxemic respiratory failure. EXAM: PORTABLE CHEST 1 VIEW COMPARISON:  January 10, 2021. FINDINGS: Bibasilar opacities, slightly improved on the left and worse on the right. Suspected new small right pleural effusion. No visible pneumothorax or left-sided pleural effusion. Similar cardiomediastinal silhouette. No acute osseous abnormality. IMPRESSION: Bibasilar opacities, slightly improved on the left and worse on the right. Suspected new small right pleural effusion. Electronically Signed   By: Margaretha Sheffield MD   On: 01/12/2021 08:21   DG Chest Port 1 View  Result Date: 01/10/2021 CLINICAL DATA:  Pneumonia, lesion or disease EXAM: PORTABLE CHEST 1 VIEW COMPARISON:  01/05/2021 FINDINGS: Mild bibasilar airspace disease. No pleural effusion or pneumothorax. Heart and mediastinal contours are unremarkable. No acute osseous abnormality. IMPRESSION: Mild bibasilar airspace disease concerning for pneumonia. Electronically Signed   By: Kathreen Devoid   On: 01/10/2021 13:15        Scheduled Meds: . acidophilus  1 capsule Oral Daily  . citalopram  20 mg Oral Daily  . enoxaparin (LOVENOX) injection  0.5 mg/kg Subcutaneous Q24H  . gabapentin  300 mg Oral QHS  . insulin aspart  0-5 Units Subcutaneous QHS  . insulin aspart  0-9 Units Subcutaneous TID WC  . insulin glargine  20 Units Subcutaneous Daily  . ipratropium-albuterol  3 mL Nebulization TID  . irbesartan  150 mg Oral Daily  . mometasone-formoterol  2 puff Inhalation BID  . multivitamin with minerals  1 tablet Oral Daily  .  potassium chloride  20 mEq Oral BID  . Ensure Max Protein  11 oz Oral BID  . sodium chloride flush  3 mL Intravenous Q12H   Continuous Infusions: . sodium chloride  Stopped (01/10/21 1919)  . levofloxacin (LEVAQUIN) IV 750 mg (01/11/21 1646)     LOS: 6 days    Time spent: 35 minutes    Sharen Hones, MD [facility]   To contact the attending provider between 7A-7P or the covering provider during after hours 7P-7A, please log into the web site [email or web address] and access using universal  password for that web site. If you do not have the password, please call the hospital operator.  01/12/2021, 9:46 AM

## 2021-01-12 NOTE — Progress Notes (Signed)
Date of Admission:  01/05/2021      ID: Jermaine Harris is a 60 y.o. male  Active Problems:   Essential hypertension   PNA (pneumonia)   Acute respiratory failure (HCC)   Acute on chronic diastolic CHF (congestive heart failure) (HCC)   AKI (acute kidney injury) ([place])    Subjective: Feeling better today Yesterday had a panic attack which made his breathing worse Got xanax and had a good night sleep  Medications:   acidophilus  1 capsule Oral Daily   citalopram  20 mg Oral Daily   enoxaparin (LOVENOX) injection  0.5 mg/kg Subcutaneous Q24H   gabapentin  300 mg Oral QHS   insulin aspart  0-5 Units Subcutaneous QHS   insulin aspart  0-9 Units Subcutaneous TID WC   insulin glargine  20 Units Subcutaneous Daily   ipratropium-albuterol  3 mL Nebulization TID   irbesartan  150 mg Oral Daily   mometasone-formoterol  2 puff Inhalation BID   Ensure Max Protein  11 oz Oral BID   sodium chloride flush  3 mL Intravenous Q12H    Objective: Vital signs in last 24 hours: Temp:  [98.2 F (36.8 C)-98.6 F (37 C)] 98.2 F (36.8 C) (02/08 1133) Pulse Rate:  [68-91] 79 (02/08 1133) Resp:  [16-23] 20 (02/08 0435) BP: (98-128)/(60-83) 109/67 (02/08 1133) SpO2:  [95 %-99 %] 97 % (02/08 1431) Weight:  [92 kg] 92 kg (02/08 0435)  PHYSICAL EXAM:  General: Alert, cooperative, no distress, appears stated age.  Head: Normocephalic, without obvious abnormality, atraumatic. Eyes: Conjunctivae clear, anicteric sclerae. Pupils are equal ENT Nares normal. No drainage or sinus tenderness. Lips, mucosa, and tongue normal. No Thrush Neck: Supple, symmetrical, no adenopathy, thyroid: non tender no carotid bruit and no JVD. Back: No CVA tenderness. Lungs: b/l air entry crepts bases. Heart: Regular rate and rhythm, no murmur, rub or gallop. Abdomen: Soft, non-tender,not distended. Bowel sounds normal. No masses Extremities: atraumatic, no cyanosis. No edema. No clubbing Skin: No rashes  or lesions. Or bruising Lymph: Cervical, supraclavicular normal. Neurologic: Grossly non-focal  Lab Results Recent Labs    01/10/21 0354 01/11/21 0336 01/12/21 0631  WBC 7.9 8.6  --   HGB 10.2* 11.1*  --   HCT 31.2* 34.5*  --   NA 136 137 136  K 4.4 4.3 4.7  CL 105 103 101  CO2 24 27 26   BUN 18 24* 29*  CREATININE 0.97 1.15 1.26*   Liver Panel Recent Labs    01/10/21 0354 01/11/21 0336  PROT 5.5* 6.0*  ALBUMIN 2.4* 2.7*  AST 39 30  ALT 58* 60*  ALKPHOS 51 54  BILITOT 0.6 0.6    Studies/Results: CT ANGIO CHEST PE W OR WO CONTRAST  Result Date: 01/12/2021 CLINICAL DATA:  Shortness of breath EXAM: CT ANGIOGRAPHY CHEST WITH CONTRAST TECHNIQUE: Multidetector CT imaging of the chest was performed using the standard protocol during bolus administration of intravenous contrast. Multiplanar CT image reconstructions and MIPs were obtained to evaluate the vascular anatomy. CONTRAST:  75mL OMNIPAQUE IOHEXOL 350 MG/ML SOLN COMPARISON:  Chest x-ray from earlier in the same day. FINDINGS: Cardiovascular: Thoracic aorta and its branches demonstrate atherosclerotic calcification without aneurysmal dilatation or dissection. No cardiac enlargement is noted. The pulmonary artery shows a normal branching pattern. No filling defect is identified within the pulmonary artery to suggest pulmonary embolism. Mediastinum/Nodes: Thoracic inlet is within normal limits. No sizable hilar or mediastinal adenopathy is noted. The esophagus as visualized is within normal limits. Lungs/Pleura:  Lungs are well aerated bilaterally with mild bibasilar consolidation and small effusions. A few scattered calcified granulomas are noted. No pneumothorax is seen. Upper Abdomen: No acute abnormality. Musculoskeletal: No acute bony abnormality is noted. Review of the MIP images confirms the above findings. IMPRESSION: Bibasilar consolidation with small effusions. No evidence of pulmonary emboli. Changes of prior granulomatous  disease. Electronically Signed   By: Inez Catalina M.D.   On: 01/12/2021 11:34   DG Chest Port 1 View  Result Date: 01/12/2021 CLINICAL DATA:  Acute hypoxemic respiratory failure. EXAM: PORTABLE CHEST 1 VIEW COMPARISON:  January 10, 2021. FINDINGS: Bibasilar opacities, slightly improved on the left and worse on the right. Suspected new small right pleural effusion. No visible pneumothorax or left-sided pleural effusion. Similar cardiomediastinal silhouette. No acute osseous abnormality. IMPRESSION: Bibasilar opacities, slightly improved on the left and worse on the right. Suspected new small right pleural effusion. Electronically Signed   By: Margaretha Sheffield MD   On: 01/12/2021 08:21     Assessment/Plan:  Bilateral Legionella pneumonia involving the lower lobes.  Acute hypoxic respiratory failure secondary to Legionella pneumonia Currently on 8 L of oxygen.  He is feeling better He is on day 7 off antibiotic.  We will give Levaquin until 01/15/2021  Diabetes mellitus on insulin  Anxiety on Xanax Discussed the management with the patient and his wife in detail.

## 2021-01-12 NOTE — Progress Notes (Signed)
Physical Therapy Treatment Patient Details Name: Jermaine Harris MRN: [id number] DOB: [birth year] Today's Date: 01/12/2021    History of Present Illness 59 y.o. male with a history of HTN, DM presents with fever of 1 weeks duration cough and worsening sob of 2 days after traveling from [place].  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 Left on 1/13 returned to the [place] on 12/26/20. He was found to be acutely hypoxemic in respiratory distress requiring HFNC.    PT Comments    Pt is making good progress towards goals, with ability to ambulate on decreased O2 this date. All mobility performed on 8L of O2 with O2 sats >90%. 1 short instance where they decreased to 89%, however able to improve quickly. HR ranged from 100-117bpm with exertion. Pt also ambulated to bathroom in room with independence. Very motivated to go home and wants to continue to challenge his O2 need. Encouraged him to continue to walk frequently and with mobility tech as well. No AD required.   Follow Up Recommendations  No PT follow up ([facility]works)     Equipment Recommendations  None recommended by PT    Recommendations for Other Services       Precautions / Restrictions Precautions Precautions: None Restrictions Weight Bearing Restrictions: No    Mobility  Bed Mobility Overal bed mobility: Independent             General bed mobility comments: safe technique  Transfers Overall transfer level: Independent Equipment used: None             General transfer comment: safe technique  Ambulation/Gait Ambulation/Gait assistance: Supervision Gait Distance (Feet): 2000 Feet Assistive device: None Gait Pattern/deviations: Step-through pattern     General Gait Details: safe technique with no unsteadiness noted. Does demonstrate slow speed with 1 standing rest break required. Vitals monitored throughout. All mobilty performed on 8L of O2. Vitals between 90-99%  with exertion.   Stairs             Wheelchair Mobility    Modified Rankin (Stroke Patients Only)       Balance Overall balance assessment: Mild deficits observed, not formally tested                                          Cognition Arousal/Alertness: Awake/alert Behavior During Therapy: WFL for tasks assessed/performed Overall Cognitive Status: Within Functional Limits for tasks assessed                                        Exercises      General Comments        Pertinent Vitals/Pain Pain Assessment: No/denies pain    Home Living                      Prior Function            PT Goals (current goals can now be found in the care plan section) Acute Rehab PT Goals Patient Stated Goal: to go home PT Goal Formulation: With patient Time For Goal Achievement: 01/23/21 Potential to Achieve Goals: Good Progress towards PT goals: Progressing toward goals    Frequency    Min 2X/week      PT Plan Current  plan remains appropriate    Co-evaluation              AM-PAC PT "6 Clicks" Mobility   Outcome Measure  Help needed turning from your back to your side while in a flat bed without using bedrails?: None Help needed moving from lying on your back to sitting on the side of a flat bed without using bedrails?: None Help needed moving to and from a bed to a chair (including a wheelchair)?: None Help needed standing up from a chair using your arms (e.g., wheelchair or bedside chair)?: None Help needed to walk in hospital room?: None Help needed climbing 3-5 steps with a railing? : None 6 Click Score: 24    End of Session Equipment Utilized During Treatment: Oxygen Activity Tolerance: Patient tolerated treatment well;Patient limited by fatigue Patient left: in bed Nurse Communication: Mobility status PT Visit Diagnosis: Muscle weakness (generalized) (M62.81);Difficulty in walking, not elsewhere  classified (R26.2)     Time: 2840-6986 PT Time Calculation (min) (ACUTE ONLY): 38 min  Charges:  $Gait Training: 38-52 mins                     Greggory Stallion, [place], DPT [phone]    Jermaine Harris 01/12/2021, 3:55 PM

## 2021-01-12 NOTE — Progress Notes (Signed)
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   He left for [place] on 12/17/20 and reaced there eon 12/18/20 , stayed in [place] for a week, mostly had meeting in the hotel and visited certain places like [employer] . He spent a day in [place] and returned to the [place] on 12/26/20.He was found to be acutely hypoxemic in respiratory distress requiring HFNC. During my interview he has been weaned to [place] at 15L/min  01/09/2021- patient is tired today, he reports inability to sleep overnight.  Discussed with Dr Dwyane Dee today and we agreed on stopping steroids today.  I have encouraged him to attempt PT and OT again today.  He is weaned to 13L/min.  Bloodwork stable with resolution of leukocytosis and trending down procalcitonin.   01/10/2021- Patient examined at bedside.  He is clinically imporved but remains on 13-15L/min [place].  He had spirometry done today.  Additionally he has workup in process to investigate cardiac function.  His bloodwork shows BMP is within reference range, CBC with resolution of leucocytosis. He continues to work with PT.    01/12/2021- Patient examined at bedside. Clinically imporved.  He was diuresed s/p 5L net negative.  He had O2 weaned from 15L to 35.  BMP is with prerenal AKI mild stage 1 likely treansient post diuretic.   He had repeat CT with PE protocol, improved infiltration of both lungs at bases with residual infiltrates worse at bases posteriorly.  He is working with  PT.  PAST MEDICAL HISTORY   Past Medical History:  Diagnosis Date  . Actinic keratosis   . Claustrophobia   . GAD (generalized anxiety disorder)    claustrophobia, some panic attacks  . HTN (hypertension)   . Irritable bowel syndrome   . Lumbar herniated disc 06/2015  . Seasonal allergies      SURGICAL HISTORY   Past Surgical History:  Procedure Laterality Date  . COLON SURGERY    . COLONOSCOPY  2012   mild diverticulosis, rec rpt 5 yrs 2/2 fmhx polyps  . COLONOSCOPY WITH PROPOFOL N/A 05/29/2019   TA, diverticulosis, rpt 5 yrs (Bonna Gains, Lennette Bihari, MD)  . MICRODISCECTOMY LUMBAR  07/2015   L4/5/S1 [place]on Gammon)  . TONSILLECTOMY       FAMILY HISTORY   Family History  Problem Relation Age of Onset  . Heart disease Father        pacer and CHF  . Depression Father   . Transient ischemic attack Mother 65       several  . Depression Mother   . Colon polyps Sister   . Sudden death Maternal Grandfather 74       ?mustard gas     SOCIAL HISTORY   Social History   Tobacco Use  . Smoking status: Never Smoker  . Smokeless tobacco: Never Used  Vaping Use  . Vaping Use:  Never used  Substance Use Topics  . Alcohol use: Yes    Alcohol/week: 0.0 standard drinks    Comment: couple of drinks per week  . Drug use: Never     MEDICATIONS    Home Medication:    Current Medication:  Current Facility-Administered Medications:  .  0.9 %  sodium chloride infusion, , Intravenous, PRN, Sharen Hones, MD, Stopped at 01/10/21 1919 .  acetaminophen (TYLENOL) tablet 650 mg, 650 mg, Oral, Q6H PRN, Athena Masse, MD, 650 mg at 01/10/21 1709 .  acidophilus (RISAQUAD) capsule 1 capsule, 1 capsule, Oral, Daily, Ravishankar, Joellyn Quails, MD, 1 capsule at 01/12/21 0928 .  albuterol (VENTOLIN HFA) 108 (90 Base) MCG/ACT inhaler 2 puff, 2 puff, Inhalation, Q6H PRN, Shawna Clamp, MD .  ALPRAZolam Duanne Moron) tablet 0.5 mg, 0.5 mg, Oral, Daily PRN, Cox, Amy N, DO, 0.5 mg at 01/11/21 2139 .   citalopram (CELEXA) tablet 20 mg, 20 mg, Oral, Daily, Cox, Amy N, DO, 20 mg at 01/12/21 0928 .  enoxaparin (LOVENOX) injection 47.5 mg, 0.5 mg/kg, Subcutaneous, Q24H, Cox, Amy N, DO, 47.5 mg at 01/11/21 2140 .  fentaNYL (SUBLIMAZE) injection 25 mcg, 25 mcg, Intravenous, Q4H PRN, Cox, Amy N, DO, 25 mcg at 01/06/21 0630 .  gabapentin (NEURONTIN) capsule 300 mg, 300 mg, Oral, QHS, Cox, Amy N, DO, 300 mg at 01/11/21 2139 .  insulin aspart (novoLOG) injection 0-5 Units, 0-5 Units, Subcutaneous, QHS, Cox, Amy N, DO, 3 Units at 01/07/21 2123 .  insulin aspart (novoLOG) injection 0-9 Units, 0-9 Units, Subcutaneous, TID WC, Cox, Amy N, DO, 1 Units at 01/12/21 0928 .  insulin glargine (LANTUS) injection 20 Units, 20 Units, Subcutaneous, Daily, Shawna Clamp, MD, 20 Units at 01/12/21 [phone] .  ipratropium-albuterol (DUONEB) 0.5-2.5 (3) MG/3ML nebulizer solution 3 mL, 3 mL, Nebulization, TID, Shawna Clamp, MD, 3 mL at 01/12/21 0932 .  irbesartan (AVAPRO) tablet 150 mg, 150 mg, Oral, Daily, 150 mg at 01/11/21 6440 **AND** [DISCONTINUED] hydrochlorothiazide (MICROZIDE) capsule 12.5 mg, 12.5 mg, Oral, Daily, Cox, Amy N, DO, 12.5 mg at 01/11/21 3474 .  levofloxacin (LEVAQUIN) IVPB 750 mg, 750 mg, Intravenous, Q24H, Ravishankar, Jayashree, MD, Last Rate: 100 mL/hr at 01/11/21 1646, 750 mg at 01/11/21 1646 .  metoprolol tartrate (LOPRESSOR) injection 5 mg, 5 mg, Intravenous, Q4H PRN, Cox, Amy N, DO .  mometasone-formoterol (DULERA) 100-5 MCG/ACT inhaler 2 puff, 2 puff, Inhalation, BID, Sharen Hones, MD, 2 puff at 01/12/21 [phone] .  multivitamin with minerals tablet 1 tablet, 1 tablet, Oral, Daily, Shawna Clamp, MD, 1 tablet at 01/12/21 [phone] .  ondansetron (ZOFRAN) tablet 4 mg, 4 mg, Oral, Q6H PRN **OR** ondansetron (ZOFRAN) injection 4 mg, 4 mg, Intravenous, Q6H PRN, Cox, Amy N, DO .  potassium chloride SA (KLOR-CON) CR tablet 20 mEq, 20 mEq, Oral, BID, Cox, Amy N, DO, 20 mEq at 01/12/21 0928 .  protein supplement  (ENSURE MAX) liquid, 11 oz, Oral, BID, Shawna Clamp, MD, 11 oz at 01/10/21 1248 .  sodium chloride (OCEAN) 0.65 % nasal spray 2 spray, 2 spray, Nasal, PRN, Tsosie Billing, MD, 2 spray at 01/10/21 0827 .  sodium chloride flush (NS) 0.9 % injection 3 mL, 3 mL, Intravenous, Q12H, Sharen Hones, MD, 3 mL at 01/12/21 0929 .  traZODone (DESYREL) tablet 50 mg, 50 mg, Oral, QHS PRN, Athena Masse, MD, 50 mg at 01/11/21 2139    ALLERGIES   Ace inhibitors     REVIEW OF SYSTEMS    Review of Systems:  Gen:  Denies  fever, sweats, chills weigh loss  HEENT: Denies blurred vision, double vision, ear pain, eye pain, hearing loss, nose bleeds, sore throat Cardiac:  No dizziness, chest pain or heaviness, chest tightness,edema Resp:   Reports severe dyspnea Gi: Denies swallowing difficulty, stomach pain, nausea or vomiting, diarrhea, constipation, bowel incontinence Gu:  Denies bladder incontinence, burning urine Ext:   Denies Joint pain, stiffness or swelling Skin: Denies  skin rash, easy bruising or bleeding or hives Endoc:  Denies polyuria, polydipsia , polyphagia or weight change Psych:   Denies depression, insomnia or hallucinations   Other:  All other systems negative   VS: BP 109/67 (BP Location: Right Arm)   Pulse 79   Temp 98.2 F (36.8 C) (Oral)   Resp 20   Ht 5' 7.25" (1.708 m)   Wt 92 kg   SpO2 96%   BMI 31.53 kg/m      PHYSICAL EXAM    GENERAL:NAD, no fevers, chills, no weakness no fatigue HEAD: Normocephalic, atraumatic.  EYES: Pupils equal, round, reactive to light. Extraocular muscles intact. No scleral icterus.  MOUTH: Moist mucosal membrane. Dentition intact. No abscess noted.  EAR, NOSE, THROAT: Clear without exudates. No external lesions.  NECK: Supple. No thyromegaly. No nodules. No JVD.  PULMONARY: decreased air entry bilaterally  CARDIOVASCULAR: S1 and S2. Regular rate and rhythm. No murmurs, rubs, or gallops. No edema. Pedal pulses 2+  bilaterally.  GASTROINTESTINAL: Soft, nontender, nondistended. No masses. Positive bowel sounds. No hepatosplenomegaly.  MUSCULOSKELETAL: No swelling, clubbing, or edema. Range of motion full in all extremities.  NEUROLOGIC: Cranial nerves II through XII are intact. No gross focal neurological deficits. Sensation intact. Reflexes intact.  SKIN: No ulceration, lesions, rashes, or cyanosis. Skin warm and dry. Turgor intact.  PSYCHIATRIC: Mood, affect within normal limits. The patient is awake, alert and oriented x 3. Insight, judgment intact.       IMAGING    DG Chest 2 View  Result Date: 01/05/2021 CLINICAL DATA:  Question normal sepsis.  Fever EXAM: CHEST - 2 VIEW COMPARISON:  01/04/2021 FINDINGS: Right lower lobe infiltrate posteriorly unchanged from the prior study. No significant pleural effusion. Mild left lower lobe airspace disease unchanged. Negative for heart failure IMPRESSION: Bibasilar infiltrates right greater than left unchanged. Electronically Signed   By: Franchot Gallo M.D.   On: 01/05/2021 15:14   DG Chest 2 View  Result Date: 01/05/2021 CLINICAL DATA:  Dry cough, fever for 7 8 days EXAM: CHEST - 2 VIEW COMPARISON:  None. FINDINGS: The heart size and mediastinal contours are within normal limits. Heterogeneous airspace opacity of the dependent right lower lobe. The visualized skeletal structures are unremarkable. IMPRESSION: Heterogeneous airspace opacity of the dependent right lower lobe, consistent with infection or aspiration. Recommend follow-up radiographs in 6-8 weeks to ensure complete radiographic resolution and exclude underlying malignancy. Electronically Signed   By: Eddie Candle M.D.   On: 01/05/2021 08:19   CT CHEST WO CONTRAST  Result Date: 01/06/2021 CLINICAL DATA:  Respiratory failure.  Fevers for 1 week. EXAM: CT CHEST WITHOUT CONTRAST TECHNIQUE: Multidetector CT imaging of the chest was performed following the standard protocol without IV contrast. COMPARISON:   None. FINDINGS: Cardiovascular: The heart size appears normal. Aortic atherosclerosis. No pericardial effusion. Mediastinum/Nodes: No enlarged mediastinal or axillary lymph nodes. Calcified mediastinal and hilar lymph nodes are identified compatible with chronic granulomatous disease. Thyroid gland, trachea, and esophagus demonstrate no significant findings. Lungs/Pleura: Dense airspace consolidation with air bronchograms are noted involving both lower lobes  consistent with multifocal pneumonia. Right middle lobe, lingula and both upper lobes appear clear. No pleural effusion or signs of interstitial edema. Involves bilateral lower lobes with ground Upper Abdomen: Diffuse hepatic steatosis. No acute findings identified within the imaged portions of the upper abdomen. Musculoskeletal: No chest wall mass or suspicious bone lesions identified. IMPRESSION: 1. Bilateral lower lobe airspace consolidation with air bronchograms consistent with multifocal pneumonia. 2. Hepatic steatosis. 3. Aortic atherosclerosis. Aortic Atherosclerosis (ICD10-I70.0). Electronically Signed   By: Kerby Moors M.D.   On: 01/06/2021 18:58   CT ANGIO CHEST PE W OR WO CONTRAST  Result Date: 01/12/2021 CLINICAL DATA:  Shortness of breath EXAM: CT ANGIOGRAPHY CHEST WITH CONTRAST TECHNIQUE: Multidetector CT imaging of the chest was performed using the standard protocol during bolus administration of intravenous contrast. Multiplanar CT image reconstructions and MIPs were obtained to evaluate the vascular anatomy. CONTRAST:  38mL OMNIPAQUE IOHEXOL 350 MG/ML SOLN COMPARISON:  Chest x-ray from earlier in the same day. FINDINGS: Cardiovascular: Thoracic aorta and its branches demonstrate atherosclerotic calcification without aneurysmal dilatation or dissection. No cardiac enlargement is noted. The pulmonary artery shows a normal branching pattern. No filling defect is identified within the pulmonary artery to suggest pulmonary embolism.  Mediastinum/Nodes: Thoracic inlet is within normal limits. No sizable hilar or mediastinal adenopathy is noted. The esophagus as visualized is within normal limits. Lungs/Pleura: Lungs are well aerated bilaterally with mild bibasilar consolidation and small effusions. A few scattered calcified granulomas are noted. No pneumothorax is seen. Upper Abdomen: No acute abnormality. Musculoskeletal: No acute bony abnormality is noted. Review of the MIP images confirms the above findings. IMPRESSION: Bibasilar consolidation with small effusions. No evidence of pulmonary emboli. Changes of prior granulomatous disease. Electronically Signed   By: Inez Catalina M.D.   On: 01/12/2021 11:34   DG Chest Port 1 View  Result Date: 01/12/2021 CLINICAL DATA:  Acute hypoxemic respiratory failure. EXAM: PORTABLE CHEST 1 VIEW COMPARISON:  January 10, 2021. FINDINGS: Bibasilar opacities, slightly improved on the left and worse on the right. Suspected new small right pleural effusion. No visible pneumothorax or left-sided pleural effusion. Similar cardiomediastinal silhouette. No acute osseous abnormality. IMPRESSION: Bibasilar opacities, slightly improved on the left and worse on the right. Suspected new small right pleural effusion. Electronically Signed   By: Margaretha Sheffield MD   On: 01/12/2021 08:21   DG Chest Port 1 View  Result Date: 01/10/2021 CLINICAL DATA:  Pneumonia, lesion or disease EXAM: PORTABLE CHEST 1 VIEW COMPARISON:  01/05/2021 FINDINGS: Mild bibasilar airspace disease. No pleural effusion or pneumothorax. Heart and mediastinal contours are unremarkable. No acute osseous abnormality. IMPRESSION: Mild bibasilar airspace disease concerning for pneumonia. Electronically Signed   By: Kathreen Devoid   On: 01/10/2021 13:15   ECHOCARDIOGRAM COMPLETE  Result Date: 01/07/2021    ECHOCARDIOGRAM REPORT   Patient Name:   Jermaine Harris Date of Exam: 01/07/2021 Medical Rec #:  [id number]      Height:       67.2 in Accession #:     [id number]     Weight:       212.0 lb Date of Birth:  Jan 11, 1961      BSA:          2.078 m Patient Age:    59 years       BP:           124/67 mmHg Patient Gender: M              HR:  56 bpm. Exam Location:  [facility] Procedure: 2D Echo, Cardiac Doppler and Color Doppler Indications:     Acute respiratory distress R06.03  History:         Patient has no prior history of Echocardiogram examinations.                  Risk Factors:Hypertension.  Sonographer:     Sherrie Sport RDCS (AE) Referring Phys:  [id number] Awilda Bill Diagnosing Phys: Ida Rogue MD IMPRESSIONS  1. Left ventricular ejection fraction, by estimation, is 55 to 60%. The left ventricle has normal function. The left ventricle has no regional wall motion abnormalities. Left ventricular diastolic parameters are consistent with Grade II diastolic dysfunction (pseudonormalization).  2. Right ventricular systolic function is normal. The right ventricular size is normal. There is mildly elevated pulmonary artery systolic pressure. The estimated right ventricular systolic pressure is 45.4 mmHg.  3. Left atrial size was mildly dilated.  4. The mitral valve is normal in structure. Mild to moderate mitral valve regurgitation. FINDINGS  Left Ventricle: Left ventricular ejection fraction, by estimation, is 55 to 60%. The left ventricle has normal function. The left ventricle has no regional wall motion abnormalities. The left ventricular internal cavity size was normal in size. There is  no left ventricular hypertrophy. Left ventricular diastolic parameters are consistent with Grade II diastolic dysfunction (pseudonormalization). Right Ventricle: The right ventricular size is normal. No increase in right ventricular wall thickness. Right ventricular systolic function is normal. There is mildly elevated pulmonary artery systolic pressure. The tricuspid regurgitant velocity is 2.80  m/s, and with an assumed right atrial pressure of 5 mmHg, the estimated right  ventricular systolic pressure is 09.8 mmHg. Left Atrium: Left atrial size was mildly dilated. Right Atrium: Right atrial size was normal in size. Pericardium: There is no evidence of pericardial effusion. Mitral Valve: The mitral valve is normal in structure. Mild mitral annular calcification. Mild to moderate mitral valve regurgitation. No evidence of mitral valve stenosis. Tricuspid Valve: The tricuspid valve is normal in structure. Tricuspid valve regurgitation is not demonstrated. No evidence of tricuspid stenosis. Aortic Valve: The aortic valve is normal in structure. Aortic valve regurgitation is not visualized. Mild aortic valve sclerosis is present, with no evidence of aortic valve stenosis. Aortic valve mean gradient measures 3.0 mmHg. Aortic valve peak gradient measures 4.4 mmHg. Aortic valve area, by VTI measures 2.76 cm. Pulmonic Valve: The pulmonic valve was normal in structure. Pulmonic valve regurgitation is not visualized. No evidence of pulmonic stenosis. Aorta: The aortic root is normal in size and structure. Venous: The inferior vena cava is normal in size with greater than 50% respiratory variability, suggesting right atrial pressure of 3 mmHg. IAS/Shunts: No atrial level shunt detected by color flow Doppler.  LEFT VENTRICLE PLAX 2D LVIDd:         5.01 cm  Diastology LVIDs:         3.32 cm  LV e' medial:    3.65 cm/s LV PW:         1.24 cm  LV E/e' medial:  24.3 LV IVS:        0.86 cm  LV e' lateral:   11.50 cm/s LVOT diam:     2.10 cm  LV E/e' lateral: 7.7 LV SV:         67 LV SV Index:   32 LVOT Area:     3.46 cm  RIGHT VENTRICLE RV Basal diam:  4.35 cm RV S prime:  11.91 cm/s TAPSE (M-mode): 4.2 cm LEFT ATRIUM             Index       RIGHT ATRIUM           Index LA diam:        4.40 cm 2.12 cm/m  RA Area:     27.00 cm LA Vol (A2C):   79.7 ml 38.35 ml/m RA Volume:   93.50 ml  44.99 ml/m LA Vol (A4C):   91.4 ml 43.98 ml/m LA Biplane Vol: 85.7 ml 41.24 ml/m  AORTIC VALVE                    PULMONIC VALVE AV Area (Vmax):    2.52 cm    PV Vmax:        0.64 m/s AV Area (Vmean):   2.52 cm    PV Peak grad:   1.6 mmHg AV Area (VTI):     2.76 cm    RVOT Peak grad: 3 mmHg AV Vmax:           105.00 cm/s AV Vmean:          79.600 cm/s AV VTI:            0.241 m AV Peak Grad:      4.4 mmHg AV Mean Grad:      3.0 mmHg LVOT Vmax:         76.40 cm/s LVOT Vmean:        57.900 cm/s LVOT VTI:          0.192 m LVOT/AV VTI ratio: 0.80  AORTA Ao Root diam: 3.43 cm MITRAL VALVE               TRICUSPID VALVE MV Area (PHT): 5.62 cm    TR Peak grad:   31.4 mmHg MV Decel Time: 135 msec    TR Vmax:        280.00 cm/s MV E velocity: 88.70 cm/s MV A velocity: 61.30 cm/s  SHUNTS MV E/A ratio:  1.45        Systemic VTI:  0.19 m                            Systemic Diam: 2.10 cm Ida Rogue MD Electronically signed by Ida Rogue MD Signature Date/Time: 01/07/2021/3:36:44 PM    Final       ASSESSMENT/PLAN   Acute hypoxemic respiratory failure Due to 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    -patient on gabapentin, fentanyl PRN, morphine PRN, xanax - I have advised patient to take minimal doses of  these medications as they hinder respiratory drive and cause fatigue which hinder ability to mobilize patient.     Thank you for allowing me to participate in the care of this patient.   Patient/Family  are satisfied with care plan and all questions have been answered.  This document was prepared using Dragon voice recognition software and may include unintentional dictation errors.     Ottie Glazier, M.D.  Division of [place]

## 2021-01-12 NOTE — Progress Notes (Signed)
Inpatient Diabetes Program Recommendations  AACE/ADA: New Consensus Statement on Inpatient Glycemic Control   Target Ranges:  Prepandial:   less than 140 mg/dL      Peak postprandial:   less than 180 mg/dL (1-2 hours)      Critically ill patients:  140 - 180 mg/dL   Results for Jermaine Harris, Jermaine Harris (MRN [id number]) as of 01/12/2021 11:36  Ref. Range 01/11/2021 07:34 01/11/2021 11:03 01/11/2021 15:17 01/11/2021 18:43 01/11/2021 20:51 01/12/2021 08:38 01/12/2021 11:31  Glucose-Capillary Latest Ref Range: 70 - 99 mg/dL 148 (H) 187 (H) 202 (H) 166 (H) 162 (H) 147 (H) 142 (H)  Results for Jermaine Harris, Jermaine Harris (MRN [id number]) as of 01/12/2021 11:36  Ref. Range 10/13/2020 09:59 01/05/2021 14:26  Hemoglobin A1C Latest Ref Range: 4.8 - 5.6 % 7.2 (A) 7.8 (H)   Review of Glycemic Control  Diabetes history: DM2 Outpatient Diabetes medications: Metformin XR 500 mg QHS Current orders for Inpatient glycemic control: Lantus 20 units daily, Novolog 0-9 units TID with meals, Novolog 0-5 units QHS  Inpatient Diabetes Program Recommendations:    HbgA1C:  A1C 7.8% on 01/05/21 indicating an average glucose of 177 mg/dl over the past 2-3 months. May want to consider increasing Metformin XR to 500 mg BID (if not contraindicated) as an outpatient and have patient follow up with PCP regarding DM control.  NOTE: Diabetes coordinator had spoke with patient's wife on 01/08/21 and patient's wife had requested that diabetes coordinator talk with patient and his wife this week after patient was feeling better. Met patient and wife at bedside to discuss DM. Patient confirms that he has not had DM a long time and that he was taking Metformin XR 500 mg QHS. Patient had initially been prescribed Metformin 500 mg but he did not tolerate due to GI side effects and was changed to Metformin XR. Patient reports that he has tolerated the Metformin XR well without any issues. Patient reports that he does not check glucose at home and does not have a glucometer. Patient  is knowledgeable about DM but had several questions about diet. Discussed A1C results (7.8% on 01/05/21 ) and explained that current A1C indicates an average glucose of 177 mg/dl over the past 2-3 months. Discussed glucose and A1C goals. Discussed importance of checking CBGs and maintaining good CBG control to prevent long-term and short-term complications.  Discussed impact of nutrition, exercise, stress, sickness, and medications on diabetes control.  Discussed carbohydrates, carbohydrate goals per day and meal, along with portion sizes. Encouraged patient to eat carbohydrates in moderation and limit amount of carbohydrates to 50-75 grams per meal.  Encouraged patient to check glucose as directed at discharge and to take glucometer with him to to doctor appointments. Explained how the doctor can use the glucose readings to continue to make adjustments with DM medications if needed. Discussed outpatient DM education and patient states that he does not feel he needs outpatient DM education at this time. Informed patient that if he has been consistently taking Metformin XR 500 mg QHS and with A1C 7.8%, he will likely need additional DM medication or increase dosage or frequency of Metformin XR to improve DM control. Encouraged patient to be sure to follow up with PCP regarding DM control.  Patient verbalized understanding of information discussed and reports no further questions at this time related to diabetes.  Thanks, Barnie Alderman, RN, MSN, CDE Diabetes Coordinator Inpatient Diabetes Program [phone] (Team Pager)

## 2021-01-13 ENCOUNTER — Ambulatory Visit: Payer: BC Managed Care – PPO

## 2021-01-13 DIAGNOSIS — F419 Anxiety disorder, unspecified: Secondary | ICD-10-CM | POA: Diagnosis not present

## 2021-01-13 DIAGNOSIS — A481 Legionnaires' disease: Secondary | ICD-10-CM | POA: Diagnosis not present

## 2021-01-13 DIAGNOSIS — J9601 Acute respiratory failure with hypoxia: Secondary | ICD-10-CM

## 2021-01-13 DIAGNOSIS — Z9981 Dependence on supplemental oxygen: Secondary | ICD-10-CM

## 2021-01-13 DIAGNOSIS — E119 Type 2 diabetes mellitus without complications: Secondary | ICD-10-CM | POA: Diagnosis not present

## 2021-01-13 DIAGNOSIS — I5033 Acute on chronic diastolic (congestive) heart failure: Secondary | ICD-10-CM | POA: Diagnosis not present

## 2021-01-13 DIAGNOSIS — J156 Pneumonia due to other aerobic Gram-negative bacteria: Secondary | ICD-10-CM | POA: Diagnosis not present

## 2021-01-13 LAB — BASIC METABOLIC PANEL
Anion gap: 10 (ref 5–15)
BUN: 28 mg/dL — ABNORMAL HIGH (ref 6–20)
CO2: 23 mmol/L (ref 22–32)
Calcium: 8.4 mg/dL — ABNORMAL LOW (ref 8.9–10.3)
Chloride: 103 mmol/L (ref 98–111)
Creatinine, Ser: 1.17 mg/dL (ref 0.61–1.24)
GFR, Estimated: >60 mL/min (ref >60)
Glucose, Bld: 133 mg/dL — ABNORMAL HIGH (ref 70–99)
Potassium: 4.6 mmol/L (ref 3.5–5.1)
Sodium: 136 mmol/L (ref 135–145)

## 2021-01-13 LAB — GLUCOSE, CAPILLARY
Glucose-Capillary: 117 mg/dL — ABNORMAL HIGH (ref 70–99)
Glucose-Capillary: 128 mg/dL — ABNORMAL HIGH (ref 70–99)
Glucose-Capillary: 146 mg/dL — ABNORMAL HIGH (ref 70–99)
Glucose-Capillary: 103 mg/dL — ABNORMAL HIGH (ref 70–99)

## 2021-01-13 LAB — PHOSPHORUS: Phosphorus: 3.9 mg/dL (ref 2.5–4.6)

## 2021-01-13 LAB — MISCELLANEOUS TEST

## 2021-01-13 LAB — MAGNESIUM: Magnesium: 2.4 mg/dL (ref 1.7–2.4)

## 2021-01-13 MED ORDER — HYDROCOD POLST-CPM POLST ER 10-8 MG/5ML PO SUER
5.0000 mL | Freq: Two times a day (BID) | ORAL | Status: DC
Start: 2021-01-13 — End: 2021-01-15
  Administered 2021-01-13 – 2021-01-15 (×5): 5 mL via ORAL
  Filled 2021-01-13 (×5): qty 5

## 2021-01-13 MED ORDER — ACETYLCYSTEINE 20 % IN SOLN
4.0000 mL | Freq: Three times a day (TID) | RESPIRATORY_TRACT | Status: DC
  Filled 2021-01-13: qty 4

## 2021-01-13 MED ORDER — GUAIFENESIN ER 600 MG PO TB12
600.0000 mg | ORAL_TABLET | Freq: Two times a day (BID) | ORAL | Status: DC
  Administered 2021-01-13 – 2021-01-15 (×5): 600 mg via ORAL
  Filled 2021-01-13 (×5): qty 1

## 2021-01-13 MED ORDER — IPRATROPIUM-ALBUTEROL 0.5-2.5 (3) MG/3ML IN SOLN
3.0000 mL | RESPIRATORY_TRACT | Status: DC
  Administered 2021-01-13 – 2021-01-15 (×10): 3 mL via RESPIRATORY_TRACT
  Filled 2021-01-13 (×11): qty 3

## 2021-01-13 NOTE — Progress Notes (Signed)
Nutrition Follow Up Note   DOCUMENTATION CODES:   Obesity unspecified  INTERVENTION:   Discontinue Ensure Max as pt is refusing   Continue MVI po daily   NUTRITION DIAGNOSIS:   Inadequate oral intake related to acute illness as evidenced by per patient/family report.  GOAL:   Patient will meet greater than or equal to 90% of their needs  -progressing   MONITOR:   PO intake,Supplement acceptance,Labs,Weight trends,Skin,I & O's  ASSESSMENT:   60 y.o. male with medical history significant for hypertension, NIDDM, recent international travel to [place], hypertension, diverticulosis and anxiety/depression who is admitted with CAP.   Pt with improved appetite and oral intake; pt eating 90-100% of meals in hospital. Pt is refusing Ensure Max; RD will discontinue. Per chart, pt is down ~7lbs since admit.   Medications reviewed and include: risaquad, celexa, lovenox, insulin  Labs reviewed: K 4.6 wnl, BUN 28(H), P 3.9 wnl, Mg 2.4 wnl cbgs- 117, 128 x 24hrs  Diet Order:   Diet Order            Diet heart healthy/carb modified Room service appropriate? Yes; Fluid consistency: Thin  Diet effective now                EDUCATION NEEDS:   Education needs have been addressed  Skin:  Skin Assessment: Reviewed RN Assessment  Last BM:  2/8  Height:   Ht Readings from Last 1 Encounters:  01/05/21 5' 7.25" (1.708 m)    Weight:   Wt Readings from Last 1 Encounters:  01/12/21 92.9 kg    Ideal Body Weight:  67.27 kg  BMI:  Body mass index is 31.84 kg/m.  Estimated Nutritional Needs:   Kcal:  2000-2300kcal/day  Protein:  100-115g/day  Fluid:  >2L/day  Koleen Distance MS, RD, LDN Please refer to [facility] for RD and/or RD on-call/weekend/after hours pager

## 2021-01-13 NOTE — Progress Notes (Signed)
PROGRESS NOTE    Jermaine Harris  NWG:[id number] DOB: 1961/01/13 DOA: 01/05/2021 PCP: Ria Bush, MD   Chief complaint.  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 Patient is not sure whether any of other members have the same symptoms. Patient also reported lightheadedness and dizziness with shortness of breath. He is fully vaccinated. Infectious disease and pulmonology consulted.His presentation is consistent with Legionella pneumonia. Legionella antigen +. Antibiotics changed to Levaquin. Patient was giving IV Lasix 2/6, he had a BMP elevation of 482. Echocardiogram showed normal ejection fraction with a right ventricular pressure 36.4.   Assessment & Plan:   Active Problems:   Essential hypertension   PNA (pneumonia)   Acute respiratory failure (HCC)   Acute on chronic diastolic CHF (congestive heart failure) ([place])   AKI (acute kidney injury) ([place])  #1. Acute respiratory failure with hypoxemia secondary to pneumonia. Legionella pneumonia. Acute on chronic diastolic congestive heart failure.POA. Mild pulmonary hypertension. Patient condition gradually improved.  Currently on 8 L oxygen, with good oxygen saturation.  He was able to walk in the hallway without desaturation. His volume status has improved. At this point, I will continue Levaquin for pneumonia. Anticipating discharge on Friday with or without 2 L oxygen. Reviewed patient CT scan performed yesterday, personally reviewed images.  Patient has a small pleural effusion on the right side, not enough to drain.  Will need  outpatient follow-up x-ray in short-term to determine if it gets worse. Patient also will need a sleep study after discharge.  #2.  Type 2 diabetes. Continue current regimen  3.  Acute kidney injury. Renal function normalized.     DVT prophylaxis: Lovenox Code Status: Full Family Communication:  Disposition Plan:  .   Status is: Inpatient  Remains inpatient appropriate because:Inpatient level of care appropriate due to severity of illness   Dispo: The patient is from: Home              Anticipated d/c is to: Home              Anticipated d/c date is: 2 days              Patient currently is not medically stable to d/c.   Difficult to place patient No        I/O last 3 completed shifts: In: 723 [P.O.:720; I.V.:3] Out: 1575 [Urine:1575] No intake/output data recorded.     Consultants:   Pulm  Procedures: None  Antimicrobials:  Levaquin  Subjective: Patient feels much improved.  Currently on 8 L oxygen with good oxygen saturation.  He was able to walk with physical therapy in the hallway without a drop of oxygen saturation.  He has no cough. Denies any chest pain or palpitation. No fever or chills. No dysuria or hematuria.  Objective: Vitals:   01/12/21 1951 01/12/21 2057 01/13/21 0445 01/13/21 0750  BP:  122/79 98/70 103/72  Pulse:  79 80 75  Resp:  20  18  Temp:  98.8 F (37.1 C) (!) 97.5 F (36.4 C) 99.3 F (37.4 C)  TempSrc:  Oral Oral Oral  SpO2: 93% 94% 96% 98%  Weight:  92.9 kg    Height:  Intake/Output Summary (Last 24 hours) at 01/13/2021 0934 Last data filed at 01/12/2021 1820 Gross per 24 hour  Intake 720 ml  Output 500 ml  Net 220 ml   Filed Weights   01/11/21 0339 01/12/21 0435 01/12/21 2057  Weight: 91.8 kg 92 kg 92.9 kg    Examination:  General exam: Appears calm and comfortable  Respiratory system: Clear to auscultation. Respiratory effort normal. Cardiovascular system: S1 & S2 heard, RRR. No JVD, murmurs, rubs,  gallops or clicks. No pedal edema. Gastrointestinal system: Abdomen is nondistended, soft and nontender. No organomegaly or masses felt. Normal bowel sounds heard. Central nervous system: Alert and oriented. No focal neurological deficits. Extremities: Symmetric 5 x 5 power. Skin: No rashes, lesions or ulcers Psychiatry: Judgement and insight appear normal. Mood & affect appropriate.     Data Reviewed: I have personally reviewed following labs and imaging studies  CBC: Recent Labs  Lab 01/07/21 0447 01/08/21 0344 01/09/21 0244 01/10/21 0354 01/11/21 0336  WBC 10.5 13.7* 9.7 7.9 8.6  HGB 10.2* 9.8* 9.2* 10.2* 11.1*  HCT 31.5* 29.4* 28.4* 31.2* 34.5*  MCV 84.9 84.5 85.0 84.8 86.0  PLT 258 300 354 361 856   Basic Metabolic Panel: Recent Labs  Lab 01/09/21 0244 01/10/21 0354 01/11/21 0336 01/12/21 0631 01/13/21 0423  NA 135 136 137 136 136  K 4.2 4.4 4.3 4.7 4.6  CL 106 105 103 101 103  CO2 21* 24 27 26 23   GLUCOSE 192* 120* 153* 153* 133*  BUN 30* 18 24* 29* 28*  CREATININE 0.91 0.97 1.15 1.26* 1.17  CALCIUM 7.7* 8.2* 8.3* 8.5* 8.4*  MG 2.0 2.1 2.1 2.4 2.4  PHOS 3.3 4.7* 4.2 4.0 3.9   GFR: Estimated Creatinine Clearance: 73.3 mL/min (by C-G formula based on SCr of 1.17 mg/dL). Liver Function Tests: Recent Labs  Lab 01/07/21 0447 01/08/21 0344 01/09/21 0244 01/10/21 0354 01/11/21 0336  AST 44* 27 48* 39 30  ALT 57* 44 63* 58* 60*  ALKPHOS 62 60 52 51 54  BILITOT 0.7 0.4 0.6 0.6 0.6  PROT 6.0* 5.6* 5.6* 5.5* 6.0*  ALBUMIN 2.5* 2.3* 2.4* 2.4* 2.7*   No results for input(s): LIPASE, AMYLASE in the last 168 hours. No results for input(s): AMMONIA in the last 168 hours. Coagulation Profile: No results for input(s): INR, PROTIME in the last 168 hours. Cardiac Enzymes: No results for input(s): CKTOTAL, CKMB, CKMBINDEX, TROPONINI in the last 168 hours. BNP (last 3 results) No results for input(s): PROBNP in the last 8760 hours. HbA1C: No results for input(s):  HGBA1C in the last 72 hours. CBG: Recent Labs  Lab 01/12/21 0838 01/12/21 1131 01/12/21 1650 01/12/21 2106 01/13/21 0751  GLUCAP 147* 142* 119* 117* 117*   Lipid Profile: No results for input(s): CHOL, HDL, LDLCALC, TRIG, CHOLHDL, LDLDIRECT in the last 72 hours. Thyroid Function Tests: No results for input(s): TSH, T4TOTAL, FREET4, T3FREE, THYROIDAB in the last 72 hours. Anemia Panel: No results for input(s): VITAMINB12, FOLATE, FERRITIN, TIBC, IRON, RETICCTPCT in the last 72 hours. Sepsis Labs: Recent Labs  Lab 01/07/21 0447 01/08/21 0344 01/09/21 0244  PROCALCITON 0.71 0.57 0.24    Recent Results (from the past 240 hour(s))  Culture, blood (single) w Reflex to ID Panel     Status: None   Collection Time: 01/04/21  1:18 PM   Specimen: Blood  Result Value Ref Range Status   MICRO NUMBER: [id number]  Final   SPECIMEN QUALITY: Suboptimal  Final   Source NOT GIVEN  Final   STATUS: FINAL  Final   Result:   Final    No growth after 5 days Inspection of blood culture bottles indicates that an inadequate volume of blood may have been collected for the detection of sepsis.   COMMENT: Aerobic and anaerobic bottle received.  Final  Culture, blood (single) w Reflex to ID Panel     Status: None   Collection Time: 01/04/21  1:18 PM   Specimen: Blood  Result Value Ref Range Status   MICRO NUMBER: [id number]  Final   SPECIMEN QUALITY: Suboptimal  Final   Source BLOOD  Final   STATUS: FINAL  Final   Result:   Final    No growth after 5 days Inspection of blood culture bottles indicates that an inadequate volume of blood may have been collected for the detection of sepsis.   COMMENT: Aerobic and anaerobic bottle received.  Final  SARS Coronavirus 2 by RT PCR (hospital order, performed in [facility] hospital lab) Nasopharyngeal Nasopharyngeal Swab     Status: None   Collection Time: 01/05/21  2:32 PM   Specimen: Nasopharyngeal Swab  Result Value Ref Range Status   SARS Coronavirus 2  NEGATIVE NEGATIVE Final    Comment: (NOTE) SARS-CoV-2 target nucleic acids are NOT DETECTED.  The SARS-CoV-2 RNA is generally detectable in upper and lower respiratory specimens during the acute phase of infection. The lowest concentration of SARS-CoV-2 viral copies this assay can detect is 250 copies / mL. A negative result does not preclude SARS-CoV-2 infection and should not be used as the sole basis for treatment or other patient management decisions.  A negative result may occur with improper specimen collection / handling, submission of specimen other than nasopharyngeal swab, presence of viral mutation(s) within the areas targeted by this assay, and inadequate number of viral copies (<250 copies / mL). A negative result must be combined with clinical observations, patient history, and epidemiological information.  Fact Sheet for Patients:   StrictlyIdeas.no  Fact Sheet for Healthcare Providers: BankingDealers.co.za  This test is not yet approved or  cleared by the [place] FDA and has been authorized for detection and/or diagnosis of SARS-CoV-2 by FDA under an Emergency Use Authorization (EUA).  This EUA will remain in effect (meaning this test can be used) for the duration of the COVID-19 declaration under Section 564(b)(1) of the Act, 21 U.S.C. section 360bbb-3(b)(1), unless the authorization is terminated or revoked sooner.  Performed at [facility], [place], [place] [address]   Respiratory (~20 pathogens) panel by PCR     Status: None   Collection Time: 01/05/21  5:14 PM   Specimen: Flu Kit Nasopharyngeal Swab; Respiratory  Result Value Ref Range Status   Adenovirus NOT DETECTED NOT DETECTED Final   Coronavirus 229E NOT DETECTED NOT DETECTED Final    Comment: (NOTE) The Coronavirus on the Respiratory Panel, DOES NOT test for the novel  Coronavirus (2019 nCoV)    Coronavirus HKU1 NOT  DETECTED NOT DETECTED Final   Coronavirus NL63 NOT DETECTED NOT DETECTED Final   Coronavirus OC43 NOT DETECTED NOT DETECTED Final   Metapneumovirus NOT DETECTED NOT DETECTED Final   Rhinovirus / Enterovirus NOT DETECTED NOT DETECTED Final   Influenza A NOT DETECTED NOT DETECTED Final   Influenza B NOT DETECTED NOT DETECTED Final   Parainfluenza Virus 1 NOT DETECTED NOT DETECTED Final   Parainfluenza Virus 2 NOT DETECTED NOT DETECTED Final   Parainfluenza Virus 3 NOT DETECTED NOT DETECTED  Final   Parainfluenza Virus 4 NOT DETECTED NOT DETECTED Final   Respiratory Syncytial Virus NOT DETECTED NOT DETECTED Final   Bordetella pertussis NOT DETECTED NOT DETECTED Final   Bordetella Parapertussis NOT DETECTED NOT DETECTED Final   Chlamydophila pneumoniae NOT DETECTED NOT DETECTED Final   Mycoplasma pneumoniae NOT DETECTED NOT DETECTED Final    Comment: Performed at [facility], [place] [address], [place], [place] [address]  MRSA PCR Screening     Status: None   Collection Time: 01/06/21  4:01 AM   Specimen: Urine, Clean Catch; Nasopharyngeal  Result Value Ref Range Status   MRSA by PCR NEGATIVE NEGATIVE Final    Comment:        The GeneXpert MRSA Assay (FDA approved for NASAL specimens only), is one component of a comprehensive MRSA colonization surveillance program. It is not intended to diagnose MRSA infection nor to guide or monitor treatment for MRSA infections. Performed at [facility]: [address] [place], [place] [address]          Radiology Studies: CT ANGIO CHEST PE W OR WO CONTRAST  Result Date: 01/12/2021 CLINICAL DATA:  Shortness of breath EXAM: CT ANGIOGRAPHY CHEST WITH CONTRAST TECHNIQUE: Multidetector CT imaging of the chest was performed using the standard protocol during bolus administration of intravenous contrast. Multiplanar CT image reconstructions and MIPs were obtained to evaluate the vascular anatomy. CONTRAST:  50m OMNIPAQUE IOHEXOL 350  MG/ML SOLN COMPARISON:  Chest x-ray from earlier in the same day. FINDINGS: Cardiovascular: Thoracic aorta and its branches demonstrate atherosclerotic calcification without aneurysmal dilatation or dissection. No cardiac enlargement is noted. The pulmonary artery shows a normal branching pattern. No filling defect is identified within the pulmonary artery to suggest pulmonary embolism. Mediastinum/Nodes: Thoracic inlet is within normal limits. No sizable hilar or mediastinal adenopathy is noted. The esophagus as visualized is within normal limits. Lungs/Pleura: Lungs are well aerated bilaterally with mild bibasilar consolidation and small effusions. A few scattered calcified granulomas are noted. No pneumothorax is seen. Upper Abdomen: No acute abnormality. Musculoskeletal: No acute bony abnormality is noted. Review of the MIP images confirms the above findings. IMPRESSION: Bibasilar consolidation with small effusions. No evidence of pulmonary emboli. Changes of prior granulomatous disease. Electronically Signed   By: MInez CatalinaM.D.   On: 01/12/2021 11:34   DG Chest Port 1 View  Result Date: 01/12/2021 CLINICAL DATA:  Acute hypoxemic respiratory failure. EXAM: PORTABLE CHEST 1 VIEW COMPARISON:  January 10, 2021. FINDINGS: Bibasilar opacities, slightly improved on the left and worse on the right. Suspected new small right pleural effusion. No visible pneumothorax or left-sided pleural effusion. Similar cardiomediastinal silhouette. No acute osseous abnormality. IMPRESSION: Bibasilar opacities, slightly improved on the left and worse on the right. Suspected new small right pleural effusion. Electronically Signed   By: FMargaretha SheffieldMD   On: 01/12/2021 08:21        Scheduled Meds: . acidophilus  1 capsule Oral Daily  . citalopram  20 mg Oral Daily  . enoxaparin (LOVENOX) injection  0.5 mg/kg Subcutaneous Q24H  . gabapentin  300 mg Oral QHS  . insulin aspart  0-5 Units Subcutaneous QHS  . insulin  aspart  0-9 Units Subcutaneous TID WC  . insulin glargine  20 Units Subcutaneous Daily  . ipratropium-albuterol  3 mL Nebulization TID  . irbesartan  150 mg Oral Daily  . mometasone-formoterol  2 puff Inhalation BID  . Ensure Max Protein  11 oz Oral BID  . sodium  chloride flush  3 mL Intravenous Q12H   Continuous Infusions: . sodium chloride Stopped (01/10/21 1919)  . levofloxacin (LEVAQUIN) IV 750 mg (01/12/21 1834)     LOS: 7 days    Time spent: 28 minutes    Sharen Hones, MD [facility]   To contact the attending provider between 7A-7P or the covering provider during after hours 7P-7A, please log into the web site [email or web address] and access using universal [place] password for that web site. If you do not have the password, please call the hospital operator.  01/13/2021, 9:34 AM

## 2021-01-13 NOTE — Progress Notes (Signed)
NAME:  Jermaine Harris, MRN:  [id number], DOB:  1961-10-11, LOS: 7 ADMISSION DATE:  01/05/2021, CONSULTATION DATE:  01/06/2021 REFERRING MD:  Dr. Dwyane Dee, CHIEF COMPLAINT:  Cough and Fevers  Brief History:  60 y.o. Male with recent international travel to [place] who presented on 01/05/21 with flulike symptoms (myalgia, fever, chills, and cough).  Pt now with Acute Hypoxic Respiratory Failure in the setting of Bilateral Legionella Pneumonia involving the lower lobes.   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   He left for [place] on 12/17/20 and reaced there eon 12/18/20 , stayed in [place] for a week, mostly had meeting in the hotel and visited certain places like [employer] .   He spent a day in [place] and returned to the [place] on 12/26/20.  - went to work on 1/24, 1/25.   1/24-1/25 cough 1/26 high fevers 1/27 COVID rapid AG test NEG Started ABX 1/31 . He had a televist with his PCP Dr.Gutierrez on 1/31 and blood culture sent, CXR showed rt lower lobe opacity  2/1 ID and PULMONARY CONSULTED   ER COURSE CXR showed RLL opacity COVID test neg Started on ceftriaxone and azithromycin  no sick contacts Triple vaccinated for covid Placed on High flow [place] HHF 45L fio2 at 75%-feels better with HHF  Past Medical History:  Actinic Keratosis Claustrophobia Generalized Anxiety Disorder Hypertension Irritable Bowel Syndrome Lumbar Herniated Disc (06/2015) Seasonal Allergies  Significant Hospital Events:  2/1: Presented to ED, Hospitalist to admit to Stepdown 2/1: ID & Pulmonary Consulted 01/09/2021- patient is tired today, he reports inability to sleep overnight.  Discussed with Dr Dwyane Dee today and we agreed on stopping steroids today.  I have  encouraged him to attempt PT and OT again today.  He is weaned to 13L/min.  Bloodwork stable with resolution of leukocytosis and trending down procalcitonin.  01/10/2021- Patient examined at bedside.  He is clinically imporved but remains on 13-15L/min [place].  He had spirometry done today.  Additionally he has workup in process to investigate cardiac function.  His bloodwork shows BMP is within reference range, CBC with resolution of leucocytosis. He continues to work with PT.   01/10/21: BNP 482, received IV Lasix 01/12/2021- Patient examined at bedside. Clinically imporved.  He was diuresed s/p 5L net negative.  He had O2 weaned from 15L to 52.  BMP is with prerenal AKI mild stage 1 likely treansient post diuretic.   He had repeat CT with PE protocol, improved infiltration of both lungs at bases with residual infiltrates worse at bases posteriorly.  He is working with PT. 01/13/21- Weaned to 8L [place], slowly improving  Consults:  Hospitalist (primary service) ID Pulmonary  Procedures:  N/A  Significant Diagnostic Tests:  2/1: Chest x-ray>>Right lower lobe infiltrate posteriorly unchanged from the prior study. No significant pleural effusion. Mild left lower lobe airspace disease unchanged. Negative for heart failure 2/2: CT chest without contrast>>1. Bilateral lower lobe airspace consolidation with air bronchograms consistent with multifocal pneumonia. 2. Hepatic steatosis. 3. Aortic atherosclerosis. 2/3: Echocardiogram>>1. Left ventricular ejection fraction, by estimation, is 55 to 60%. The  left ventricle has normal function. The left ventricle has no regional  wall motion abnormalities. Left ventricular diastolic parameters are  consistent with Grade  II diastolic  dysfunction (pseudonormalization).  2. Right ventricular systolic function is normal. The right ventricular  size is normal. There is mildly elevated pulmonary artery systolic  pressure. The estimated right ventricular systolic  pressure is 67.3 mmHg.  3. Left atrial size was mildly dilated.  4. The mitral valve is normal in structure. Mild to moderate mitral valve  regurgitation.  2/8: CTA Chest>>Bibasilar consolidation with small effusions. No evidence of pulmonary emboli. Changes of prior granulomatous disease.  Micro Data:  1/27: SARS-CoV-2 Ag>> negative 1/27: Influenza A&B POC>> negative 1/27: Novel coronavirus NAA>> not detected 1/31: Blood culture x2>> no growth to date 2/1: SARS-CoV-2 PCR>> negative 2/1: Respiratory viral panel>> negative 2/1: HIV screen>> nonreactive 2/2: MRSA PCR>> negative 2/2: Strep pneumo urinary antigen>> negative 2/2: Legionella urinary antigen>>POSITIVE  Antimicrobials:  Azithromycin 2/1>>2/4 Ceftriaxone 2/1>>2/4 Doxycycline 2/2>>2/3 Levofloxacin 2/4>>  Interim History / Subjective:  -Afebrile, Hemodynamically stable -Weaned to 8L supplemental O2 via [place] and tolerating with O2 saturations 94-95% -5.6 L since admit, 975 ml urine output yesterday -Denies chest pain, SOB, abdominal pain, N/V/D, edema, fever/chills -Reports intermittent cough, and approximately 45 minutes ago he had coughing event with difficulty clearing secretions and SOB; event was relieved by PRN duoneb    Objective   Blood pressure 106/71, pulse 86, temperature (!) 97.4 F (36.3 C), temperature source Axillary, resp. rate 17, height 5' 7.25" (1.708 m), weight 92.9 kg, SpO2 96 %.        Intake/Output Summary (Last 24 hours) at 01/13/2021 1307 Last data filed at 01/13/2021 4193 Gross per 24 hour  Intake 480 ml  Output 500 ml  Net -20 ml   Filed Weights   01/11/21 0339 01/12/21 0435 01/12/21 2057  Weight: 91.8 kg 92 kg 92.9 kg    Examination: General: Acutely ill appearing male, sitting in bed, on Jud, in no acute distress HENT: Atraumatic, normocephalic, neck supple, no JVD Lungs: Clear to auscultation bilaterally, no wheezing or rales, even, nonlabored, normal effort Cardiovascular: Regular  rate and rhythm, s1s2, no M/R/G.  2+ distal pulses Abdomen: Obese, soft, nontender, nondistended, no guarding or rebound tenderness, BS+ x4 Extremities: Normal bulk and tone, no edema, no clubbing Neuro: Awake, A&O x4, follows commands, no focal deficits, speech clear Skin: Warm and dry.  No obvious rashes, lesions, or ulcerations  Resolved Hospital Problem list   N/A  Assessment & Plan:   Acute Hypoxic Respiratory Failure in the setting of Bilateral Legionella Pneumonia -Supplemental O2 as needed to maintain O2 saturations greater than 92% -Currently tolerating  8L -Follow intermittent chest x-ray and ABG as needed -CTA Chest on 01/12/21 with Bibasilar consolidation with small effusions. No evidence of pulmonary emboli. Changes of prior granulomatous disease -Bronchodilators -Completed course of Steroids -Continue Dulera -Incentive spirometry and flutter valve -Continue Levaquin as per ID   Bilateral Legionella Pneumonia -Monitor fever curve -Trend WBC's -Follow cultures as above  -ID following, appreciate input -Antibiotics as per ID (currently on Levaquin, to complete course on 01/15/21)   Acute on Chronic HFpEF Mild Pulmonary Hypertension -Monitor volume status -Diuresis as indicated (last received lasix on 2/7) -Echo on 01/07/21 with LVEF 55-60%, Grade II Diastolic dysfunction, mild pulmonary artery htn   AKI>>improved -Monitor I&O's / urinary output -Follow BMP -Ensure adequate renal perfusion -Avoid nephrotoxic agents as able -Replace electrolytes as indicated   Diabetes Mellitus -CBG's -SSI and Lantus 20 units daily    Best practice (evaluated daily)  Diet: Heart Healthy/Carb Modified Pain/Anxiety/Delirium protocol (if indicated): Prn Morphine, Xanax VAP protocol (  if indicated): N/A DVT prophylaxis: Lovenox GI prophylaxis: N/A Glucose control: SSI Mobility: As tolerated Disposition: Med/Surg  Goals of Care:  Last date of multidisciplinary goals of  care discussion: N/A Family and staff present: Updated pt at bedside 01/13/2021 Summary of discussion: Continue ABX, prn Bronchodilators, wean O2 as tolerated Follow up goals of care discussion due: N/A Code Status: Full Code  Labs   CBC: Recent Labs  Lab 01/07/21 0447 01/08/21 0344 01/09/21 0244 01/10/21 0354 01/11/21 0336  WBC 10.5 13.7* 9.7 7.9 8.6  HGB 10.2* 9.8* 9.2* 10.2* 11.1*  HCT 31.5* 29.4* 28.4* 31.2* 34.5*  MCV 84.9 84.5 85.0 84.8 86.0  PLT 258 300 354 361 992    Basic Metabolic Panel: Recent Labs  Lab 01/09/21 0244 01/10/21 0354 01/11/21 0336 01/12/21 0631 01/13/21 0423  NA 135 136 137 136 136  K 4.2 4.4 4.3 4.7 4.6  CL 106 105 103 101 103  CO2 21* 24 27 26 23   GLUCOSE 192* 120* 153* 153* 133*  BUN 30* 18 24* 29* 28*  CREATININE 0.91 0.97 1.15 1.26* 1.17  CALCIUM 7.7* 8.2* 8.3* 8.5* 8.4*  MG 2.0 2.1 2.1 2.4 2.4  PHOS 3.3 4.7* 4.2 4.0 3.9   GFR: Estimated Creatinine Clearance: 73.3 mL/min (by C-G formula based on SCr of 1.17 mg/dL). Recent Labs  Lab 01/07/21 0447 01/08/21 0344 01/09/21 0244 01/10/21 0354 01/11/21 0336  PROCALCITON 0.71 0.57 0.24  --   --   WBC 10.5 13.7* 9.7 7.9 8.6    Liver Function Tests: Recent Labs  Lab 01/07/21 0447 01/08/21 0344 01/09/21 0244 01/10/21 0354 01/11/21 0336  AST 44* 27 48* 39 30  ALT 57* 44 63* 58* 60*  ALKPHOS 62 60 52 51 54  BILITOT 0.7 0.4 0.6 0.6 0.6  PROT 6.0* 5.6* 5.6* 5.5* 6.0*  ALBUMIN 2.5* 2.3* 2.4* 2.4* 2.7*   No results for input(s): LIPASE, AMYLASE in the last 168 hours. No results for input(s): AMMONIA in the last 168 hours.  ABG No results found for: PHART, PCO2ART, PO2ART, HCO3, TCO2, ACIDBASEDEF, O2SAT   Coagulation Profile: No results for input(s): INR, PROTIME in the last 168 hours.  Cardiac Enzymes: No results for input(s): CKTOTAL, CKMB, CKMBINDEX, TROPONINI in the last 168 hours.  HbA1C: Hemoglobin A1C  Date/Time Value Ref Range Status  10/13/2020 09:59 AM 7.2 (A) 4.0  - 5.6 % Final  06/25/2020 12:19 PM 7.1 (A) 4.0 - 5.6 % Final   Hgb A1c MFr Bld  Date/Time Value Ref Range Status  01/05/2021 02:26 PM 7.8 (H) 4.8 - 5.6 % Final    Comment:    (NOTE) Pre diabetes:          5.7%-6.4%  Diabetes:              >6.4%  Glycemic control for   <7.0% adults with diabetes   12/28/2018 08:23 AM 6.6 (H) 4.6 - 6.5 % Final    Comment:    Glycemic Control Guidelines for People with Diabetes:Non Diabetic:  <6%Goal of Therapy: <7%Additional Action Suggested:  >8%     CBG: Recent Labs  Lab 01/12/21 1131 01/12/21 1650 01/12/21 2106 01/13/21 0751 01/13/21 1124  GLUCAP 142* 119* 117* 117* 128*    Review of Systems:   Positives in BOLD: Gen: Denies fever, chills, weight change, fatigue, night sweats HEENT: Denies blurred vision, double vision, hearing loss, tinnitus, sinus congestion, rhinorrhea, sore throat, neck stiffness, dysphagia PULM: Denies shortness of breath, +cough, sputum production, hemoptysis, wheezing CV: Denies chest  pain, edema, orthopnea, paroxysmal nocturnal dyspnea, palpitations GI: Denies abdominal pain, nausea, vomiting, diarrhea, hematochezia, melena, constipation, change in bowel habits GU: Denies dysuria, hematuria, polyuria, oliguria, urethral discharge Endocrine: Denies hot or cold intolerance, polyuria, polyphagia or appetite change Derm: Denies rash, dry skin, scaling or peeling skin change Heme: Denies easy bruising, bleeding, bleeding gums Neuro: Denies headache, numbness, weakness, slurred speech, loss of memory or consciousness   Past Medical History:  He,  has a past medical history of Actinic keratosis, Claustrophobia, GAD (generalized anxiety disorder), HTN (hypertension), Irritable bowel syndrome, Lumbar herniated disc (06/2015), and Seasonal allergies.   Surgical History:   Past Surgical History:  Procedure Laterality Date  . COLON SURGERY    . COLONOSCOPY  2012   mild diverticulosis, rec rpt 5 yrs 2/2 fmhx polyps   . COLONOSCOPY WITH PROPOFOL N/A 05/29/2019   TA, diverticulosis, rpt 5 yrs (Bonna Gains, Lennette Bihari, MD)  . MICRODISCECTOMY LUMBAR  07/2015   L4/5/S1 [place]on Gammon)  . TONSILLECTOMY       Social History:   reports that he has never smoked. He has never used smokeless tobacco. He reports current alcohol use. He reports that he does not use drugs.   Family History:  His family history includes Colon polyps in his sister; Depression in his father and mother; Heart disease in his father; Sudden death (age of onset: 38) in his maternal grandfather; Transient ischemic attack (age of onset: 14) in his mother.   Allergies Allergies  Allergen Reactions  . Ace Inhibitors Cough     Home Medications  Prior to Admission medications   Medication Sig Start Date End Date Taking? Authorizing Provider  ALPRAZolam (NIRAVAM) 0.5 MG dissolvable tablet Take 1 tablet (0.5 mg total) by mouth daily as needed for anxiety (plane rides). 02/22/18   Ria Bush, MD  amoxicillin-clavulanate (AUGMENTIN) 875-125 MG tablet Take 1 tablet by mouth 2 (two) times daily. 01/02/21   [provider]  citalopram (CELEXA) 20 MG tablet Take 1 tablet (20 mg total) by mouth daily. 06/25/20   Ria Bush, MD  gabapentin (NEURONTIN) 300 MG capsule TAKE 1 CAPSULE BY MOUTH EVERYDAY AT BEDTIME 01/03/20   Ria Bush, MD  ibuprofen (ADVIL,MOTRIN) 200 MG tablet Take 200 mg by mouth as needed.    [provider]  metFORMIN (GLUCOPHAGE XR) 500 MG 24 hr tablet Take 1 tablet (500 mg total) by mouth daily. 10/13/20   Ria Bush, MD  valsartan-hydrochlorothiazide (DIOVAN-HCT) 160-12.5 MG tablet TAKE 1 TABLET BY MOUTH EVERY DAY 12/30/20   Ria Bush, MD     Critical care time: 30 minutes    Darel Hong, [facility] Pager: [phone]

## 2021-01-13 NOTE — Progress Notes (Signed)
ID Doing much better Oxygen 6 liters Patient Vitals for the past 24 hrs:  BP Temp Temp src Pulse Resp SpO2  01/13/21 2005 - - - - - 99 %  01/13/21 1954 120/77 98.5 F (36.9 C) Oral 88 18 99 %  01/13/21 1617 120/72 (!) 97.5 F (36.4 C) Oral 97 - 96 %  01/13/21 1515 - - - 85 (!) 21 96 %  01/13/21 1127 106/71 (!) 97.4 F (36.3 C) Axillary 86 - 96 %  01/13/21 0943 - - - 76 17 96 %  01/13/21 0750 103/72 99.3 F (37.4 C) Oral 75 18 98 %  01/13/21 0445 98/70 (!) 97.5 F (36.4 C) Oral 80 - 96 %   Chest b/l air entry crepts few bases HSs1S2 Abd soft CNS non focal  Labs CBC Latest Ref Rng & Units 01/11/2021 01/10/2021 01/09/2021  WBC 4.0 - 10.5 K/uL 8.6 7.9 9.7  Hemoglobin 13.0 - 17.0 g/dL 11.1(L) 10.2(L) 9.2(L)  Hematocrit 39.0 - 52.0 % 34.5(L) 31.2(L) 28.4(L)  Platelets 150 - 400 K/uL 392 361 354   CMP Latest Ref Rng & Units 01/13/2021 01/12/2021 01/11/2021  Glucose 70 - 99 mg/dL 133(H) 153(H) 153(H)  BUN 6 - 20 mg/dL 28(H) 29(H) 24(H)  Creatinine 0.61 - 1.24 mg/dL 1.17 1.26(H) 1.15  Sodium 135 - 145 mmol/L 136 136 137  Potassium 3.5 - 5.1 mmol/L 4.6 4.7 4.3  Chloride 98 - 111 mmol/L 103 101 103  CO2 22 - 32 mmol/L 23 26 27   Calcium 8.9 - 10.3 mg/dL 8.4(L) 8.5(L) 8.3(L)  Total Protein 6.5 - 8.1 g/dL - - 6.0(L)  Total Bilirubin 0.3 - 1.2 mg/dL - - 0.6  Alkaline Phos 38 - 126 U/L - - 54  AST 15 - 41 U/L - - 30  ALT 0 - 44 U/L - - 60(H)    Impression/recommendation Legionella pneumonia involving bilateral lungs  Acute hypoxic respiratory failure secondary to Legionella pneumonia.  Currently on 6 L of oxygen and is improving every day.  He is on day 8 of antibiotic.  We will give  Levaquin until 01/15/2021.  Diabetes mellitus on insulin Anxiety on Xanax as needed  Discussed the management with the patient and his wife in detail. ID will sign off.  Call if needed.

## 2021-01-13 NOTE — Progress Notes (Signed)
Physical Therapy Treatment Patient Details Name: Jermaine Harris MRN: [id number] DOB: 1960-12-15 Today's Date: 01/13/2021    History of Present Illness 60 y.o. male with a history of HTN, DM presents with fever of 1 weeks duration cough and worsening sob of 2 days after traveling from [place].  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 Left on 1/13 returned to the [place] on 12/26/20. He was found to be acutely hypoxemic in respiratory distress requiring HFNC.    PT Comments    Pt is making good progress towards goals with ambulation performed on 6L of O2 this date. O2 sats WNL. Slightly increased gait speed noted this session. Will continue to progress as able. Encouraged/educated pt and family on frequency and duration of activity.   Follow Up Recommendations  No PT follow up (lungworks program)     Equipment Recommendations  None recommended by PT    Recommendations for Other Services       Precautions / Restrictions Precautions Precautions: None Restrictions Weight Bearing Restrictions: No    Mobility  Bed Mobility Overal bed mobility: Independent             General bed mobility comments: safe technique  Transfers Overall transfer level: Independent Equipment used: None             General transfer comment: safe technique  Ambulation/Gait Ambulation/Gait assistance: Supervision Gait Distance (Feet): 2500 Feet Assistive device: None Gait Pattern/deviations: Step-through pattern     General Gait Details: safe technique with all mobility performed on 6L of O2 with O2 sats at 94-97%. HR at 127bpm with exertion. Slightly increased gait speed noted this date with reciprocal gait pattern.   Stairs             Wheelchair Mobility    Modified Rankin (Stroke Patients Only)       Balance Overall balance assessment: Mild deficits observed, not formally tested                                           Cognition Arousal/Alertness: Awake/alert Behavior During Therapy: WFL for tasks assessed/performed Overall Cognitive Status: Within Functional Limits for tasks assessed                                        Exercises      General Comments        Pertinent Vitals/Pain Pain Assessment: No/denies pain    Home Living                      Prior Function            PT Goals (current goals can now be found in the care plan section) Acute Rehab PT Goals Patient Stated Goal: to go home PT Goal Formulation: With patient Time For Goal Achievement: 01/23/21 Potential to Achieve Goals: Good Progress towards PT goals: Progressing toward goals    Frequency    Min 2X/week      PT Plan Current plan remains appropriate    Co-evaluation              AM-PAC PT "6 Clicks" Mobility   Outcome Measure  Help needed turning from your back to your side while in a  flat bed without using bedrails?: None Help needed moving from lying on your back to sitting on the side of a flat bed without using bedrails?: None Help needed moving to and from a bed to a chair (including a wheelchair)?: None Help needed standing up from a chair using your arms (e.g., wheelchair or bedside chair)?: None Help needed to walk in hospital room?: None Help needed climbing 3-5 steps with a railing? : None 6 Click Score: 24    End of Session Equipment Utilized During Treatment: Oxygen Activity Tolerance: Patient tolerated treatment well Patient left: in bed Nurse Communication: Mobility status PT Visit Diagnosis: Muscle weakness (generalized) (M62.81);Difficulty in walking, not elsewhere classified (R26.2)     Time: 6283-1517 PT Time Calculation (min) (ACUTE ONLY): 23 min  Charges:  $Gait Training: 23-37 mins                     Greggory Stallion, [place], DPT [phone]    Tekeyah Santiago 01/13/2021, 4:33 PM

## 2021-01-14 DIAGNOSIS — J156 Pneumonia due to other aerobic Gram-negative bacteria: Secondary | ICD-10-CM | POA: Diagnosis not present

## 2021-01-14 DIAGNOSIS — I5033 Acute on chronic diastolic (congestive) heart failure: Secondary | ICD-10-CM | POA: Diagnosis not present

## 2021-01-14 DIAGNOSIS — J9601 Acute respiratory failure with hypoxia: Secondary | ICD-10-CM

## 2021-01-14 LAB — GLUCOSE, CAPILLARY
Glucose-Capillary: 120 mg/dL — ABNORMAL HIGH (ref 70–99)
Glucose-Capillary: 100 mg/dL — ABNORMAL HIGH (ref 70–99)
Glucose-Capillary: 97 mg/dL (ref 70–99)
Glucose-Capillary: 157 mg/dL — ABNORMAL HIGH (ref 70–99)

## 2021-01-14 LAB — MAGNESIUM: Magnesium: 2.6 mg/dL — ABNORMAL HIGH (ref 1.7–2.4)

## 2021-01-14 LAB — PHOSPHORUS: Phosphorus: 4.3 mg/dL (ref 2.5–4.6)

## 2021-01-14 NOTE — Progress Notes (Signed)
Mobility Specialist - Progress Note   01/14/21 1400  Mobility  Activity Ambulated in hall  Level of Assistance Independent  Assistive Device None  Distance Ambulated (ft) 3000 ft  Mobility Response Tolerated well  Mobility performed by Mobility specialist  $Mobility charge 1 Mobility    O2 during AMB on 3L: 98% O2 during AMB on 2L: 97% O2 during AMB on 1L: 94% O2 during AMB on RA: 91% O2 at rest on RA: 94%  Pt was lying in bed upon arrival utilizing 3L [place] O2. Pt agreed to session. Pt denied pain, nausea, and fatigue. Pt was able to exit bed independently. Pt denied dizziness. Pt began ambulation on 3L and agreed to wean as able, O2 saturations recorded above. Pt denied SOB continuously throughout activity. A short standing break was taken after ~2500' to manage HR as it increased to a max of 129 bpm. HR mostly ranged 115-119 bpm during ambulation. No s/s of distress during session. O2 desat to a low of 91% during ambulation on RA with increased gait speed. Overall, pt tolerated session well. Prior to exit, mobility was able to wean pt down to 1L with O2 sats at 98% resting, mobility monitored pt's O2 for 3 mins as he doffed [place] completely. O2 sats at 94% on RA while resting. [place] doffed but remains on 1L and in reach for pt. Pt instructed to use if needed and shows understanding. Wife present at the end of session. Pt was left in bed with all needs in reach, utilizing room air. Nurse notified of performance and current O2 use.    Kathee Delton Mobility Specialist 01/14/21, 2:15 PM

## 2021-01-14 NOTE — Progress Notes (Signed)
Pt switched back to regular [place] @ 5 lpm. O2 sat currently 93%

## 2021-01-14 NOTE — Progress Notes (Signed)
PROGRESS NOTE    Jermaine Harris  ZNB:[id number] DOB: [birth year] DOA: 01/05/2021 PCP: Ria Bush, MD   Chief complaint.  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 Patient is not sure whether any of other members have the same symptoms. Patient also reported lightheadedness and dizziness with shortness of breath. He is fully vaccinated. Infectious disease and pulmonology consulted.His presentation is consistent with Legionella pneumonia. Legionella antigen +. Antibiotics changed to Levaquin. Patient was giving IV Lasix 2/6, he had a BMP elevation of 482. Echocardiogram showed normal ejection fraction with a right ventricular pressure 36.4.   Assessment & Plan:   Active Problems:   Essential hypertension   PNA (pneumonia)   Acute respiratory failure (HCC)   Acute on chronic diastolic CHF (congestive heart failure) ([place])   AKI (acute kidney injury) ([place])   #1. Acute respiratory failure with hypoxemia secondary to pneumonia. Legionella pneumonia. Acute on chronic diastolic congestive heart failure. Mild pulmonary hypertension. Patient condition greatly improved, currently on 3 L oxygen.  He was able to walk with physical therapy without desaturation. Continue Levaquin until tomorrow. Most likely discharge home tomorrow with or without oxygen.  2.  Type 2 diabetes. Continue current regimen.  3.  Acute kidney injury P Renal function normalized.    DVT prophylaxis: Lovenox Code Status: Full Family Communication:  Disposition Plan:  .   Status is: Inpatient  Remains  inpatient appropriate because:Inpatient level of care appropriate due to severity of illness   Dispo: The patient is from: Home              Anticipated d/c is to: Home              Anticipated d/c date is: 1 day              Patient currently is not medically stable to d/c.   Difficult to place patient No        I/O last 3 completed shifts: In: 110 [IV Piggyback:110] Out: 700 [Urine:700] Total I/O In: 3 [I.V.:3] Out: 325 [Urine:325]     Consultants:   Pulm, ID  Procedures: None  Antimicrobials:  Levaquin  Subjective: Patient condition greatly improved.  Short of breath much better.  Cough also better.  Patient was able to ambulate without desaturation. No fever chills  No abdominal pain or nausea vomiting.  Objective: Vitals:   01/14/21 0834 01/14/21 1147 01/14/21 1151 01/14/21 1201  BP:  (!) 108/93 100/69   Pulse:  97 88   Resp:  18    Temp:  97.6 F (36.4 C)    TempSrc:  Oral    SpO2: 98% 94%  93%  Weight:      Height:        Intake/Output Summary (Last 24 hours) at 01/14/2021 1251 Last data filed at 01/14/2021 0933 Gross per 24 hour  Intake 113 ml  Output 525 ml  Net -412 ml   Filed Weights   01/12/21 0435 01/12/21 2057 01/14/21 0300  Weight: 92 kg 92.9 kg 94.3 kg    Examination:  General exam: Appears calm and comfortable  Respiratory system: Clear to auscultation. Respiratory effort normal. Cardiovascular system: S1 & S2 heard,  RRR. No JVD, murmurs, rubs, gallops or clicks. No pedal edema. Gastrointestinal system: Abdomen is nondistended, soft and nontender. No organomegaly or masses felt. Normal bowel sounds heard. Central nervous system: Alert and oriented. No focal neurological deficits. Extremities: Symmetric 5 x 5 power. Skin: No rashes, lesions or ulcers Psychiatry: Judgement and insight appear normal. Mood & affect appropriate.     Data Reviewed: I have personally reviewed following labs and imaging studies  CBC: Recent Labs   Lab 01/08/21 0344 01/09/21 0244 01/10/21 0354 01/11/21 0336  WBC 13.7* 9.7 7.9 8.6  HGB 9.8* 9.2* 10.2* 11.1*  HCT 29.4* 28.4* 31.2* 34.5*  MCV 84.5 85.0 84.8 86.0  PLT 300 354 361 371   Basic Metabolic Panel: Recent Labs  Lab 01/09/21 0244 01/10/21 0354 01/11/21 0336 01/12/21 0631 01/13/21 0423 01/14/21 0422  NA 135 136 137 136 136  --   K 4.2 4.4 4.3 4.7 4.6  --   CL 106 105 103 101 103  --   CO2 21* 24 27 26 23   --   GLUCOSE 192* 120* 153* 153* 133*  --   BUN 30* 18 24* 29* 28*  --   CREATININE 0.91 0.97 1.15 1.26* 1.17  --   CALCIUM 7.7* 8.2* 8.3* 8.5* 8.4*  --   MG 2.0 2.1 2.1 2.4 2.4 2.6*  PHOS 3.3 4.7* 4.2 4.0 3.9 4.3   GFR: Estimated Creatinine Clearance: 73.8 mL/min (by C-G formula based on SCr of 1.17 mg/dL). Liver Function Tests: Recent Labs  Lab 01/08/21 0344 01/09/21 0244 01/10/21 0354 01/11/21 0336  AST 27 48* 39 30  ALT 44 63* 58* 60*  ALKPHOS 60 52 51 54  BILITOT 0.4 0.6 0.6 0.6  PROT 5.6* 5.6* 5.5* 6.0*  ALBUMIN 2.3* 2.4* 2.4* 2.7*   No results for input(s): LIPASE, AMYLASE in the last 168 hours. No results for input(s): AMMONIA in the last 168 hours. Coagulation Profile: No results for input(s): INR, PROTIME in the last 168 hours. Cardiac Enzymes: No results for input(s): CKTOTAL, CKMB, CKMBINDEX, TROPONINI in the last 168 hours. BNP (last 3 results) No results for input(s): PROBNP in the last 8760 hours. HbA1C: No results for input(s): HGBA1C in the last 72 hours. CBG: Recent Labs  Lab 01/13/21 1124 01/13/21 1634 01/13/21 1957 01/14/21 0736 01/14/21 1147  GLUCAP 128* 146* 103* 120* 100*   Lipid Profile: No results for input(s): CHOL, HDL, LDLCALC, TRIG, CHOLHDL, LDLDIRECT in the last 72 hours. Thyroid Function Tests: No results for input(s): TSH, T4TOTAL, FREET4, T3FREE, THYROIDAB in the last 72 hours. Anemia Panel: No results for input(s): VITAMINB12, FOLATE, FERRITIN, TIBC, IRON, RETICCTPCT in the last 72 hours. Sepsis  Labs: Recent Labs  Lab 01/08/21 0344 01/09/21 0244  PROCALCITON 0.57 0.24    Recent Results (from the past 240 hour(s))  Culture, blood (single) w Reflex to ID Panel     Status: None   Collection Time: 01/04/21  1:18 PM   Specimen: Blood  Result Value Ref Range Status   MICRO NUMBER: [id number]  Final   SPECIMEN QUALITY: Suboptimal  Final   Source NOT GIVEN  Final   STATUS: FINAL  Final   Result:   Final    No growth after 5 days Inspection of blood culture bottles indicates that an inadequate volume of blood may have been collected for the detection of sepsis.   COMMENT: Aerobic and anaerobic bottle received.  Final  Culture, blood (single) w Reflex to ID Panel     Status:  None   Collection Time: 01/04/21  1:18 PM   Specimen: Blood  Result Value Ref Range Status   MICRO NUMBER: [id number]  Final   SPECIMEN QUALITY: Suboptimal  Final   Source BLOOD  Final   STATUS: FINAL  Final   Result:   Final    No growth after 5 days Inspection of blood culture bottles indicates that an inadequate volume of blood may have been collected for the detection of sepsis.   COMMENT: Aerobic and anaerobic bottle received.  Final  SARS Coronavirus 2 by RT PCR (hospital order, performed in [facility] hospital lab) Nasopharyngeal Nasopharyngeal Swab     Status: None   Collection Time: 01/05/21  2:32 PM   Specimen: Nasopharyngeal Swab  Result Value Ref Range Status   SARS Coronavirus 2 NEGATIVE NEGATIVE Final    Comment: (NOTE) SARS-CoV-2 target nucleic acids are NOT DETECTED.  The SARS-CoV-2 RNA is generally detectable in upper and lower respiratory specimens during the acute phase of infection. The lowest concentration of SARS-CoV-2 viral copies this assay can detect is 250 copies / mL. A negative result does not preclude SARS-CoV-2 infection and should not be used as the sole basis for treatment or other patient management decisions.  A negative result may occur with improper specimen collection  / handling, submission of specimen other than nasopharyngeal swab, presence of viral mutation(s) within the areas targeted by this assay, and inadequate number of viral copies (<250 copies / mL). A negative result must be combined with clinical observations, patient history, and epidemiological information.  Fact Sheet for Patients:   StrictlyIdeas.no  Fact Sheet for Healthcare Providers: BankingDealers.co.za  This test is not yet approved or  cleared by the [place] FDA and has been authorized for detection and/or diagnosis of SARS-CoV-2 by FDA under an Emergency Use Authorization (EUA).  This EUA will remain in effect (meaning this test can be used) for the duration of the COVID-19 declaration under Section 564(b)(1) of the Act, 21 U.S.C. section 360bbb-3(b)(1), unless the authorization is terminated or revoked sooner.  Performed at [facility], [place], [place] [address]   Respiratory (~20 pathogens) panel by PCR     Status: None   Collection Time: 01/05/21  5:14 PM   Specimen: Flu Kit Nasopharyngeal Swab; Respiratory  Result Value Ref Range Status   Adenovirus NOT DETECTED NOT DETECTED Final   Coronavirus 229E NOT DETECTED NOT DETECTED Final    Comment: (NOTE) The Coronavirus on the Respiratory Panel, DOES NOT test for the novel  Coronavirus (2019 nCoV)    Coronavirus HKU1 NOT DETECTED NOT DETECTED Final   Coronavirus NL63 NOT DETECTED NOT DETECTED Final   Coronavirus OC43 NOT DETECTED NOT DETECTED Final   Metapneumovirus NOT DETECTED NOT DETECTED Final   Rhinovirus / Enterovirus NOT DETECTED NOT DETECTED Final   Influenza A NOT DETECTED NOT DETECTED Final   Influenza B NOT DETECTED NOT DETECTED Final   Parainfluenza Virus 1 NOT DETECTED NOT DETECTED Final   Parainfluenza Virus 2 NOT DETECTED NOT DETECTED Final   Parainfluenza Virus 3 NOT DETECTED NOT DETECTED Final   Parainfluenza Virus 4 NOT  DETECTED NOT DETECTED Final   Respiratory Syncytial Virus NOT DETECTED NOT DETECTED Final   Bordetella pertussis NOT DETECTED NOT DETECTED Final   Bordetella Parapertussis NOT DETECTED NOT DETECTED Final   Chlamydophila pneumoniae NOT DETECTED NOT DETECTED Final   Mycoplasma pneumoniae NOT DETECTED NOT DETECTED Final    Comment: Performed at [facility]  Lab, [address] [place], [place] [address]  MRSA PCR Screening     Status: None   Collection Time: 01/06/21  4:01 AM   Specimen: Urine, Clean Catch; Nasopharyngeal  Result Value Ref Range Status   MRSA by PCR NEGATIVE NEGATIVE Final    Comment:        The GeneXpert MRSA Assay (FDA approved for NASAL specimens only), is one component of a comprehensive MRSA colonization surveillance program. It is not intended to diagnose MRSA infection nor to guide or monitor treatment for MRSA infections. Performed at [facility], [address], [place], [place] [address]          Radiology Studies: No results found.      Scheduled Meds: . acidophilus  1 capsule Oral Daily  . chlorpheniramine-HYDROcodone  5 mL Oral Q12H  . citalopram  20 mg Oral Daily  . enoxaparin (LOVENOX) injection  0.5 mg/kg Subcutaneous Q24H  . gabapentin  300 mg Oral QHS  . guaiFENesin  600 mg Oral BID  . insulin aspart  0-5 Units Subcutaneous QHS  . insulin aspart  0-9 Units Subcutaneous TID WC  . insulin glargine  20 Units Subcutaneous Daily  . ipratropium-albuterol  3 mL Nebulization Q4H  . irbesartan  150 mg Oral Daily  . mometasone-formoterol  2 puff Inhalation BID  . sodium chloride flush  3 mL Intravenous Q12H   Continuous Infusions: . sodium chloride Stopped (01/10/21 1919)  . levofloxacin (LEVAQUIN) IV 100 mL/hr at 01/13/21 2016     LOS: 8 days    Time spent: 27 minutes    Sharen Hones, MD [facility]   To contact the attending provider between 7A-7P or the covering provider during after hours 7P-7A, please  log into the web site [email or web address] and access using universal [place] password for that web site. If you do not have the password, please call the hospital operator.  01/14/2021, 12:51 PM

## 2021-01-14 NOTE — Progress Notes (Signed)
Physical Therapy Treatment Patient Details Name: Jermaine Harris MRN: [id number] DOB: [birth year] Today's Date: 01/14/2021    History of Present Illness 60 y.o. male with a history of HTN, DM presents with fever of 1 weeks duration cough and worsening sob of 2 days after traveling from [place].  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 Left on 1/13 returned to the [place] on 12/26/20. He was found to be acutely hypoxemic in respiratory distress requiring HFNC.    PT Comments    Pt is making excellent progress towards goals with improved ambulation distance and speed on decreased level of O2. Pt calm and pleasant throughout session with ability to maintain conversation with exertion. Will continue to progress as able.   Follow Up Recommendations  No PT follow up     Equipment Recommendations  None recommended by PT    Recommendations for Other Services       Precautions / Restrictions Precautions Precautions: None Restrictions Weight Bearing Restrictions: No    Mobility  Bed Mobility Overal bed mobility: Independent             General bed mobility comments: safe technique    Transfers Overall transfer level: Independent Equipment used: None             General transfer comment: safe technique  Ambulation/Gait Ambulation/Gait assistance: Modified independent (Device/Increase time) Gait Distance (Feet): 3000 Feet Assistive device: None Gait Pattern/deviations: Step-through pattern     General Gait Details: Increased gait speed this date with reciprocal gait pattern. Vitals monitored throughout. All mobility performed on 3L of O2 with sats at 91% and HR at 117 post exertion.   Stairs             Wheelchair Mobility    Modified Rankin (Stroke Patients Only)       Balance Overall balance assessment: Mild deficits observed, not formally tested                                           Cognition Arousal/Alertness: Awake/alert Behavior During Therapy: WFL for tasks assessed/performed Overall Cognitive Status: Within Functional Limits for tasks assessed                                        Exercises      General Comments        Pertinent Vitals/Pain Pain Assessment: No/denies pain    Home Living                      Prior Function            PT Goals (current goals can now be found in the care plan section) Acute Rehab PT Goals Patient Stated Goal: to go home PT Goal Formulation: With patient Time For Goal Achievement: 01/23/21 Potential to Achieve Goals: Good Progress towards PT goals: Progressing toward goals    Frequency    Min 2X/week      PT Plan Current plan remains appropriate    Co-evaluation              AM-PAC PT "6 Clicks" Mobility   Outcome Measure  Help needed turning from your back to your side while in a flat bed without using  bedrails?: None Help needed moving from lying on your back to sitting on the side of a flat bed without using bedrails?: None Help needed moving to and from a bed to a chair (including a wheelchair)?: None Help needed standing up from a chair using your arms (e.g., wheelchair or bedside chair)?: None Help needed to walk in hospital room?: None Help needed climbing 3-5 steps with a railing? : None 6 Click Score: 24    End of Session Equipment Utilized During Treatment: Oxygen Activity Tolerance: Patient tolerated treatment well Patient left: in bed Nurse Communication: Mobility status PT Visit Diagnosis: Muscle weakness (generalized) (M62.81);Difficulty in walking, not elsewhere classified (R26.2)     Time: 4599-7741 PT Time Calculation (min) (ACUTE ONLY): 23 min  Charges:  $Gait Training: 23-37 mins                     Greggory Stallion, [place], DPT [phone]    Jermaine Harris 01/14/2021, 2:59 PM

## 2021-01-14 NOTE — Progress Notes (Signed)
NAME:  Jermaine Harris, MRN:  [id number], DOB:  [birth year], LOS: 8 ADMISSION DATE:  01/05/2021, CONSULTATION DATE:  01/06/2021 REFERRING MD:  Dr. Dwyane Dee, CHIEF COMPLAINT:  Cough and Fevers  Brief History:  60 y.o. Male with recent international travel to [place] who presented on 01/05/21 with flulike symptoms (myalgia, fever, chills, and cough).  Pt now with Acute Hypoxic Respiratory Failure in the setting of Bilateral Legionella Pneumonia involving the lower lobes.   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   He left for [place] on 12/17/20 and reaced there eon 12/18/20 , stayed in [place] for a week, mostly had meeting in the hotel and visited certain places like [employer] .   He spent a day in [place] and returned to the [place] on 12/26/20.  - went to work on 1/24, 1/25.   1/24-1/25 cough 1/26 high fevers 1/27 COVID rapid AG test NEG Started ABX 1/31 . He had a televist with his PCP Dr.Gutierrez on 1/31 and blood culture sent, CXR showed rt lower lobe opacity  2/1 ID and PULMONARY CONSULTED   ER COURSE CXR showed RLL opacity COVID test neg Started on ceftriaxone and azithromycin  no sick contacts Triple vaccinated for covid Placed on High flow [place] H[facility] 45L fio2 at 75%-feels better with HHF  Past Medical History:  Actinic Keratosis Claustrophobia Generalized Anxiety Disorder Hypertension Irritable Bowel Syndrome Lumbar Herniated Disc (06/2015) Seasonal Allergies  Significant Hospital Events:  2/1: Presented to ED, Hospitalist to admit to Stepdown 2/1: ID & Pulmonary Consulted 01/09/2021- patient is tired today, he reports inability to sleep overnight.  Discussed with Dr Dwyane Dee today and we agreed on stopping steroids today.  I have  encouraged him to attempt PT and OT again today.  He is weaned to 13L/min.  Bloodwork stable with resolution of leukocytosis and trending down procalcitonin.  01/10/2021- Patient examined at bedside.  He is clinically imporved but remains on 13-15L/min [place].  He had spirometry done today.  Additionally he has workup in process to investigate cardiac function.  His bloodwork shows BMP is within reference range, CBC with resolution of leucocytosis. He continues to work with PT.   01/10/21: BNP 482, received IV Lasix 01/12/2021- Patient examined at bedside. Clinically imporved.  He was diuresed s/p 5L net negative.  He had O2 weaned from 15L to 34.  BMP is with prerenal AKI mild stage 1 likely treansient post diuretic.   He had repeat CT with PE protocol, improved infiltration of both lungs at bases with residual infiltrates worse at bases posteriorly.  He is working with PT. 01/13/21- Weaned to 8L [place], slowly improving 01/14/21: Pt ambulated with PT yesterday on 6L and able to maintain O2 sats 94-97%; weaned to 4L [place] this morning and tolerating well  Consults:  Hospitalist (primary service) ID Pulmonary  Procedures:  N/A  Significant Diagnostic Tests:  2/1: Chest x-ray>>Right lower lobe infiltrate posteriorly unchanged from the prior study. No significant pleural effusion. Mild left lower lobe airspace disease unchanged. Negative for heart failure 2/2: CT chest without contrast>>1. Bilateral lower lobe airspace consolidation with air bronchograms consistent with multifocal pneumonia. 2. Hepatic steatosis. 3. Aortic atherosclerosis. 2/3: Echocardiogram>>1. Left ventricular ejection fraction, by estimation, is 55 to 60%. The  left ventricle has normal function. The left ventricle has no regional  wall motion abnormalities. Left ventricular diastolic parameters are  consistent with Grade II diastolic  dysfunction (pseudonormalization).  2. Right ventricular systolic function is normal. The right  ventricular  size is normal. There is mildly elevated pulmonary artery systolic  pressure. The estimated right ventricular systolic pressure is 21.1 mmHg.  3. Left atrial size was mildly dilated.  4. The mitral valve is normal in structure. Mild to moderate mitral valve  regurgitation.  2/8: CTA Chest>>Bibasilar consolidation with small effusions. No evidence of pulmonary emboli. Changes of prior granulomatous disease.  Micro Data:  1/27: SARS-CoV-2 Ag>> negative 1/27: Influenza A&B POC>> negative 1/27: Novel coronavirus NAA>> not detected 1/31: Blood culture x2>> no growth to date 2/1: SARS-CoV-2 PCR>> negative 2/1: Respiratory viral panel>> negative 2/1: HIV screen>> nonreactive 2/2: MRSA PCR>> negative 2/2: Strep pneumo urinary antigen>> negative 2/2: Legionella urinary antigen>>POSITIVE  Antimicrobials:  Azithromycin 2/1>>2/4 Ceftriaxone 2/1>>2/4 Doxycycline 2/2>>2/3 Levofloxacin 2/4>>  Interim History / Subjective:  -Afebrile, Hemodynamically stable -Weaned to 4L [place] with O2 sats 95% -Net negative 5.7 L since admission, urine output 700 ml yesterday -No events reported overnight -Denies chest pain, SOB, wheezing, abdominal pain, N/V/D, fever, chills, edema -Reports intermittent nonproductive cough    Objective   Blood pressure 108/71, pulse 69, temperature 98.6 F (37 C), temperature source Oral, resp. rate 18, height 5' 7.25" (1.708 m), weight 94.3 kg, SpO2 96 %.        Intake/Output Summary (Last 24 hours) at 01/14/2021 0745 Last data filed at 01/13/2021 2148 Gross per 24 hour  Intake 110 ml  Output 700 ml  Net -590 ml   Filed Weights   01/12/21 0435 01/12/21 2057 01/14/21 0300  Weight: 92 kg 92.9 kg 94.3 kg    Examination: General: Acutely ill-appearing male, sitting in bed, on 4 L nasal cannula, no acute distress HENT: Atraumatic, normocephalic, neck supple, no JVD Lungs: Clear to auscultation throughout, no wheezing or rales noted, even, nonlabored,  normal effort Cardiovascular: Regular rate and rhythm, S1-S2, no murmurs, rubs, gallops, 2+ distal pulses Abdomen: Soft, nontender, nondistended, no guarding or rebound tenderness, bowel sounds positive x4 Extremities: Normal bulk and tone, no deformities, no clubbing, no edema Neuro: Awake, alert and oriented x4, follows commands, no focal deficits, speech clear Skin: Warm and dry.  No obvious rashes, lesions, ulcerations  Resolved Hospital Problem list   N/A  Assessment & Plan:   Acute Hypoxic Respiratory Failure in the setting of Bilateral Legionella Pneumonia -Supplemental O2 as needed to maintain O2 saturations greater than 92% -Currently tolerating [place] 8L -Follow intermittent chest x-ray and ABG as needed -CTA Chest on 01/12/21 with Bibasilar consolidation with small effusions. No evidence of pulmonary emboli. Changes of prior granulomatous disease -Bronchodilators -Completed course of Steroids -Continue Dulera -Incentive spirometry and flutter valve -Continue Levaquin as per ID   Bilateral Legionella Pneumonia -Monitor fever curve -Trend WBC's -Follow cultures as above  -ID following, appreciate input -Antibiotics as per ID (currently on Levaquin, to complete course on 01/15/21)   Acute on Chronic HFpEF Mild Pulmonary Hypertension -Monitor volume status -Diuresis as indicated (last received lasix on 2/7) -Echo on 01/07/21 with LVEF 55-60%, Grade II Diastolic dysfunction, mild pulmonary artery htn   AKI>>improved -Monitor I&O's / urinary output -Follow BMP -Ensure adequate renal perfusion -Avoid nephrotoxic agents as able -Replace electrolytes as indicated   Diabetes Mellitus -CBG's -SSI and Lantus 20 units daily     PCCM will sign off  at this time.  Please re-consult if we can be of further assistance.    Best practice (evaluated daily)  Diet: Heart Healthy/Carb Modified Pain/Anxiety/Delirium protocol (if indicated): Prn Morphine, Xanax VAP protocol (if  indicated): N/A DVT prophylaxis: Lovenox GI prophylaxis: N/A Glucose control: SSI Mobility: As tolerated Disposition: Med/Surg  Goals of Care:  Last date of multidisciplinary goals of care discussion: N/A Family and staff present: Updated pt at bedside 01/14/2021 Summary of discussion: Continue ABX, prn Bronchodilators, wean O2 as tolerated Follow up goals of care discussion due: N/A Code Status: Full Code  Labs   CBC: Recent Labs  Lab 01/08/21 0344 01/09/21 0244 01/10/21 0354 01/11/21 0336  WBC 13.7* 9.7 7.9 8.6  HGB 9.8* 9.2* 10.2* 11.1*  HCT 29.4* 28.4* 31.2* 34.5*  MCV 84.5 85.0 84.8 86.0  PLT 300 354 361 314    Basic Metabolic Panel: Recent Labs  Lab 01/09/21 0244 01/10/21 0354 01/11/21 0336 01/12/21 0631 01/13/21 0423 01/14/21 0422  NA 135 136 137 136 136  --   K 4.2 4.4 4.3 4.7 4.6  --   CL 106 105 103 101 103  --   CO2 21* 24 27 26 23   --   GLUCOSE 192* 120* 153* 153* 133*  --   BUN 30* 18 24* 29* 28*  --   CREATININE 0.91 0.97 1.15 1.26* 1.17  --   CALCIUM 7.7* 8.2* 8.3* 8.5* 8.4*  --   MG 2.0 2.1 2.1 2.4 2.4 2.6*  PHOS 3.3 4.7* 4.2 4.0 3.9 4.3   GFR: Estimated Creatinine Clearance: 73.8 mL/min (by C-G formula based on SCr of 1.17 mg/dL). Recent Labs  Lab 01/08/21 0344 01/09/21 0244 01/10/21 0354 01/11/21 0336  PROCALCITON 0.57 0.24  --   --   WBC 13.7* 9.7 7.9 8.6    Liver Function Tests: Recent Labs  Lab 01/08/21 0344 01/09/21 0244 01/10/21 0354 01/11/21 0336  AST 27 48* 39 30  ALT 44 63* 58* 60*  ALKPHOS 60 52 51 54  BILITOT 0.4 0.6 0.6 0.6  PROT 5.6* 5.6* 5.5* 6.0*  ALBUMIN 2.3* 2.4* 2.4* 2.7*   No results for input(s): LIPASE, AMYLASE in the last 168 hours. No results for input(s): AMMONIA in the last 168 hours.  ABG No results found for: PHART, PCO2ART, PO2ART, HCO3, TCO2, ACIDBASEDEF, O2SAT   Coagulation Profile: No results for input(s): INR, PROTIME in the last 168 hours.  Cardiac Enzymes: No results for input(s):  CKTOTAL, CKMB, CKMBINDEX, TROPONINI in the last 168 hours.  HbA1C: Hemoglobin A1C  Date/Time Value Ref Range Status  10/13/2020 09:59 AM 7.2 (A) 4.0 - 5.6 % Final  06/25/2020 12:19 PM 7.1 (A) 4.0 - 5.6 % Final   Hgb A1c MFr Bld  Date/Time Value Ref Range Status  01/05/2021 02:26 PM 7.8 (H) 4.8 - 5.6 % Final    Comment:    (NOTE) Pre diabetes:          5.7%-6.4%  Diabetes:              >6.4%  Glycemic control for   <7.0% adults with diabetes   12/28/2018 08:23 AM 6.6 (H) 4.6 - 6.5 % Final    Comment:    Glycemic Control Guidelines for People with Diabetes:Non Diabetic:  <6%Goal of Therapy: <7%Additional Action Suggested:  >8%     CBG: Recent Labs  Lab 01/12/21 2106 01/13/21 0751 01/13/21 1124 01/13/21 1634 01/13/21 1957  GLUCAP 117* 117* 128* 146* 103*    Review of Systems:  Positives in BOLD: Gen: Denies fever, chills, weight change, fatigue, night sweats HEENT: Denies blurred vision, double vision, hearing loss, tinnitus, sinus congestion, rhinorrhea, sore throat, neck stiffness, dysphagia PULM: Denies shortness of breath, +cough, sputum production, hemoptysis, wheezing CV: Denies chest pain, edema, orthopnea, paroxysmal nocturnal dyspnea, palpitations GI: Denies abdominal pain, nausea, vomiting, diarrhea, hematochezia, melena, constipation, change in bowel habits GU: Denies dysuria, hematuria, polyuria, oliguria, urethral discharge Endocrine: Denies hot or cold intolerance, polyuria, polyphagia or appetite change Derm: Denies rash, dry skin, scaling or peeling skin change Heme: Denies easy bruising, bleeding, bleeding gums Neuro: Denies headache, numbness, weakness, slurred speech, loss of memory or consciousness   Past Medical History:  He,  has a past medical history of Actinic keratosis, Claustrophobia, GAD (generalized anxiety disorder), HTN (hypertension), Irritable bowel syndrome, Lumbar herniated disc (06/2015), and Seasonal allergies.   Surgical History:    Past Surgical History:  Procedure Laterality Date  . COLON SURGERY    . COLONOSCOPY  2012   mild diverticulosis, rec rpt 5 yrs 2/2 fmhx polyps  . COLONOSCOPY WITH PROPOFOL N/A 05/29/2019   TA, diverticulosis, rpt 5 yrs (Bonna Gains, Lennette Bihari, MD)  . MICRODISCECTOMY LUMBAR  07/2015   L4/5/S1 [place]on Gammon)  . TONSILLECTOMY       Social History:   reports that he has never smoked. He has never used smokeless tobacco. He reports current alcohol use. He reports that he does not use drugs.   Family History:  His family history includes Colon polyps in his sister; Depression in his father and mother; Heart disease in his father; Sudden death (age of onset: 21) in his maternal grandfather; Transient ischemic attack (age of onset: 58) in his mother.   Allergies Allergies  Allergen Reactions  . Ace Inhibitors Cough     Home Medications  Prior to Admission medications   Medication Sig Start Date End Date Taking? Authorizing Provider  ALPRAZolam (NIRAVAM) 0.5 MG dissolvable tablet Take 1 tablet (0.5 mg total) by mouth daily as needed for anxiety (plane rides). 02/22/18   Ria Bush, MD  amoxicillin-clavulanate (AUGMENTIN) 875-125 MG tablet Take 1 tablet by mouth 2 (two) times daily. 01/02/21   [provider]  citalopram (CELEXA) 20 MG tablet Take 1 tablet (20 mg total) by mouth daily. 06/25/20   Ria Bush, MD  gabapentin (NEURONTIN) 300 MG capsule TAKE 1 CAPSULE BY MOUTH EVERYDAY AT BEDTIME 01/03/20   Ria Bush, MD  ibuprofen (ADVIL,MOTRIN) 200 MG tablet Take 200 mg by mouth as needed.    [provider]  metFORMIN (GLUCOPHAGE XR) 500 MG 24 hr tablet Take 1 tablet (500 mg total) by mouth daily. 10/13/20   Ria Bush, MD  valsartan-hydrochlorothiazide (DIOVAN-HCT) 160-12.5 MG tablet TAKE 1 TABLET BY MOUTH EVERY DAY 12/30/20   Ria Bush, MD     Critical care time: 37 minutes    Darel Hong, [facility]  Medicine Pager: [phone]

## 2021-01-15 DIAGNOSIS — J156 Pneumonia due to other aerobic Gram-negative bacteria: Secondary | ICD-10-CM | POA: Diagnosis not present

## 2021-01-15 DIAGNOSIS — N179 Acute kidney failure, unspecified: Secondary | ICD-10-CM | POA: Diagnosis not present

## 2021-01-15 DIAGNOSIS — J9601 Acute respiratory failure with hypoxia: Secondary | ICD-10-CM | POA: Diagnosis not present

## 2021-01-15 DIAGNOSIS — I5033 Acute on chronic diastolic (congestive) heart failure: Secondary | ICD-10-CM | POA: Diagnosis not present

## 2021-01-15 LAB — CBC
HCT: 36.1 % — ABNORMAL LOW (ref 39.0–52.0)
Hemoglobin: 11.7 g/dL — ABNORMAL LOW (ref 13.0–17.0)
MCH: 27.9 pg (ref 26.0–34.0)
MCHC: 32.4 g/dL (ref 30.0–36.0)
MCV: 86 fL (ref 80.0–100.0)
Platelets: 375 10*3/uL (ref 150–400)
RBC: 4.2 MIL/uL — ABNORMAL LOW (ref 4.22–5.81)
RDW: 13.3 % (ref 11.5–15.5)
WBC: 5.7 10*3/uL (ref 4.0–10.5)
nRBC: 0 % (ref 0.0–0.2)

## 2021-01-15 LAB — MAGNESIUM: Magnesium: 2.4 mg/dL (ref 1.7–2.4)

## 2021-01-15 LAB — GLUCOSE, CAPILLARY
Glucose-Capillary: 190 mg/dL — ABNORMAL HIGH (ref 70–99)
Glucose-Capillary: 94 mg/dL (ref 70–99)

## 2021-01-15 LAB — PHOSPHORUS: Phosphorus: 4 mg/dL (ref 2.5–4.6)

## 2021-01-15 MED ORDER — ALBUTEROL SULFATE HFA 108 (90 BASE) MCG/ACT IN AERS: 2.0000 | INHALATION_SPRAY | Freq: Four times a day (QID) | RESPIRATORY_TRACT | 0 refills | Status: DC | PRN

## 2021-01-15 MED ORDER — LEVOFLOXACIN 500 MG PO TABS
750.0000 mg | ORAL_TABLET | Freq: Once | ORAL | Status: AC
  Administered 2021-01-15: 750 mg via ORAL
  Filled 2021-01-15: qty 2

## 2021-01-15 MED ORDER — GUAIFENESIN ER 600 MG PO TB12: 600.0000 mg | ORAL_TABLET | Freq: Two times a day (BID) | ORAL | 0 refills | Status: DC

## 2021-01-15 MED ORDER — TRAZODONE HCL 50 MG PO TABS: 50.0000 mg | ORAL_TABLET | Freq: Every day | ORAL | 0 refills | Status: DC

## 2021-01-15 MED ORDER — MOMETASONE FURO-FORMOTEROL FUM 100-5 MCG/ACT IN AERO: 2.0000 | INHALATION_SPRAY | Freq: Two times a day (BID) | RESPIRATORY_TRACT | 0 refills | Status: DC

## 2021-01-15 MED ORDER — TORSEMIDE 20 MG PO TABS: 20.0000 mg | ORAL_TABLET | ORAL | 0 refills | Status: DC

## 2021-01-15 MED ORDER — HYDROCOD POLST-CPM POLST ER 10-8 MG/5ML PO SUER: 5.0000 mL | Freq: Two times a day (BID) | ORAL | 0 refills | Status: AC

## 2021-01-15 NOTE — Progress Notes (Signed)
pt ambulated 2 laps and oxygen was 87, rested and oxygen increased to 93. Pt walked after rest and oxygen 92-94 the entire duration heart rate increased to 115 during ambulation. Pt was able to talk in full sentences during ambulation, pt denies SHOB.

## 2021-01-15 NOTE — Discharge Summary (Addendum)
Physician Discharge Summary  Patient ID: Jermaine Harris MRN: [id number] DOB/AGE: 04/01/1961 60 y.o.  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 Patient is not sure whether any of other members have the same symptoms. Patient also reported lightheadedness and dizziness with shortness of breath. He is fully vaccinated. Infectious disease and pulmonology consulted.His presentation is consistent with Legionella pneumonia. Legionella antigen +. Antibiotics changed to Levaquin. Patient was giving IV Lasix 2/6, he had a BMP elevation of 482. Echocardiogram showed normal ejection fraction with a right ventricular pressure 36.4.   #1. Acute respiratory failure with hypoxemia secondary to pneumonia. Legionella pneumonia. Acute on chronic diastolic congestive heart failure. Mild pulmonary hypertension. Patient is treated with the Levaquin for Legionella pneumonia.  He was also treated with IV Lasix for 2 days. Condition much improved.  Patient off oxygen, he does not seem to be in any desaturation with physical therapy.  Will obtain home eval for oxygen use. Patient will finish up the last dose of Levaquin today with a total course of 10 days. Patient has some small amount of crackles in the base, I  will start torsemide 20 mg every other day.  Patient be referred to heart failure clinic. Patient is also advised to obtain a sleep study as outpatient when he follow-up with his family doctor or pulmonology. Patient has small amount of pleural effusion, too small to tap.  Need to follow-up with additional imaging study to make sure this not geting worse.   2.  Type 2 diabetes with hyperglycemia Patient was requiring 20 units of Lantus for hyperglycemia.  Hyperglycemia could be due to initial steroid use.  Hemoglobin A1c 7.6.  Resume home dose Metformin.  Follow-up with PCP to adjust medications.  3.  Acute kidney injury. Renal function normalized.  Please note, I reviewed the admission vital signs laboratory studies, patient did not meet sepsis criteria at that time.  Addendum: Responding to query: 2/15.  Acute kidney injury secondary to ATN.    Consults: pulmonary/intensive care  Significant Diagnostic Studies:  CT ANGIOGRAPHY CHEST WITH CONTRAST  TECHNIQUE: Multidetector CT imaging of the chest was performed using the standard protocol during bolus administration of intravenous contrast. Multiplanar CT image reconstructions and MIPs were obtained to evaluate the vascular anatomy.  CONTRAST:  24mL OMNIPAQUE IOHEXOL 350 MG/ML SOLN  COMPARISON:  Chest x-ray from earlier in the same day.  FINDINGS: Cardiovascular: Thoracic aorta and its branches demonstrate atherosclerotic calcification without aneurysmal dilatation or dissection. No cardiac enlargement is noted. The pulmonary artery shows a normal branching pattern. No filling defect is identified within the pulmonary artery to suggest pulmonary embolism.  Mediastinum/Nodes: Thoracic inlet is within normal limits. No sizable hilar or mediastinal adenopathy is noted. The esophagus as visualized is within normal limits.  Lungs/Pleura: Lungs are well aerated bilaterally with mild bibasilar consolidation and small  effusions. A few scattered calcified granulomas are noted.  No pneumothorax is seen.  Upper Abdomen: No acute abnormality.  Musculoskeletal: No acute bony abnormality is noted.  Review of the MIP images confirms the above findings.  IMPRESSION: Bibasilar consolidation with small effusions.  No evidence of pulmonary emboli.  Changes of prior granulomatous disease.   Electronically Signed   By: Inez Catalina M.D.   On: 01/12/2021 11:34  Echo: 1. Left ventricular ejection fraction, by estimation, is 55 to 60%. The left ventricle has normal function. The left ventricle has no regional wall motion abnormalities. Left ventricular diastolic parameters are consistent with Grade II diastolic dysfunction (pseudonormalization). 2. Right ventricular systolic function is normal. The right ventricular size is normal. There is mildly elevated pulmonary artery systolic pressure. The estimated right ventricular systolic pressure is 01.0 mmHg. 3. Left atrial size was mildly dilated. 4. The mitral valve is normal in structure. Mild to moderate mitral valve regurgitation.  Treatments: Antibiotics, Lasix.  Discharge Exam: Blood pressure 102/65, pulse 84, temperature 98.1 F (36.7 C), temperature source Oral, resp. rate 19, height 5' 7.25" (1.708 m), weight 94.2 kg, SpO2 93 %. General appearance: alert and cooperative Resp: crackles in bases Cardio: regular rate and rhythm, S1, S2 normal, no murmur, click, rub or gallop GI: soft, non-tender; bowel sounds normal; no masses,  no organomegaly Extremities: extremities normal, atraumatic, no cyanosis or edema  Disposition: Discharge disposition: 01-Home or Self Care       Discharge Instructions    AMB referral to CHF clinic   Complete by: As directed    Diet - low sodium heart healthy   Complete by: As directed    Increase activity slowly   Complete by: As directed      Allergies as of 01/15/2021      Reactions   Ace Inhibitors  Cough      Medication List    STOP taking these medications   amoxicillin-clavulanate 875-125 MG tablet Commonly known as: AUGMENTIN     TAKE these medications   albuterol 108 (90 Base) MCG/ACT inhaler Commonly known as: VENTOLIN HFA Inhale 2 puffs into the lungs every 6 (six) hours as needed for up to 14 days for wheezing or shortness of breath.   ALPRAZolam 0.5 MG dissolvable tablet Commonly known as: NIRAVAM Take 1 tablet (0.5 mg total) by mouth daily as needed for anxiety (plane rides).   chlorpheniramine-HYDROcodone 10-8 MG/5ML Suer Commonly known as: TUSSIONEX Take 5 mLs by mouth every 12 (twelve) hours for 3 days.   citalopram 20 MG tablet Commonly known as: CELEXA Take 1 tablet (20 mg total) by mouth daily.   gabapentin 300 MG capsule Commonly known as: NEURONTIN TAKE 1 CAPSULE BY MOUTH EVERYDAY AT BEDTIME   guaiFENesin 600 MG 12 hr tablet Commonly known as: MUCINEX Take 1 tablet (600 mg total) by mouth 2 (two) times daily for 14 days.   ibuprofen 200 MG tablet Commonly known as: ADVIL Take 200 mg by mouth as needed.   metFORMIN 500 MG 24 hr tablet Commonly known as: Glucophage XR Take 1 tablet (500 mg total) by mouth daily.   mometasone-formoterol 100-5 MCG/ACT Aero Commonly known as: DULERA Inhale 2 puffs into the lungs 2 (two) times daily.   torsemide 20 MG tablet Commonly known as: Demadex Take 1 tablet (20 mg total) by mouth every other day.   valsartan-hydrochlorothiazide 160-12.5 MG tablet Commonly known as: DIOVAN-HCT TAKE 1 TABLET BY MOUTH EVERY DAY       Follow-up Information    Ria Bush, MD Follow up  in 1 week(s).   Specialty: Family Medicine Contact information: [place]  [phone]              35 minutes Signed: Sharen Hones 01/15/2021, 9:08 AM

## 2021-01-15 NOTE — Progress Notes (Signed)
Occupational Therapy Treatment Patient Details Name: Jermaine Harris MRN: [id number] DOB: 10/26/1961 Today's Date: 01/15/2021    History of present illness 60 y.o. male with a history of HTN, DM presents with fever of 1 weeks duration cough and worsening sob of 2 days after traveling from [place].  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 Left on 1/13 returned to the [place] on 12/26/20. He was found to be acutely hypoxemic in respiratory distress requiring HFNC.   OT comments  Pt seen for OT tx this date. Pt ambulating in room independently. Pt reports just having taken walk around unit and brushed his teeth. In standing HR noted to be in 120's, RR briefly >35. SpO2 on room air >93%. Pt instructed in ECS including activity pacing, distraction techniques, work simplification, and trajectory for activity tolerance with progressive return to daily routines including driving and working. Pt verbalized understanding. Pt has met all acute OT goals at this time. Do not anticipate need for additional skilled OT services upon return home. Will sign off at this time. Please re-consult if additional acute OT need arise.    Follow Up Recommendations  No OT follow up    Equipment Recommendations  None recommended by OT    Recommendations for Other Services      Precautions / Restrictions Precautions Precautions: None Restrictions Weight Bearing Restrictions: No       Mobility Bed Mobility Overal bed mobility: Independent                Transfers Overall transfer level: Independent Equipment used: None                  Balance Overall balance assessment: Independent                                         ADL either performed or assessed with clinical judgement   ADL Overall ADL's : Modified independent                                             Vision Patient Visual Report: No change from  baseline     Perception     Praxis      Cognition Arousal/Alertness: Awake/alert Behavior During Therapy: WFL for tasks assessed/performed Overall Cognitive Status: Within Functional Limits for tasks assessed                                          Exercises Other Exercises Other Exercises: Pt instructed in ECS including activity pacing, distraction techniques, work simplification, and trajectory for activity tolerance with progressive return to daily routines including driving and working. Pt verbalized understanding.   Shoulder Instructions       General Comments      Pertinent Vitals/ Pain       Pain Assessment: No/denies pain  Home Living                                          Prior Functioning/Environment  Frequency  Min 1X/week        Progress Toward Goals  OT Goals(current goals can now be found in the care plan section)  Progress towards OT goals: Goals met/education completed, patient discharged from OT  Acute Rehab OT Goals Patient Stated Goal: to go home OT Goal Formulation: All assessment and education complete, DC therapy  Plan All goals met and education completed, patient discharged from OT services    Co-evaluation                 AM-PAC OT "6 Clicks" Daily Activity     Outcome Measure   Help from another person eating meals?: None Help from another person taking care of personal grooming?: None Help from another person toileting, which includes using toliet, bedpan, or urinal?: None Help from another person bathing (including washing, rinsing, drying)?: None Help from another person to put on and taking off regular upper body clothing?: None Help from another person to put on and taking off regular lower body clothing?: None 6 Click Score: 24    End of Session    OT Visit Diagnosis: Unsteadiness on feet (R26.81)   Activity Tolerance Patient tolerated treatment well    Patient Left in chair;with call bell/phone within reach   Nurse Communication          Time: 1000-1017 OT Time Calculation (min): 17 min  Charges: OT General Charges $OT Visit: 1 Visit OT Treatments $Self Care/Home Management : 8-22 mins  Hanley Hays, MPH, MS, OTR/L ascom [phone] 01/15/21, 10:28 AM

## 2021-01-18 ENCOUNTER — Encounter: Payer: Self-pay | Admitting: Pulmonary Disease

## 2021-01-18 ENCOUNTER — Other Ambulatory Visit
Admission: RE | Admit: 2021-01-18 | Discharge: 2021-01-18 | Disposition: A | Discharge status: Home / Self Care | Payer: BC Managed Care – PPO | Source: Ambulatory Visit | Attending: Pulmonary Disease | Admitting: Pulmonary Disease

## 2021-01-18 ENCOUNTER — Other Ambulatory Visit: Payer: Self-pay

## 2021-01-18 ENCOUNTER — Ambulatory Visit: Payer: BC Managed Care – PPO | Admitting: Pulmonary Disease

## 2021-01-18 VITALS — BP 134/78 | HR 101 | Temp 97.8°F | Ht 68.0 in | Wt 199.6 lb

## 2021-01-18 DIAGNOSIS — R0600 Dyspnea, unspecified: Secondary | ICD-10-CM

## 2021-01-18 DIAGNOSIS — A481 Legionnaires' disease: Secondary | ICD-10-CM

## 2021-01-18 DIAGNOSIS — I5189 Other ill-defined heart diseases: Secondary | ICD-10-CM | POA: Diagnosis not present

## 2021-01-18 DIAGNOSIS — R000 Tachycardia, unspecified: Secondary | ICD-10-CM

## 2021-01-18 DIAGNOSIS — D649 Anemia, unspecified: Secondary | ICD-10-CM

## 2021-01-18 DIAGNOSIS — R0609 Other forms of dyspnea: Secondary | ICD-10-CM

## 2021-01-18 LAB — CBC WITH DIFFERENTIAL/PLATELET
Abs Immature Granulocytes: 0.07 10*3/uL (ref 0.00–0.07)
Basophils Absolute: 0.1 10*3/uL (ref 0.0–0.1)
Basophils Relative: 2 %
Eosinophils Absolute: 0.3 10*3/uL (ref 0.0–0.5)
Eosinophils Relative: 4 %
HCT: 40.6 % (ref 39.0–52.0)
Hemoglobin: 13.3 g/dL (ref 13.0–17.0)
Immature Granulocytes: 1 %
Lymphocytes Relative: 26 %
Lymphs Abs: 2.1 10*3/uL (ref 0.7–4.0)
MCH: 27.7 pg (ref 26.0–34.0)
MCHC: 32.8 g/dL (ref 30.0–36.0)
MCV: 84.6 fL (ref 80.0–100.0)
Monocytes Absolute: 0.8 10*3/uL (ref 0.1–1.0)
Monocytes Relative: 10 %
Neutro Abs: 4.6 10*3/uL (ref 1.7–7.7)
Neutrophils Relative %: 57 %
Platelets: 502 10*3/uL — ABNORMAL HIGH (ref 150–400)
RBC: 4.8 MIL/uL (ref 4.22–5.81)
RDW: 13.4 % (ref 11.5–15.5)
WBC: 7.9 10*3/uL (ref 4.0–10.5)
nRBC: 0 % (ref 0.0–0.2)

## 2021-01-18 LAB — TSH: TSH: 2.725 u[IU]/mL (ref 0.350–4.500)

## 2021-01-18 LAB — IRON: Iron: 47 ug/dL (ref 45–182)

## 2021-01-18 LAB — FERRITIN: Ferritin: 237 ng/mL (ref 24–336)

## 2021-01-18 LAB — VITAMIN B12: Vitamin B-12: 966 pg/mL — ABNORMAL HIGH (ref 180–914)

## 2021-01-18 NOTE — Progress Notes (Signed)
Subjective:    Patient ID: Jermaine Harris, male    DOB: Jul 23, 1961, 60 y.o.   MRN: [id number]  HPI This is a 60 year old lifelong never smoker with history as noted below, who presents for post hospital follow-up after recent hospital discharge on 11 February for pneumonia.  The patient's primary care physician is Dr. Ria Bush.  He was evaluated during his admission by Dr. Mortimer Fries.  Presented to [facility] on 05 January 2021 with flulike symptoms (myalgia, fevers, chills and cough) after international travel to [place].  Was noted to be in hypoxic respiratory failure in the setting of bilateral pneumonia.  He was diagnosed with Legionella pneumonia flow O2 during his admission due to respiratory failure with hypoxia.  He was able to be weaned off of oxygen successfully.  He received a total of 10 days of Levaquin.  During his admission it was noted that he had low-grade anemia.  He has not had this previously worked up.  Since his discharge on 11 February has done better.  He still has issues with dyspnea on exertion which predated his admission.  He also has issues with tachycardia particularly when changing posture.  There was a concern for potential sleep apnea however patient and wife do not have a history consistent with this.  If anything his main issue is insomnia.  Does not appear to have significant snoring nor apneic episodes per wife.  Review of Systems A 10 point review of systems was performed and it is as noted above otherwise negative.  Past Medical History:  Diagnosis Date  . Actinic keratosis   . Claustrophobia   . DM II (diabetes mellitus, type II), controlled ([place])   . GAD (generalized anxiety disorder)    claustrophobia, some panic attacks  . HTN (hypertension)   . Irritable bowel syndrome   . Lumbar herniated disc 06/2015  . Seasonal allergies    Patient Active Problem List   Diagnosis Date Noted  . AKI (acute kidney injury) ([place]) 01/11/2021  . Acute on chronic diastolic  CHF (congestive heart failure) ([place]) 01/10/2021  . Acute respiratory failure ([place]) 01/06/2021  . PNA (pneumonia) 01/05/2021  . Fever 01/04/2021  . Dyslipidemia associated with type 2 diabetes mellitus ([place]) 06/27/2020  . Polyp of transverse colon   . Diverticulosis of large intestine without diverticulitis   . 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, without long-term current use of insulin ([place]) 11/22/2017  . Erectile dysfunction 09/21/2015  . Degeneration of lumbar or lumbosacral intervertebral disc 06/30/2015  . Left leg numbness 06/23/2015  . Encounter for general adult medical examination with abnormal findings 04/07/2015  . Obesity, Class I, BMI 30-34.9 06/23/2014  . Essential hypertension   . Seasonal allergies   . Irritable bowel syndrome   . GAD (generalized anxiety disorder)     Past Surgical History:  Procedure Laterality Date  . COLON SURGERY    . COLONOSCOPY  2012   mild diverticulosis, rec rpt 5 yrs 2/2 fmhx polyps  . COLONOSCOPY WITH PROPOFOL N/A 05/29/2019   TA, diverticulosis, rpt 5 yrs (Bonna Gains, Lennette Bihari, MD)  . MICRODISCECTOMY LUMBAR  07/2015   L4/5/S1 Trenton Gammon)  . TONSILLECTOMY     Family History  Problem Relation Age of Onset  . Heart disease Father        pacer and CHF  . Depression Father   . Transient ischemic attack Mother 24       several  . Depression Mother   . Colon polyps Sister   .  Sudden death Maternal Grandfather 39       ?mustard gas   Social History   Tobacco Use  . Smoking status: Never Smoker  . Smokeless tobacco: Never Used  Substance Use Topics  . Alcohol use: Yes    Alcohol/week: 0.0 standard drinks    Comment: couple of drinks per week   Allergies  Allergen Reactions  . Ace Inhibitors Cough   Current Meds  Medication Sig  . albuterol (VENTOLIN HFA) 108 (90 Base) MCG/ACT inhaler Inhale 2 puffs into the lungs every 6 (six) hours as needed for up to 14 days for wheezing or shortness of breath.  .  ALPRAZolam (NIRAVAM) 0.5 MG dissolvable tablet Take 1 tablet (0.5 mg total) by mouth daily as needed for anxiety (plane rides).  . chlorpheniramine-HYDROcodone (TUSSIONEX) 10-8 MG/5ML SUER Take 5 mLs by mouth every 12 (twelve) hours for 3 days.  . citalopram (CELEXA) 20 MG tablet Take 1 tablet (20 mg total) by mouth daily.  Marland Kitchen gabapentin (NEURONTIN) 300 MG capsule TAKE 1 CAPSULE BY MOUTH EVERYDAY AT BEDTIME  . guaiFENesin (MUCINEX) 600 MG 12 hr tablet Take 1 tablet (600 mg total) by mouth 2 (two) times daily for 14 days.  Marland Kitchen ibuprofen (ADVIL,MOTRIN) 200 MG tablet Take 200 mg by mouth as needed.  . metFORMIN (GLUCOPHAGE XR) 500 MG 24 hr tablet Take 1 tablet (500 mg total) by mouth daily.  . mometasone-formoterol (DULERA) 100-5 MCG/ACT AERO Inhale 2 puffs into the lungs 2 (two) times daily.  Marland Kitchen torsemide (DEMADEX) 20 MG tablet Take 1 tablet (20 mg total) by mouth every other day.  . traZODone (DESYREL) 50 MG tablet Take 1 tablet (50 mg total) by mouth at bedtime for 7 days.  . valsartan-hydrochlorothiazide (DIOVAN-HCT) 160-12.5 MG tablet TAKE 1 TABLET BY MOUTH EVERY DAY   Immunization History  Administered Date(s) Administered  . Influenza,inj,Quad PF,6+ Mos 08/31/2015, 08/28/2019  . Influenza-Unspecified 09/04/2014, 11/06/2017, 08/26/2020  . PFIZER(Purple Top)SARS-COV-2 Vaccination 02/05/2020, 03/04/2020, 09/08/2020  . Pneumococcal Polysaccharide-23 02/22/2018  . Td 12/05/2008  . Tdap 01/02/2019  . Zoster Recombinat (Shingrix) 06/25/2020       Objective:   Physical Exam BP 134/78 (BP Location: Left Arm, Cuff Size: Normal)   Pulse (!) 101   Temp 97.8 F (36.6 C) (Temporal)   Ht 5\' 8"  (1.727 m)   Wt 199 lb 9.6 oz (90.5 kg)   SpO2 98%   BMI 30.35 kg/m  GENERAL: Well-developed, overweight gentleman, fully ambulatory. HEAD: Normocephalic, atraumatic.  EYES: Pupils equal, round, reactive to light.  No scleral icterus.  MOUTH: Nose/mouth/throat not examined due to masking requirements for  COVID 19. NECK: Supple. No thyromegaly. Trachea midline. No JVD.  No adenopathy. PULMONARY: Good air entry bilaterally.  No adventitious sounds. CARDIOVASCULAR: S1 and S2. Regular rate and rhythm.  No rubs, murmurs or gallops heard. ABDOMEN: Protuberant otherwise benign. MUSCULOSKELETAL: No joint deformity, no clubbing, no edema.  NEUROLOGIC: No focal deficits, speech is fluent.  No gait disturbance. SKIN: Intact,warm,dry. PSYCH: Mood and behavior normal.     Assessment & Plan:     ICD-10-CM   1. Legionella pneumonia ([place])  A48.1 DG Chest 2 View    CBC w/Diff    Iron    B12    TSH    Ferritin   Appears to be resolving Completed antibiotic regimen Likely exposure during travel Follow-up 4 to 6 weeks with chest x-ray  2. Dyspnea on exertion  R06.00    Predated admission for pneumonia Has diastolic dysfunction Suspect  multifactorial Deconditioning may be playing a role  3. Diastolic dysfunction  O03.55 Ambulatory referral to Cardiology   Grade II diastolic dysfunction Postural tachycardia At risk for metabolic syndrome Referral to cardiology  4. Tachycardia  R00.0    Mostly postural May be related to deconditioning  5. Anemia, unspecified type  D64.9    Noted anemia during admission Anemia studies Recheck CBC This may add to sensation of dyspnea   Orders Placed This Encounter  Procedures  . DG Chest 2 View    Standing Status:   Future    Standing Expiration Date:   07/18/2021    Order Specific Question:   Reason for Exam (SYMPTOM  OR DIAGNOSIS REQUIRED)    Answer:   F/U PNA    Order Specific Question:   Preferred imaging location?    Answer:   [facility]  . CBC w/Diff    Standing Status:   Future    Standing Expiration Date:   01/18/2022  . Iron    Standing Status:   Future    Standing Expiration Date:   01/18/2022  . B12    Standing Status:   Future    Standing Expiration Date:   01/18/2022  . TSH    Standing Status:   Future    Standing Expiration  Date:   01/18/2022  . Ferritin    Standing Status:   Future    Standing Expiration Date:   01/18/2022  . Ambulatory referral to Cardiology    Referral Priority:   Routine    Referral Type:   Consultation    Referral Reason:   Specialty Services Required    Requested Specialty:   Cardiology    Number of Visits Requested:   1   Discussion:  The patient appears to be recovering well from his Legionella pneumonia.  We will see him in follow-up in 4 to 6 weeks time with a chest x-ray at that time.  He was noted to have some anemia during his admission and we will work this up.  He has ongoing issues with dyspnea that preceded his pneumonia, he had evidence of diastolic dysfunction on 2D echo performed during his hospitalization.  He has mild left atrial dilatation with and mild pulmonary hypertension.  These may be related to his diastolic dysfunction.  Will have cardiology evaluate.  He has issues with postural tachycardia query POTS.  He is at high risk for metabolic syndrome.   Renold Don, MD [facility]   *This note was dictated using voice recognition software/Dragon.  Despite best efforts to proofread, errors can occur which can change the meaning.  Any change was purely unintentional.

## 2021-01-18 NOTE — Patient Instructions (Signed)
We will see you in follow-up in 4 to 6 weeks time, with a chest x-ray at that time.  You appear to be doing well from the pulmonary standpoint.   We are referring you to cardiology to evaluate your issues with diastolic dysfunction and tachycardia (fast heart rate).

## 2021-01-19 ENCOUNTER — Ambulatory Visit: Payer: BC Managed Care – PPO | Admitting: Cardiology

## 2021-01-19 ENCOUNTER — Encounter: Payer: Self-pay | Admitting: Cardiology

## 2021-01-19 VITALS — BP 114/74 | HR 103 | Ht 68.0 in | Wt 200.0 lb

## 2021-01-19 DIAGNOSIS — R931 Abnormal findings on diagnostic imaging of heart and coronary circulation: Secondary | ICD-10-CM | POA: Diagnosis not present

## 2021-01-19 DIAGNOSIS — I10 Essential (primary) hypertension: Secondary | ICD-10-CM | POA: Diagnosis not present

## 2021-01-19 NOTE — Progress Notes (Signed)
Cardiology Office Note:    Date:  01/19/2021   ID:  Jermaine Harris, DOB 1961-07-31, MRN [id number]  PCP:  Ria Bush, [place]  Cardiologist:  No primary care provider on file.  Advanced Practice Provider:  No care team member to display Electrophysiologist:  None       Referring MD: Tyler Pita, MD   Chief Complaint  Patient presents with  . New Patient (Initial Visit)    Referred by Dr. Patsey Berthold for Diastolic Dysfunction  Pt states he has been getting over Pneumonia, so he has SOB on exertion. Denies other cardiac Sx.   Jermaine Harris is a 60 y.o. male who is being seen today for the evaluation of diastolic dysfunction at the request of Tyler Pita, MD.   History of Present Illness:    Jermaine Harris is a 60 y.o. male with a hx of hypertension, diabetes, who presents due to echocardiogram showing diastolic dysfunction.  Patient admitted to [facility] on 01/2021 with flulike symptoms after travel to [place].  Diagnosed with bilateral pneumonia secondary to Legionella and respiratory failure.  He was managed with antibiotics with improvement in clinical status.  Eventually discharged.  Echocardiogram obtained during admission on 01/2021 showed/was read as having normal systolic function and grade 2 diastolic dysfunction.  He has noticed shortness of breath with exertion since discharge about 5 days ago.  His shortness of breath has been improving daily.  He denies chest pain, palpitations.  Past Medical History:  Diagnosis Date  . Actinic keratosis   . Claustrophobia   . DM II (diabetes mellitus, type II), controlled ([place])   . GAD (generalized anxiety disorder)    claustrophobia, some panic attacks  . HTN (hypertension)   . Irritable bowel syndrome   . Lumbar herniated disc 06/2015  . Seasonal allergies     Past Surgical History:  Procedure Laterality Date  . COLON SURGERY    . COLONOSCOPY  2012   mild diverticulosis, rec rpt  5 yrs 2/2 fmhx polyps  . COLONOSCOPY WITH PROPOFOL N/A 05/29/2019   TA, diverticulosis, rpt 5 yrs (Bonna Gains, Lennette Bihari, MD)  . MICRODISCECTOMY LUMBAR  07/2015   L4/5/S1 Trenton Gammon)  . TONSILLECTOMY      Current Medications: Current Meds  Medication Sig  . albuterol (VENTOLIN HFA) 108 (90 Base) MCG/ACT inhaler Inhale 2 puffs into the lungs every 6 (six) hours as needed for up to 14 days for wheezing or shortness of breath.  . ALPRAZolam (NIRAVAM) 0.5 MG dissolvable tablet Take 1 tablet (0.5 mg total) by mouth daily as needed for anxiety (plane rides).  . citalopram (CELEXA) 20 MG tablet Take 1 tablet (20 mg total) by mouth daily.  Marland Kitchen gabapentin (NEURONTIN) 300 MG capsule TAKE 1 CAPSULE BY MOUTH EVERYDAY AT BEDTIME  . guaiFENesin (MUCINEX) 600 MG 12 hr tablet Take 1 tablet (600 mg total) by mouth 2 (two) times daily for 14 days.  Marland Kitchen ibuprofen (ADVIL,MOTRIN) 200 MG tablet Take 200 mg by mouth as needed.  . metFORMIN (GLUCOPHAGE XR) 500 MG 24 hr tablet Take 1 tablet (500 mg total) by mouth daily.  . mometasone-formoterol (DULERA) 100-5 MCG/ACT AERO Inhale 2 puffs into the lungs 2 (two) times daily.  Marland Kitchen torsemide (DEMADEX) 20 MG tablet Take 1 tablet (20 mg total) by mouth every other day.  . traZODone (DESYREL) 50 MG tablet Take 1 tablet (50 mg total) by mouth at bedtime for 7 days.  . valsartan-hydrochlorothiazide (DIOVAN-HCT) 160-12.5 MG tablet  TAKE 1 TABLET BY MOUTH EVERY DAY     Allergies:   Ace inhibitors   Social History   Socioeconomic History  . Marital status: Married    Spouse name: Not on file  . Number of children: Not on file  . Years of education: Not on file  . Highest education level: Not on file  Occupational History  . Not on file  Tobacco Use  . Smoking status: Never Smoker  . Smokeless tobacco: Never Used  Vaping Use  . Vaping Use: Never used  Substance and Sexual Activity  . Alcohol use: Yes    Alcohol/week: 0.0 standard drinks    Comment: couple of drinks per week   . Drug use: Never  . Sexual activity: Not on file  Other Topics Concern  . 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  ROS:   Please see the history of present illness.     All other systems reviewed and are negative.  EKGs/Labs/Other Studies Reviewed:    The following studies were reviewed today:   EKG:  EKG not ordered today.   Recent Labs: 01/11/2021: ALT 60 01/12/2021: B Natriuretic Peptide 23.8 01/13/2021: BUN 28; Creatinine, Ser 1.17; Potassium 4.6; Sodium 136 01/15/2021: Magnesium 2.4 01/18/2021: Hemoglobin 13.3; Platelets 502; TSH 2.725  Recent Lipid Panel    Component Value Date/Time   CHOL 129 06/25/2020 1247   TRIG 192.0 (H) 06/25/2020 1247   HDL 30.10 (L) 06/25/2020 1247   CHOLHDL 4 06/25/2020 1247   VLDL 38.4 06/25/2020 1247   LDLCALC 61 06/25/2020 1247   LDLDIRECT 90.0 11/16/2017 0847     Risk Assessment/Calculations:      Physical Exam:    VS:  BP 114/74   Pulse (!) 103   Ht 5\' 8"  (1.727 m)   Wt 200 lb (90.7 kg)   BMI 30.41 kg/m     Wt Readings from Last 3 Encounters:  01/19/21 200 lb (90.7 kg)  01/18/21 199 lb 9.6 oz (90.5 kg)  01/15/21 207 lb 9.6 oz (94.2 kg)     GEN:  Well nourished, well developed in no acute  distress HEENT: Normal NECK: No JVD; No carotid bruits LYMPHATICS: No lymphadenopathy CARDIAC: RRR, no murmurs, rubs, gallops RESPIRATORY:  Clear to auscultation without rales, wheezing or rhonchi  ABDOMEN: Soft, non-tender, non-distended MUSCULOSKELETAL:  No edema; No deformity  SKIN: Warm and dry NEUROLOGIC:  Alert and oriented x 3 PSYCHIATRIC:  Normal affect   ASSESSMENT:    1. Abnormal echocardiogram   2. Primary hypertension    PLAN:    In order of problems listed above:  1. Echocardiogram 01/2021 reviewed by myself, showing normal systolic function, mild MR.  Diastolic function appears indeterminate  or normal.  E:A 1.4, LA is slightly enlarged, TR velocity less than 2.8, average E: E prime less than 14.  Dyspnea on exertion which is currently improving day by day likely due to pneumonia.  Patient advised on graduated exercising with improving symptoms.  If at follow-up visit, symptoms persist, can consider repeating echocardiogram or stress testing. 2. Hypertension, BP controlled, continue current meds.  Follow-up in 6 months     Medication Adjustments/Labs and Tests Ordered: Current medicines are reviewed at length with the patient today.  Concerns regarding medicines are outlined above.  No orders of the defined types were placed in this encounter.  No orders of the defined types were placed in this encounter.   Patient Instructions  Medication Instructions:   Your physician recommends that you continue on your current medications as directed. Please refer to the Current Medication list given to you today.    *If you need a refill on your cardiac medications before your next appointment, please call your pharmacy*   Lab Work: None ordered If you have labs (blood work) drawn today and your tests are completely normal, you will receive your results only by: Marland Kitchen MyChart Message (if you have MyChart) OR . A paper copy in the mail If you have any lab test that is  abnormal or we need to change your treatment, we will call you to review the results.   Testing/Procedures: None ordered   Follow-Up: At [facility], you and your health needs are our priority.  As part of our continuing mission to provide you with exceptional heart care, we have created designated Provider Care Teams.  These Care Teams include your primary Cardiologist (physician) and Advanced Practice Providers (APPs -  Physician Assistants and Nurse Practitioners) who all work together to provide you with the care you need, when you need it.  We recommend signing up for the patient portal called "MyChart".  Sign up information is provided on this After Visit Summary.  MyChart is used to connect with patients for Virtual Visits (Telemedicine).  Patients are able to view lab/test results, encounter notes, upcoming appointments, etc.  Non-urgent messages can be sent to your provider as well.   To learn more about what you can do with MyChart, go to [email or web address].    Your next appointment:   6 month(s)  The format for your next appointment:   In Person  Provider:   Kate Sable, MD   Other Instructions      Signed, Kate Sable, MD  01/19/2021 10:47 AM    [place]

## 2021-01-19 NOTE — Patient Instructions (Signed)

## 2021-01-26 ENCOUNTER — Ambulatory Visit: Payer: BC Managed Care – PPO | Admitting: Family Medicine

## 2021-01-26 ENCOUNTER — Encounter: Payer: Self-pay | Admitting: Family Medicine

## 2021-01-26 ENCOUNTER — Other Ambulatory Visit: Payer: Self-pay

## 2021-01-26 VITALS — BP 116/60 | HR 92 | Temp 97.7°F | Ht 68.0 in | Wt 196.1 lb

## 2021-01-26 DIAGNOSIS — S90851A Superficial foreign body, right foot, initial encounter: Secondary | ICD-10-CM

## 2021-01-26 DIAGNOSIS — I10 Essential (primary) hypertension: Secondary | ICD-10-CM | POA: Diagnosis not present

## 2021-01-26 DIAGNOSIS — E669 Obesity, unspecified: Secondary | ICD-10-CM

## 2021-01-26 DIAGNOSIS — A481 Legionnaires' disease: Secondary | ICD-10-CM | POA: Diagnosis not present

## 2021-01-26 DIAGNOSIS — E1136 Type 2 diabetes mellitus with diabetic cataract: Secondary | ICD-10-CM

## 2021-01-26 DIAGNOSIS — I5030 Unspecified diastolic (congestive) heart failure: Secondary | ICD-10-CM

## 2021-01-26 NOTE — Progress Notes (Signed)
Patient ID: Jermaine Harris, male    DOB: [birth year], 60 y.o.   MRN: [id number]  This visit was conducted in person.  BP 116/60   Pulse 92   Temp 97.7 F (36.5 C) (Temporal)   Ht 5\' 8"  (1.727 m)   Wt 196 lb 2 oz (89 kg)   SpO2 96%   BMI 29.82 kg/m    CC: hosp f/u visit  Subjective:   HPI: Jermaine Harris is a 60 y.o. male presenting on 01/26/2021 for Hospitalization Follow-up (Admitted on 01/05/21 to [facility], dx SOB. )   Recent hospitalization for legionella pneumonia after trip to [place] complicated by hypoxia and AKI. S/p ID and pulm consult, treated with levaquin antibiotic 10d course. Also given IV lasix for elevate dBNP to 482. Echocardiogram showed normal EF (55-60%) with G2DD and RV pressure of 36 as well as mild-mod MVR. Started on torsemide 20mg  QOD, referred to heart failure clinic, rec outpatient sleep study to evaluate for OSA.   Since home, he's seen pulmonology - story not consistent with OSA. Planned return to pulm for f/u CXR in 4-6 wks. ?POTS. Saw cardiology - overall improving post pneumonia - rec 6 mo f/u visit.   Overall feeling better since home.  Notes ongoing dyspnea and tachycardia with exertion but this is improving.  Notes ~20 lb weight loss since illness. Remains with low appetite.  DM - not checking sugars.   By the way - requests splinter removed from R sole - stepped on wooden splinter while walking in deck yesterday, was unable to remove at home.    Admit date: 01/05/2021 Discharge date: 01/15/2021 TCM hosp f/u phone call not completed.   Admission Diagnoses:  Discharge Diagnoses:  Active Problems:   Essential hypertension   PNA (pneumonia)   Acute respiratory failure (HCC)   Acute on chronic diastolic CHF (congestive heart failure) ([place])   AKI (acute kidney injury) ([place])  Discharged Condition: good     Relevant past medical, surgical, family and social history reviewed and updated as indicated. Interim medical history since our last visit  reviewed. Allergies and medications reviewed and updated. Outpatient Medications Prior to Visit  Medication Sig Dispense Refill  . ALPRAZolam (NIRAVAM) 0.5 MG dissolvable tablet Take 1 tablet (0.5 mg total) by mouth daily as needed for anxiety (plane rides). 20 tablet 0  . citalopram (CELEXA) 20 MG tablet Take 1 tablet (20 mg total) by mouth daily. 90 tablet 3  . gabapentin (NEURONTIN) 300 MG capsule TAKE 1 CAPSULE BY MOUTH EVERYDAY AT BEDTIME 90 capsule 3  . ibuprofen (ADVIL,MOTRIN) 200 MG tablet Take 200 mg by mouth as needed.    . metFORMIN (GLUCOPHAGE XR) 500 MG 24 hr tablet Take 1 tablet (500 mg total) by mouth daily. 30 tablet 3  . mometasone-formoterol (DULERA) 100-5 MCG/ACT AERO Inhale 2 puffs into the lungs 2 (two) times daily. 1 each 0  . valsartan-hydrochlorothiazide (DIOVAN-HCT) 160-12.5 MG tablet TAKE 1 TABLET BY MOUTH EVERY DAY 90 tablet 1  . torsemide (DEMADEX) 20 MG tablet Take 1 tablet (20 mg total) by mouth every other day. 20 tablet 0  . albuterol (VENTOLIN HFA) 108 (90 Base) MCG/ACT inhaler Inhale 2 puffs into the lungs every 6 (six) hours as needed for up to 14 days for wheezing or shortness of breath. (Patient not taking: Reported on 01/26/2021) 6.7 g 0  . torsemide (DEMADEX) 20 MG tablet Take 1 tablet (20 mg total) by mouth daily as needed (swelling, elevated blood pressure).    Marland Kitchen  guaiFENesin (MUCINEX) 600 MG 12 hr tablet Take 1 tablet (600 mg total) by mouth 2 (two) times daily for 14 days. 28 tablet 0  . traZODone (DESYREL) 50 MG tablet Take 1 tablet (50 mg total) by mouth at bedtime for 7 days. 7 tablet 0   No facility-administered medications prior to visit.     Per HPI unless specifically indicated in ROS section below Review of Systems Objective:  BP 116/60   Pulse 92   Temp 97.7 F (36.5 C) (Temporal)   Ht 5\' 8"  (1.727 m)   Wt 196 lb 2 oz (89 kg)   SpO2 96%   BMI 29.82 kg/m   Wt Readings from Last 3 Encounters:  01/26/21 196 lb 2 oz (89 kg)  01/19/21 200  lb (90.7 kg)  01/18/21 199 lb 9.6 oz (90.5 kg)      Physical Exam Vitals and nursing note reviewed.  Constitutional:      Appearance: Normal appearance. He is not ill-appearing.  HENT:     Head: Normocephalic and atraumatic.  Eyes:     Extraocular Movements: Extraocular movements intact.     Conjunctiva/sclera: Conjunctivae normal.     Pupils: Pupils are equal, round, and reactive to light.  Cardiovascular:     Rate and Rhythm: Normal rate and regular rhythm.     Pulses: Normal pulses.     Heart sounds: Normal heart sounds. No murmur heard.   Pulmonary:     Effort: Pulmonary effort is normal. No respiratory distress.     Breath sounds: Normal breath sounds. No wheezing, rhonchi or rales.  Musculoskeletal:        General: Normal range of motion.     Cervical back: Normal range of motion and neck supple. No rigidity.     Right lower leg: No edema.     Left lower leg: No edema.  Lymphadenopathy:     Cervical: No cervical adenopathy.  Skin:    General: Skin is warm and dry.     Findings: No rash.     Comments: Splinter to medial R sole without surrounding erythema  Neurological:     Mental Status: He is alert.  Psychiatric:        Mood and Affect: Mood normal.        Behavior: Behavior normal.    Splinter removal: Informed consent obtained.  Area cleaned with alcohol pad  Using 22g needle, initial entry wound elongated along splinter body to reveal entire splinter which was grasped with forceps and removed. Wound cleaned with alcohol pad. Pt tolerated procedure well.  Aftercare instructions reviewed.     Results for orders placed or performed during the hospital encounter of 01/18/21  CBC w/Diff  Result Value Ref Range   WBC 7.9 4.0 - 10.5 K/uL   RBC 4.80 4.22 - 5.81 MIL/uL   Hemoglobin 13.3 13.0 - 17.0 g/dL   HCT 40.6 39.0 - 52.0 %   MCV 84.6 80.0 - 100.0 fL   MCH 27.7 26.0 - 34.0 pg   MCHC 32.8 30.0 - 36.0 g/dL   RDW 13.4 11.5 - 15.5 %   Platelets 502 (H) 150 -  400 K/uL   nRBC 0.0 0.0 - 0.2 %   Neutrophils Relative % 57 %   Neutro Abs 4.6 1.7 - 7.7 K/uL   Lymphocytes Relative 26 %   Lymphs Abs 2.1 0.7 - 4.0 K/uL   Monocytes Relative 10 %   Monocytes Absolute 0.8 0.1 - 1.0 K/uL   Eosinophils  Relative 4 %   Eosinophils Absolute 0.3 0.0 - 0.5 K/uL   Basophils Relative 2 %   Basophils Absolute 0.1 0.0 - 0.1 K/uL   Immature Granulocytes 1 %   Abs Immature Granulocytes 0.07 0.00 - 0.07 K/uL  Iron  Result Value Ref Range   Iron 47 45 - 182 ug/dL  B12  Result Value Ref Range   Vitamin B-12 966 (H) 180 - 914 pg/mL  TSH  Result Value Ref Range   TSH 2.725 0.350 - 4.500 uIU/mL  Ferritin  Result Value Ref Range   Ferritin 237 24 - 336 ng/mL   Lab Results  Component Value Date   HGBA1C 7.8 (H) 01/05/2021   Assessment & Plan:  This visit occurred during the SARS-CoV-2 public health emergency.  Safety protocols were in place, including screening questions prior to the visit, additional usage of staff PPE, and extensive cleaning of exam room while observing appropriate contact time as indicated for disinfecting solutions.   Problem List Items Addressed This Visit    Splinter of foot without infection, right, initial encounter    Splinter easily removed, pt tolerated well, after care instructions provided as well as red flags to seek care.       Obesity, Class I, BMI 30-34.9    Reviewed weight loss noted after recent pneumonia illness.       Legionella pneumonia ([place])    Recent prolonged hospitalization for this, presented with hypoxic resp failure, now largely resolved.  Completed 10d levaquin course.  No further need for O2.  Has seen pulm with planned rpt CXR in 4-6 wks.  Currently completing dulera Rx prescribed by hospital.       Essential hypertension    BP stable on current regimen.  He desires to discontinue torsemide - rec change to PRN leg swelling.  Reassess at 2wk f/u visit.       Relevant Medications   torsemide  (DEMADEX) 20 MG tablet   Diastolic CHF ([place])    Started on torsemide, overall euvolemic to slightly hypovolemic - will change torsemide to PRN.  Saw cardiology with f/u planned in 6 months.  Ongoing exertional dyspnea (improving) thought related to PNA.       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  rec buy glucometer and start monitoring fasting sugars to determine need to increase metformin dose.  Reassess at 2 wk f/u visit, check fructosamine at that time along with BMP.           No orders of the defined types were placed in this encounter.  No orders of the defined types were placed in this encounter.   Patient Instructions  Check on preferred glucometer brand.  Pick up glucose meter and start checking at home fasting a few times a week - if fasting sugars consistently >140, go ahead and start 2nd metformin XR at night and let me know.  Change torsemide to 20mg  as needed for swelling or elevated blood pressures.  We will recheck labs at 2 week follow up visit.  Splinter removed from sole of right foot   Follow up plan: Return if symptoms worsen or fail to improve.  Ria Bush, MD

## 2021-01-26 NOTE — Assessment & Plan Note (Signed)
Reviewed weight loss noted after recent pneumonia illness.

## 2021-01-26 NOTE — Assessment & Plan Note (Signed)
Tolerating XR metformin better.  rec buy glucometer and start monitoring fasting sugars to determine need to increase metformin dose.  Reassess at 2 wk f/u visit, check fructosamine at that time along with BMP.

## 2021-01-26 NOTE — Assessment & Plan Note (Addendum)
Started on torsemide, overall euvolemic to slightly hypovolemic - will change torsemide to PRN.  Saw cardiology with f/u planned in 6 months.  Ongoing exertional dyspnea (improving) thought related to PNA.

## 2021-01-26 NOTE — Assessment & Plan Note (Signed)
Recent prolonged hospitalization for this, presented with hypoxic resp failure, now largely resolved.  Completed 10d levaquin course.  No further need for O2.  Has seen pulm with planned rpt CXR in 4-6 wks.  Currently completing dulera Rx prescribed by hospital.

## 2021-01-26 NOTE — Assessment & Plan Note (Signed)
Splinter easily removed, pt tolerated well, after care instructions provided as well as red flags to seek care.

## 2021-01-26 NOTE — Patient Instructions (Addendum)
Check on preferred glucometer brand.  Pick up glucose meter and start checking at home fasting a few times a week - if fasting sugars consistently >140, go ahead and start 2nd metformin XR at night and let me know.  Change torsemide to 20mg  as needed for swelling or elevated blood pressures.  We will recheck labs at 2 week follow up visit.  Splinter removed from sole of right foot

## 2021-01-26 NOTE — Assessment & Plan Note (Signed)
BP stable on current regimen.  He desires to discontinue torsemide - rec change to PRN leg swelling.  Reassess at 2wk f/u visit.

## 2021-01-27 ENCOUNTER — Other Ambulatory Visit: Payer: Self-pay | Admitting: Family Medicine

## 2021-01-27 NOTE — Telephone Encounter (Signed)
Gabapentin Last filled:  12/16/19, #90 Last OV:  01/26/21, Legionella pneumonia Next OV:  02/09/21, 4 mo f/u

## 2021-02-03 DIAGNOSIS — G43109 Migraine with aura, not intractable, without status migrainosus: Secondary | ICD-10-CM | POA: Diagnosis not present

## 2021-02-04 ENCOUNTER — Ambulatory Visit: Payer: BC Managed Care – PPO | Admitting: Family

## 2021-02-09 ENCOUNTER — Other Ambulatory Visit: Payer: Self-pay

## 2021-02-09 ENCOUNTER — Encounter: Payer: Self-pay | Admitting: Family Medicine

## 2021-02-09 ENCOUNTER — Ambulatory Visit: Payer: BC Managed Care – PPO | Admitting: Family Medicine

## 2021-02-09 VITALS — BP 114/62 | HR 75 | Temp 97.7°F | Ht 68.0 in | Wt 198.0 lb

## 2021-02-09 DIAGNOSIS — E1136 Type 2 diabetes mellitus with diabetic cataract: Secondary | ICD-10-CM | POA: Diagnosis not present

## 2021-02-09 DIAGNOSIS — Z23 Encounter for immunization: Secondary | ICD-10-CM | POA: Diagnosis not present

## 2021-02-09 DIAGNOSIS — I10 Essential (primary) hypertension: Secondary | ICD-10-CM

## 2021-02-09 DIAGNOSIS — E669 Obesity, unspecified: Secondary | ICD-10-CM | POA: Diagnosis not present

## 2021-02-09 LAB — BASIC METABOLIC PANEL
BUN: 28 mg/dL — ABNORMAL HIGH (ref 6–23)
CO2: 28 mEq/L (ref 19–32)
Calcium: 9.4 mg/dL (ref 8.4–10.5)
Chloride: 103 mEq/L (ref 96–112)
Creatinine, Ser: 1.27 mg/dL (ref 0.40–1.50)
GFR: 61.25 mL/min (ref >60.00)
Glucose, Bld: 112 mg/dL — ABNORMAL HIGH (ref 70–99)
Potassium: 4.1 mEq/L (ref 3.5–5.1)
Sodium: 138 mEq/L (ref 135–145)

## 2021-02-09 NOTE — Assessment & Plan Note (Signed)
Chronic, stable. Continue current regimen. 

## 2021-02-09 NOTE — Progress Notes (Signed)
Patient ID: Jermaine Harris, male    DOB: 16-Aug-1961, 60 y.o.   MRN: [id number]  This visit was conducted in person.  BP 114/62   Pulse 75   Temp 97.7 F (36.5 C) (Temporal)   Ht 5\' 8"  (1.727 m)   Wt 198 lb (89.8 kg)   SpO2 95%   BMI 30.11 kg/m    CC: 4 mo f/u visit  Subjective:   HPI: Jermaine Harris is a 60 y.o. male presenting on 02/09/2021 for Diabetes (Here for 4 mo f/u.)   See prior note for details.  Recent hospitalization for legionella pneumonia after trip to [place]. Symptoms have fully resolved.   Obesity - has changed diet since hospitalization. Cutting out carbs, fast food, sweet beverages, alcohol. Only drinks diet sodas. Continues walking regularly - dogs as well as walking 9 holes 2x/wk.   DM - does regularly check sugars and brings log - 100-130s fasting. Compliant with antihyperglycemic regimen which includes: metformin SR 500mg  bid. Denies low sugars or hypoglycemic symptoms. Denies paresthesias. Last diabetic eye exam DUE. Pneumovax: 02/2018. Prevnar: not due. Glucometer brand: [facility] brand [employer]rix[employer]. DSME: declined.  Lab Results  Component Value Date   HGBA1C 7.8 (H) 01/05/2021   Diabetic Foot Exam - Simple   No data filed    Lab Results  Component Value Date   MICROALBUR 1.1 03/31/2015        Relevant past medical, surgical, family and social history reviewed and updated as indicated. Interim medical history since our last visit reviewed. Allergies and medications reviewed and updated. Outpatient Medications Prior to Visit  Medication Sig Dispense Refill  . ALPRAZolam (NIRAVAM) 0.5 MG dissolvable tablet Take 1 tablet (0.5 mg total) by mouth daily as needed for anxiety (plane rides). 20 tablet 0  . citalopram (CELEXA) 20 MG tablet Take 1 tablet (20 mg total) by mouth daily. 90 tablet 3  . gabapentin (NEURONTIN) 300 MG capsule TAKE 1 CAPSULE BY MOUTH EVERYDAY AT BEDTIME 90 capsule 3  . ibuprofen (ADVIL,MOTRIN) 200 MG tablet Take 200 mg by mouth as  needed.    . metFORMIN (GLUCOPHAGE XR) 500 MG 24 hr tablet Take 1 tablet (500 mg total) by mouth daily. 30 tablet 3  . mometasone-formoterol (DULERA) 100-5 MCG/ACT AERO Inhale 2 puffs into the lungs 2 (two) times daily. 1 each 0  . torsemide (DEMADEX) 20 MG tablet Take 1 tablet (20 mg total) by mouth daily as needed (swelling, elevated blood pressure).    . valsartan-hydrochlorothiazide (DIOVAN-HCT) 160-12.5 MG tablet TAKE 1 TABLET BY MOUTH EVERY DAY 90 tablet 1  . albuterol (VENTOLIN HFA) 108 (90 Base) MCG/ACT inhaler Inhale 2 puffs into the lungs every 6 (six) hours as needed for up to 14 days for wheezing or shortness of breath. (Patient not taking: No sig reported) 6.7 g 0   No facility-administered medications prior to visit.     Per HPI unless specifically indicated in ROS section below Review of Systems Objective:  BP 114/62   Pulse 75   Temp 97.7 F (36.5 C) (Temporal)   Ht 5\' 8"  (1.727 m)   Wt 198 lb (89.8 kg)   SpO2 95%   BMI 30.11 kg/m   Wt Readings from Last 3 Encounters:  02/09/21 198 lb (89.8 kg)  01/26/21 196 lb 2 oz (89 kg)  01/19/21 200 lb (90.7 kg)      Physical Exam Vitals and nursing note reviewed.  Constitutional:      Appearance: Normal appearance. He is not  ill-appearing.  Cardiovascular:     Rate and Rhythm: Normal rate and regular rhythm.     Pulses: Normal pulses.     Heart sounds: Normal heart sounds. No murmur heard.   Pulmonary:     Effort: Pulmonary effort is normal. No respiratory distress.     Breath sounds: Normal breath sounds. No wheezing, rhonchi or rales.  Musculoskeletal:        General: Normal range of motion.     Right lower leg: No edema.     Left lower leg: No edema.  Skin:    Findings: No rash.  Neurological:     Mental Status: He is alert.  Psychiatric:        Mood and Affect: Mood normal.        Behavior: Behavior normal.       Lab Results  Component Value Date   CREATININE 1.17 01/13/2021   BUN 28 (H) 01/13/2021    NA 136 01/13/2021   K 4.6 01/13/2021   CL 103 01/13/2021   CO2 23 01/13/2021   Assessment & Plan:  This visit occurred during the SARS-CoV-2 public health emergency.  Safety protocols were in place, including screening questions prior to the visit, additional usage of staff PPE, and extensive cleaning of exam room while observing appropriate contact time as indicated for disinfecting solutions.   Problem List Items Addressed This Visit    Obesity, Class I, BMI 30-34.9    Reviewed healthy diet and lifestyle choices to affect sustainable weight loss. He is interested in additional medication for weight loss. Discussed GLP1 RA.       Essential hypertension    Chronic, stable. Continue current regimen.       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 Update BMP and fructosamine. Discussed addition of GLP1 RA weekly as he's interested in further weight loss. No fmhx thyroid cancer.       Relevant Orders   Basic metabolic panel   Fructosamine    Other Visit Diagnoses    Need for shingles vaccine       Relevant Orders   Varicella-zoster vaccine IM (Completed)       No orders of the defined types were placed in this encounter.  Orders Placed This Encounter  Procedures  . Varicella-zoster vaccine IM  . Basic metabolic panel  . Fructosamine    Follow up plan: Return if symptoms worsen or fail to improve.  Ria Bush, MD

## 2021-02-09 NOTE — Assessment & Plan Note (Signed)
Reviewed healthy diet and lifestyle choices to affect sustainable weight loss. He is interested in additional medication for weight loss. Discussed GLP1 RA.

## 2021-02-09 NOTE — Patient Instructions (Addendum)
Second shingrix shot today.  Labs today to check sugar again. If above goal, we will start ozempic.  Continue metformin XR daily.  Schedule eye appointment.  Goal fasting readings 80-120, goal 2 hours after eating <180. Too low is <70.

## 2021-02-09 NOTE — Assessment & Plan Note (Signed)
Chronic, overall stable readings. Update BMP and fructosamine. Discussed addition of GLP1 RA weekly as he's interested in further weight loss. No fmhx thyroid cancer.

## 2021-02-09 NOTE — Telephone Encounter (Signed)
Seen this morning.

## 2021-02-09 NOTE — Telephone Encounter (Signed)
Patient forgot to mention to provider that he is needing a refill on medication   gabapentin (NEURONTIN) 300 MG capsule  [id number] [address], [place]

## 2021-02-10 NOTE — Telephone Encounter (Signed)
This was sent in last month, enough for a year

## 2021-02-12 LAB — FRUCTOSAMINE: Fructosamine: 256 umol/L (ref 205–285)

## 2021-02-15 ENCOUNTER — Encounter: Payer: Self-pay | Admitting: Family Medicine

## 2021-02-16 LAB — HM DIABETES EYE EXAM

## 2021-02-18 MED ORDER — ALPRAZOLAM 0.5 MG PO TBDP
0.5000 mg | ORAL_TABLET | Freq: Every day | ORAL | 0 refills | Status: DC | PRN
Start: 2021-02-18 — End: 2021-07-26

## 2021-02-22 ENCOUNTER — Other Ambulatory Visit: Payer: Self-pay | Admitting: Family Medicine

## 2021-02-24 ENCOUNTER — Ambulatory Visit
Admission: RE | Admit: 2021-02-24 | Discharge: 2021-02-24 | Disposition: A | Discharge status: Home / Self Care | Payer: BC Managed Care – PPO | Source: Ambulatory Visit | Attending: Pulmonary Disease | Admitting: Pulmonary Disease

## 2021-02-24 ENCOUNTER — Ambulatory Visit
Admission: RE | Admit: 2021-02-24 | Discharge: 2021-02-24 | Disposition: A | Discharge status: Home / Self Care | Payer: BC Managed Care – PPO | Attending: Pulmonary Disease | Admitting: Pulmonary Disease

## 2021-02-24 ENCOUNTER — Other Ambulatory Visit: Payer: Self-pay

## 2021-02-24 ENCOUNTER — Ambulatory Visit (INDEPENDENT_AMBULATORY_CARE_PROVIDER_SITE_OTHER): Payer: BC Managed Care – PPO | Admitting: Pulmonary Disease

## 2021-02-24 ENCOUNTER — Encounter: Payer: Self-pay | Admitting: Pulmonary Disease

## 2021-02-24 VITALS — BP 122/74 | HR 68 | Temp 97.1°F | Ht 68.0 in | Wt 198.0 lb

## 2021-02-24 DIAGNOSIS — A481 Legionnaires' disease: Secondary | ICD-10-CM

## 2021-02-24 DIAGNOSIS — R0600 Dyspnea, unspecified: Secondary | ICD-10-CM | POA: Diagnosis not present

## 2021-02-24 DIAGNOSIS — J189 Pneumonia, unspecified organism: Secondary | ICD-10-CM | POA: Diagnosis not present

## 2021-02-24 DIAGNOSIS — R0609 Other forms of dyspnea: Secondary | ICD-10-CM

## 2021-02-24 NOTE — Patient Instructions (Signed)
Your x-ray looks great today.  We will see you in follow-up on an as-needed basis.

## 2021-02-24 NOTE — Progress Notes (Signed)
Subjective:    Patient ID: Jermaine Harris, male    DOB: July 06, 1961, 60 y.o.   MRN: [id number]  HPI Patient is 60 year old lifelong never smoker who follows here for the issue of recent bout of Legionella pneumonia.  Since his visit on 14 February he has noted that his dyspnea has now resolved.  He had a chest x-ray today shows complete clearing of his previously noted infiltrates.  He has not had any issues with chest pain.  No cough, fevers, chills or sweats.  No complaints voiced.   Review of Systems A 10 point review of systems was performed and it is as noted above otherwise negative.  Patient Active Problem List   Diagnosis Date Noted  . Diastolic CHF ([place]) 78/93/8101  . Legionella pneumonia ([place]) 01/04/2021  . Dyslipidemia associated with type 2 diabetes mellitus ([place]) 06/27/2020  . Polyp of transverse colon   . Diverticulosis of large intestine without diverticulitis   . 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 Erectile dysfunction 09/21/2015  . Degeneration of lumbar or lumbosacral intervertebral disc 06/30/2015  . Left leg numbness 06/23/2015  . Encounter for general adult medical examination with abnormal findings 04/07/2015  . Obesity, Class I, BMI 30-34.9 06/23/2014  . Essential hypertension   . Seasonal allergies   . Irritable bowel syndrome   . GAD (generalized anxiety disorder)    Allergies  Allergen Reactions  . Ace Inhibitors Cough   Current Meds  Medication Sig  . ALPRAZolam (NIRAVAM) 0.5 MG dissolvable tablet Take 1 tablet (0.5 mg total) by mouth daily as needed for anxiety (plane rides).  . citalopram (CELEXA) 20 MG tablet Take 1 tablet (20 mg total) by mouth daily.  Marland Kitchen gabapentin (NEURONTIN) 300 MG capsule TAKE 1 CAPSULE BY MOUTH EVERYDAY AT BEDTIME  . ibuprofen (ADVIL,MOTRIN) 200 MG tablet Take 200 mg by mouth as needed.  . metFORMIN (GLUCOPHAGE-XR) 500 MG 24 hr tablet  TAKE 1 TABLET BY MOUTH EVERY DAY  . torsemide (DEMADEX) 20 MG tablet Take 1 tablet (20 mg total) by mouth daily as needed (swelling, elevated blood pressure).  . valsartan-hydrochlorothiazide (DIOVAN-HCT) 160-12.5 MG tablet TAKE 1 TABLET BY MOUTH EVERY DAY   Immunization History  Administered Date(s) Administered  . Influenza,inj,Quad PF,6+ Mos 08/31/2015, 08/28/2019  . Influenza-Unspecified 09/04/2014, 11/06/2017, 08/26/2020  . PFIZER(Purple Top)SARS-COV-2 Vaccination 02/05/2020, 03/04/2020, 09/08/2020  . Pneumococcal Polysaccharide-23 02/22/2018  . Td 12/05/2008  . Tdap 01/02/2019  . Zoster Recombinat (Shingrix) 06/25/2020, 02/09/2021      Objective:   Physical Exam BP 122/74 (BP Location: Left Arm, Cuff Size: Normal)   Pulse 68   Temp (!) 97.1 F (36.2 C) (Temporal)   Ht 5\' 8"  (1.727 m)   Wt 198 lb (89.8 kg)   SpO2 98%   BMI 30.11 kg/m  GENERAL: Well-developed, overweight gentleman, fully ambulatory.  No respiratory distress. HEAD: Normocephalic, atraumatic.  EYES: Pupils equal, round, reactive to light.  No scleral icterus.  MOUTH: Nose/mouth/throat not examined due to masking requirements for COVID 19. NECK: Supple. No thyromegaly. Trachea midline. No JVD.  No adenopathy. PULMONARY: Good air entry bilaterally.  No adventitious sounds. CARDIOVASCULAR: S1 and S2. Regular rate and rhythm.  No rubs, murmurs or gallops heard. ABDOMEN: Protuberant otherwise benign. MUSCULOSKELETAL: No joint deformity, no clubbing, no edema.  NEUROLOGIC: No focal deficits, speech is fluent.  No gait disturbance. SKIN: Intact,warm,dry. PSYCH: Mood and behavior normal.   This x-ray performed today, resolution of  previously noted infiltrates.      Assessment & Plan:     ICD-10-CM   1. Legionella pneumonia ([place]) - resolved  A48.1    Resolved without sequela  2. Dyspnea on exertion  R06.00    Completely resolved   Discussion:  Patient has resolved issues with acute respiratory failure  with hypoxia due to Legionella pneumonia without any sequela.  We will see him in follow-up on an as-needed basis.  Renold Don, MD [facility]   *This note was dictated using voice recognition software/Dragon.  Despite best efforts to proofread, errors can occur which can change the meaning.  Any change was purely unintentional.

## 2021-02-26 ENCOUNTER — Encounter: Payer: Self-pay | Admitting: Family Medicine

## 2021-04-06 ENCOUNTER — Other Ambulatory Visit: Payer: Self-pay

## 2021-04-06 ENCOUNTER — Telehealth: Payer: BC Managed Care – PPO | Admitting: Medical

## 2021-04-06 DIAGNOSIS — U071 COVID-19: Secondary | ICD-10-CM

## 2021-04-06 NOTE — Patient Instructions (Signed)
Cough, Adult A cough helps to clear your throat and lungs. A cough may be a sign of an illness or another medical condition. An acute cough may only last 2-3 weeks, while a chronic cough may last 8 or more weeks. Many things can cause a cough. They include:  Germs (viruses or bacteria) that attack the airway.  Breathing in things that bother (irritate) your lungs.  Allergies.  Asthma.  Mucus that runs down the back of your throat (postnasal drip).  Smoking.  Acid backing up from the stomach into the tube that moves food from the mouth to the stomach (gastroesophageal reflux).  Some medicines.  Lung problems.  Other medical conditions, such as heart failure or a blood clot in the lung (pulmonary embolism). Follow these instructions at home: Medicines  Take over-the-counter and prescription medicines only as told by your doctor.  Talk with your doctor before you take medicines that stop a cough (cough suppressants). Lifestyle  Do not smoke, and try not to be around smoke. Do not use any products that contain nicotine or tobacco, such as cigarettes, e-cigarettes, and chewing tobacco. If you need help quitting, ask your doctor.  Drink enough fluid to keep your pee (urine) pale yellow.  Avoid caffeine.  Do not drink alcohol if your doctor tells you not to drink.   General instructions  Watch for any changes in your cough. Tell your doctor about them.  Always cover your mouth when you cough.  Stay away from things that make you cough, such as perfume, candles, campfire smoke, or cleaning products.  If the air is dry, use a cool mist vaporizer or humidifier in your home.  If your cough is worse at night, try using extra pillows to raise your head up higher while you sleep.  Rest as needed.  Keep all follow-up visits as told by your doctor. This is important.   Contact a doctor if:  You have new symptoms.  You cough up pus.  Your cough does not get better after 2-3  weeks, or your cough gets worse.  Cough medicine does not help your cough and you are not sleeping well.  You have pain that gets worse or pain that is not helped with medicine.  You have a fever.  You are losing weight and you do not know why.  You have night sweats. Get help right away if:  You cough up blood.  You have trouble breathing.  Your heartbeat is very fast. These symptoms may be an emergency. Do not wait to see if the symptoms will go away. Get medical help right away. Call your local emergency services (911 in the [place] Do not drive yourself to the hospital. Summary  A cough helps to clear your throat and lungs. Many things can cause a cough.  Take over-the-counter and prescription medicines only as told by your doctor.  Always cover your mouth when you cough.  Contact a doctor if you have new symptoms or you have a cough that does not get better or gets worse. This information is not intended to replace advice given to you by your health care provider. Make sure you discuss any questions you have with your health care provider. Document Revised: 01/10/2020 Document Reviewed: 12/10/2018 Elsevier Patient Education  2021 [place]: What to Do if You Are Sick If you have a fever, cough or other symptoms, you might have COVID-19. Most people have mild illness and are able to recover at home. If  you are sick:  Keep track of your symptoms.  If you have an emergency warning sign (including trouble breathing), call 911. Steps to help prevent the spread of COVID-19 if you are sick If you are sick with COVID-19 or think you might have COVID-19, follow the steps below to care for yourself and to help protect other people in your home and community. Stay home except to get medical care  Stay home. Most people with COVID-19 have mild illness and can recover at home without medical care. Do not leave your home, except to get medical care. Do not visit public  areas.  Take care of yourself. Get rest and stay hydrated. Take over-the-counter medicines, such as acetaminophen, to help you feel better.  Stay in touch with your doctor. Call before you get medical care. Be sure to get care if you have trouble breathing, or have any other emergency warning signs, or if you think it is an emergency.  Avoid public transportation, ride-sharing, or taxis. Separate yourself from other people As much as possible, stay in a specific room and away from other people and pets in your home. If possible, you should use a separate bathroom. If you need to be around other people or animals in or outside of the home, wear a mask. Tell your close contactsthat they may have been exposed to COVID-19. An infected person can spread COVID-19 starting 48 hours (or 2 days) before the person has any symptoms or tests positive. By letting your close contacts know they may have been exposed to COVID-19, you are helping to protect everyone.  Additional guidance is available for those living in close quarters and shared housing.  See COVID-19 and Animals if you have questions about pets.  If you are diagnosed with COVID-19, someone from the health department may call you. Answer the call to slow the spread. Monitor your symptoms  Symptoms of COVID-19 include fever, cough, or other symptoms.  Follow care instructions from your healthcare provider and local health department. Your local health authorities may give instructions on checking your symptoms and reporting information. When to seek emergency medical attention Look for emergency warning signs* for COVID-19. If someone is showing any of these signs, seek emergency medical care immediately:  Trouble breathing  Persistent pain or pressure in the chest  New confusion  Inability to wake or stay awake  Pale, gray, or blue-colored skin, lips, or nail beds, depending on skin tone *This list is not all possible symptoms.  Please call your medical provider for any other symptoms that are severe or concerning to you. Call 911 or call ahead to your local emergency facility: Notify the operator that you are seeking care for someone who has or may have COVID-19. Call ahead before visiting your doctor  Call ahead. Many medical visits for routine care are being postponed or done by phone or telemedicine.  If you have a medical appointment that cannot be postponed, call your doctor's office, and tell them you have or may have COVID-19. This will help the office protect themselves and other patients. Get  tested  If you have symptoms of COVID-19, get tested. While waiting for test results, you stay away from others, including staying apart from those living in your household.  You can visit your state, tribal, local, and territorialhealth department's website to look for the latest local information on testing sites. If you are sick, wear a mask over your nose and mouth  You should wear a mask  over your nose and mouth if you must be around other people or animals, including pets (even at home).  You don't need to wear the mask if you are alone. If you can't put on a mask (because of trouble breathing, for example), cover your coughs and sneezes in some other way. Try to stay at least 6 feet away from other people. This will help protect the people around you.  Masks should not be placed on young children under age 45 years, anyone who has trouble breathing, or anyone who is not able to remove the mask without help. Note: During the COVID-19 pandemic, medical grade facemasks are reserved for healthcare workers and some first responders. Cover your coughs and sneezes  Cover your mouth and nose with a tissue when you cough or sneeze.  Throw away used tissues in a lined trash can.  Immediately wash your hands with soap and water for at least 20 seconds. If soap and water are not available, clean your hands with an  alcohol-based hand sanitizer that contains at least 60% alcohol. Clean your hands often  Wash your hands often with soap and water for at least 20 seconds. This is especially important after blowing your nose, coughing, or sneezing; going to the bathroom; and before eating or preparing food.  Use hand sanitizer if soap and water are not available. Use an alcohol-based hand sanitizer with at least 60% alcohol, covering all surfaces of your hands and rubbing them together until they feel dry.  Soap and water are the best option, especially if hands are visibly dirty.  Avoid touching your eyes, nose, and mouth with unwashed hands.  Handwashing Tips Avoid sharing personal household items  Do not share dishes, drinking glasses, cups, eating utensils, towels, or bedding with other people in your home.  Wash these items thoroughly after using them with soap and water or put in the dishwasher. Clean all "high-touch" surfaces everyday  Clean and disinfect high-touch surfaces in your "sick room" and bathroom; wear disposable gloves. Let someone else clean and disinfect surfaces in common areas, but you should clean your bedroom and bathroom, if possible.  If a caregiver or other person needs to clean and disinfect a sick person's bedroom or bathroom, they should do so on an as-needed basis. The caregiver/other person should wear a mask and disposable gloves prior to cleaning. They should wait as long as possible after the person who is sick has used the bathroom before coming in to clean and use the bathroom. ? High-touch surfaces include phones, remote controls, counters, tabletops, doorknobs, bathroom fixtures, toilets, keyboards, tablets, and bedside tables.  Clean and disinfect areas that may have blood, stool, or body fluids on them.  Use household cleaners and disinfectants. Clean the area or item with soap and water or another detergent if it is dirty. Then, use a household disinfectant. ? Be  sure to follow the instructions on the label to ensure safe and effective use of the product. Many products recommend keeping the surface wet for several minutes to ensure germs are killed. Many also recommend precautions such as wearing gloves and making sure you have good ventilation during use of the product. ? Use a product from [employer] List N: Disinfectants for Coronavirus (WUJWJ-19). ? Complete Disinfection Guidance When you can be around others after being sick with COVID-19 Deciding when you can be around others is different for different situations. Find out when you can safely end home isolation. For any additional questions about  your care, contact your healthcare provider or state or local health department. 02/19/2020 Content source: [facility] ([facility]), Division of Viral Diseases This information is not intended to replace advice given to you by your health care provider. Make sure you discuss any questions you have with your health care provider. Document Revised: 10/05/2020 Document Reviewed: 10/05/2020 Elsevier Patient Education  2021 [employer].

## 2021-04-06 NOTE — Progress Notes (Signed)
   Subjective:    Patient ID: Jermaine Harris, male    DOB: 1961-09-26, 60 y.o.   MRN: [id number]  HPI 60 yo male in non acute distress. Consents to telemedicine appointment. Started with symptoms of a slight sore throat, nasal congestion and runny nose o April 1st 2022.Marland Kitchen Felt bad yesterday thought it might have been a fever but not sure Did a home test this morning for Covid-19 which was positive. He  took Ibuprofen 400mg  this morning and felt better.   No loss of taste or smell. Denies CP, SOB, or obvious fever.   Lives with spouse and 24 yo daugther  97%RA O2 patient has his own oximeter.   Past Medical History:  Diagnosis Date  . Actinic keratosis   . Claustrophobia   . DM II (diabetes mellitus, type II), controlled ([place])   . GAD (generalized anxiety disorder)    claustrophobia, some panic attacks  . HTN (hypertension)   . Irritable bowel syndrome   . Lumbar herniated disc 06/2015  . Seasonal allergies   History of type II Diabetes Mellitus Dyslipidemia IBS HTN Diastolic CHF H/O Legionella pneumonia  Allergies  Allergen Reactions  . Ace Inhibitors Cough     Review of Systems  Constitutional: Negative for chills, fatigue and fever.  HENT: Positive for congestion, postnasal drip, rhinorrhea and sore throat. Negative for ear pain, sinus pressure and sinus pain.   Eyes: Positive for itching.  Respiratory: Positive for cough. Negative for chest tightness, shortness of breath and wheezing.   Cardiovascular: Negative for chest pain, palpitations and leg swelling.  Gastrointestinal: Negative for abdominal pain and diarrhea.  Allergic/Immunologic: Positive for environmental allergies.   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    Objective:   Physical Exam  AXOX3 No physical performed due to telemedicine appointment. Cough noted on phone call  Covid-19 positive at home test.    Assessment & Plan:   Covid-19 Possible candidate for monoclonal antibiodies. Isolate x  5 days, rest, increase fluids. Mask and stay apart by 6 ft. And wash hands often. Return to work on May 7th if all symptoms are improving and no fever x 24 hours with no Motrin or Tylenol taken. Did contact Dr. Rosanna Randy for his opinion and agrees on monoclonal antibody referral. [facility] will contact patient and screen him to see if he qualifies, explained to patient.  If he has not heard from them by 10 am he is to contact me. Patient verbalizes understanding and has no questions at the end of our conversation.

## 2021-04-07 ENCOUNTER — Telehealth: Payer: Self-pay | Admitting: Adult Health

## 2021-04-07 ENCOUNTER — Telehealth: Payer: Self-pay | Admitting: Family Medicine

## 2021-04-07 MED ORDER — NIRMATRELVIR/RITONAVIR (PAXLOVID)TABLET: 3.0000 | ORAL_TABLET | Freq: Two times a day (BID) | ORAL | 0 refills | Status: AC

## 2021-04-07 NOTE — Telephone Encounter (Signed)
Called to discuss with patient about COVID-19 symptoms and the use of one of the available treatments for those with mild to moderate Covid symptoms and at a high risk of hospitalization.  Pt appears to qualify for outpatient treatment due to co-morbid conditions and/or a member of an at-risk group in accordance with the FDA Emergency Use Authorization.      Unable to reach pt - LMOM   Ambers Iyengar C Susa Bones   

## 2021-04-07 NOTE — Telephone Encounter (Signed)
Spoke with pt asking when sxs started.  Started 04/04/21.  Still c/o mild cough, nasal congestion and low-grade fever- 98.8.  Says he missed call from infusion team but call them back and lvm.  I relayed CDC guidelines and supportive supplements.  Pt verbalizes understanding.

## 2021-04-07 NOTE — Addendum Note (Signed)
Addended by: Ria Bush on: 04/07/2021 06:02 PM   Modules accepted: Orders

## 2021-04-07 NOTE — Telephone Encounter (Signed)
Spoke with patient. First day of symptoms 04/04/2021.  Positive COVID 04/05/2021  Current symptoms include: mild cough, nasal congestion, sneeze.   COVID vaccine [employer], booster x2 latest 03/18/2021.   Risk factors include: HTN, DM, obesity, recent legionella pneumonia, HFpEF   Discussed treatment options - he is fully vaccinated and without known immunosuppression or chemo - will Rx Paxlovid antiviral - will send to pharmacy.    Drug interactions: rec no xanax while on paxlovid.  Reviewed below: Let us know right away if any worsening shortness of breath or persistent productive cough or fever.   Follow these current CDC guidelines for self-isolation: - Stay home for 5 days, starting the day after your symptoms (The first day is really day 0). - If you have no symptoms or your symptoms are resolving after 5 days, you can leave your house. - Continue to wear a mask around others for 5 additional days. **If you have a fever, continue to stay home until your fever resolves for 24 hours without fever-reducing medications.**  Here are some of the supportive therapies that may help with symptoms of a COVID-19 infection: A. Zinc Lozenge 75 to 100mg  daily B. Vitamin C 500mg  twice a day C. Vitamin D (Cholecalciferol) 2000 IU daily

## 2021-04-07 NOTE — Telephone Encounter (Signed)
Pt seen yesterday by [facility] clinic, positive COVID. Looks like he's being set up for mAb infusion.  plz get date of symptom onset and update on current symptoms.   Let us know right away if any worsening shortness of breath or persistent productive cough or fever.   Follow these current CDC guidelines for self-isolation: - Stay home for 5 days, starting the day after your symptoms (The first day is really day 0). - If you have no symptoms or your symptoms are resolving after 5 days, you can leave your house. - Continue to wear a mask around others for 5 additional days. **If you have a fever, continue to stay home until your fever resolves for 24 hours without fever-reducing medications.**  Here are some of the supportive therapies that may help with symptoms of a COVID-19 infection: A. Zinc Lozenge 75 to 100mg  daily B. Vitamin C 500mg  twice a day C. Vitamin D (Cholecalciferol) 2000 IU daily

## 2021-04-22 ENCOUNTER — Encounter: Payer: Self-pay | Admitting: Family Medicine

## 2021-06-08 ENCOUNTER — Encounter: Payer: Self-pay | Admitting: Family Medicine

## 2021-06-09 MED ORDER — OZEMPIC (0.25 OR 0.5 MG/DOSE) 2 MG/1.5ML ~~LOC~~ SOPN: PEN_INJECTOR | SUBCUTANEOUS | 3 refills | Status: AC

## 2021-07-03 ENCOUNTER — Other Ambulatory Visit: Payer: Self-pay | Admitting: Family Medicine

## 2021-07-17 ENCOUNTER — Other Ambulatory Visit: Payer: Self-pay | Admitting: Family Medicine

## 2021-07-17 DIAGNOSIS — Z125 Encounter for screening for malignant neoplasm of prostate: Secondary | ICD-10-CM

## 2021-07-17 DIAGNOSIS — E1136 Type 2 diabetes mellitus with diabetic cataract: Secondary | ICD-10-CM

## 2021-07-17 DIAGNOSIS — R200 Anesthesia of skin: Secondary | ICD-10-CM

## 2021-07-17 DIAGNOSIS — D75839 Thrombocytosis, unspecified: Secondary | ICD-10-CM

## 2021-07-17 DIAGNOSIS — E1169 Type 2 diabetes mellitus with other specified complication: Secondary | ICD-10-CM

## 2021-07-18 ENCOUNTER — Other Ambulatory Visit: Payer: Self-pay | Admitting: Family Medicine

## 2021-07-18 DIAGNOSIS — E1136 Type 2 diabetes mellitus with diabetic cataract: Secondary | ICD-10-CM

## 2021-07-18 DIAGNOSIS — E1169 Type 2 diabetes mellitus with other specified complication: Secondary | ICD-10-CM

## 2021-07-19 ENCOUNTER — Ambulatory Visit: Payer: BC Managed Care – PPO | Admitting: Cardiology

## 2021-07-19 ENCOUNTER — Other Ambulatory Visit: Payer: Self-pay

## 2021-07-19 ENCOUNTER — Encounter: Payer: Self-pay | Admitting: Cardiology

## 2021-07-19 VITALS — BP 130/74 | HR 70 | Ht 68.0 in | Wt 200.0 lb

## 2021-07-19 DIAGNOSIS — R0602 Shortness of breath: Secondary | ICD-10-CM

## 2021-07-19 DIAGNOSIS — I10 Essential (primary) hypertension: Secondary | ICD-10-CM | POA: Diagnosis not present

## 2021-07-19 NOTE — Patient Instructions (Signed)
Medication Instructions:  Your physician recommends that you continue on your current medications as directed. Please refer to the Current Medication list given to you today.  *If you need a refill on your cardiac medications before your next appointment, please call your pharmacy*   Lab Work: None ordered If you have labs (blood work) drawn today and your tests are completely normal, you will receive your results only by: [place] (if you have MyChart) OR A paper copy in the mail If you have any lab test that is abnormal or we need to change your treatment, we will call you to review the results.   Testing/Procedures:  None ordered   Follow-Up: At [facility], you and your health needs are our priority.  As part of our continuing mission to provide you with exceptional heart care, we have created designated Provider Care Teams.  These Care Teams include your primary Cardiologist (physician) and Advanced Practice Providers (APPs -  Physician Assistants and Nurse Practitioners) who all work together to provide you with the care you need, when you need it.  We recommend signing up for the patient portal called "MyChart".  Sign up information is provided on this After Visit Summary.  MyChart is used to connect with patients for Virtual Visits (Telemedicine).  Patients are able to view lab/test results, encounter notes, upcoming appointments, etc.  Non-urgent messages can be sent to your provider as well.   To learn more about what you can do with MyChart, go to [email or web address].    Your next appointment:   Follow up as needed   The format for your next appointment:   In Person  Provider:   You may see Kate Sable, MD or one of the following Advanced Practice Providers on your designated Care Team:   Murray Hodgkins, NP Christell Faith, PA-C Marrianne Mood, PA-C Cadence Kathlen Mody, [place]   Other Instructions

## 2021-07-19 NOTE — Progress Notes (Signed)
Cardiology Office Note:    Date:  07/19/2021   ID:  Jermaine Harris, DOB August 09, 1961, MRN OF:[id number]  PCP:  Ria Bush, [place]  Cardiologist:  Kate Sable, MD  Advanced Practice Provider:  No care team member to display Electrophysiologist:  None       Referring MD: Ria Bush, MD   Chief Complaint  Patient presents with   Other    6 month follow up. Meds reviewed verbally with patient.     History of Present Illness:    Jermaine Harris is a 59 y.o. male with a hx of hypertension, diabetes, who presents for follow-up.  Previously seen due to possible diastolic dysfunction and shortness of breath.  Echocardiogram was reviewed by myself showing indeterminate/normal diastolic function.  Shortness of breath was improving in the context of her recent pneumonia.  Presents today for ongoing monitoring of shortness of breath.  He states his symptoms are completely resolved, feels well, back to his baseline.  Has no concerns at this time.  Prior notes Patient admitted to [facility] on 01/2021 with flulike symptoms after travel to [place].  Diagnosed with bilateral pneumonia secondary to Legionella and respiratory failure.  He was managed with antibiotics with improvement in clinical status.  Eventually discharged.  Echocardiogram obtained during admission on 01/2021 showed/was read as having normal systolic function and grade 2 diastolic dysfunction.   Past Medical History:  Diagnosis Date   Actinic keratosis    Claustrophobia    DM II (diabetes mellitus, type II), controlled ([place])    GAD (generalized anxiety disorder)    claustrophobia, some panic attacks   HTN (hypertension)    Irritable bowel syndrome    Lumbar herniated disc 06/2015   Seasonal allergies     Past Surgical History:  Procedure Laterality Date   COLON SURGERY     COLONOSCOPY  2012   mild diverticulosis, rec rpt 5 yrs 2/2 fmhx polyps   COLONOSCOPY WITH PROPOFOL N/A  05/29/2019   TA, diverticulosis, rpt 5 yrs Bonna Gains, [place] B, MD)   MICRODISCECTOMY LUMBAR  07/2015   L4/5/S1 Trenton Gammon)   TONSILLECTOMY      Current Medications: Current Meds  Medication Sig   albuterol (VENTOLIN HFA) 108 (90 Base) MCG/ACT inhaler Inhale 2 puffs into the lungs every 6 (six) hours as needed for up to 14 days for wheezing or shortness of breath.   ALPRAZolam (NIRAVAM) 0.5 MG dissolvable tablet Take 1 tablet (0.5 mg total) by mouth daily as needed for anxiety (plane rides).   citalopram (CELEXA) 20 MG tablet Take 1 tablet (20 mg total) by mouth daily.   gabapentin (NEURONTIN) 300 MG capsule TAKE 1 CAPSULE BY MOUTH EVERYDAY AT BEDTIME   ibuprofen (ADVIL,MOTRIN) 200 MG tablet Take 200 mg by mouth as needed.   metFORMIN (GLUCOPHAGE-XR) 500 MG 24 hr tablet TAKE 1 TABLET BY MOUTH EVERY DAY   Semaglutide,0.25 or 0.'5MG'$ /DOS, (OZEMPIC, 0.25 OR 0.5 MG/DOSE,) 2 MG/1.5ML SOPN Inject 0.25 mg into the skin once a week for 14 days, THEN 0.5 mg once a week.   torsemide (DEMADEX) 20 MG tablet Take 1 tablet (20 mg total) by mouth daily as needed (swelling, elevated blood pressure).   valsartan-hydrochlorothiazide (DIOVAN-HCT) 160-12.5 MG tablet TAKE 1 TABLET BY MOUTH EVERY DAY     Allergies:   Ace inhibitors   Social History   Socioeconomic History   Marital status: Married    Spouse name: Not on file   Number of children: Not on  file   Years of education: Not on file   Highest education level: Not on file  Occupational History   Not on file  Tobacco Use   Smoking status: Never   Smokeless tobacco: Never  Vaping Use   Vaping Use: Never used  Substance and Sexual Activity   Alcohol use: Yes    Alcohol/week: 0.0 standard drinks    Comment: couple of drinks per week   Drug use: Never   Sexual activity: Not on file  Other Topics Concern   Not on file  Social History Narrative   Divorced from wife who was having affair (2015)   Newly married summer 2017   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  ROS:   Please see the history of present illness.     All other systems reviewed and are negative.  EKGs/Labs/Other Studies Reviewed:    The following studies were reviewed today:   EKG:  EKG is ordered today.  EKG shows normal sinus rhythm, normal ECG  Recent Labs: 01/11/2021: ALT 60 01/12/2021: B Natriuretic Peptide 23.8 01/15/2021: Magnesium 2.4 01/18/2021: Hemoglobin 13.3; Platelets 502; TSH 2.725 02/09/2021: BUN 28; Creatinine, Ser 1.27; Potassium 4.1; Sodium 138  Recent Lipid Panel    Component Value Date/Time   CHOL 129 06/25/2020 1247   TRIG 192.0 (H) 06/25/2020 1247   HDL 30.10 (L) 06/25/2020 1247   CHOLHDL 4 06/25/2020 1247   VLDL 38.4 06/25/2020 1247   LDLCALC 61 06/25/2020 1247   LDLDIRECT 90.0 11/16/2017 0847     Risk Assessment/Calculations:      Physical Exam:    VS:  BP 130/74 (BP Location: Left Arm, Patient Position: Sitting, Cuff Size: Large)   Pulse 70   Ht '5\' 8"'$  (1.727 m)   Wt 200 lb (90.7 kg)   SpO2 97%   BMI 30.41 kg/m     Wt Readings from Last 3 Encounters:  07/19/21 200 lb (90.7 kg)  02/24/21 198 lb (89.8 kg)  02/09/21 198 lb (89.8 kg)     GEN:  Well nourished, well developed in no acute distress HEENT: Normal NECK: No JVD; No carotid bruits LYMPHATICS: No lymphadenopathy CARDIAC: RRR, no murmurs,  rubs, gallops RESPIRATORY:  Clear to auscultation without rales, wheezing or rhonchi  ABDOMEN: Soft, non-tender, non-distended MUSCULOSKELETAL:  No edema; No deformity  SKIN: Warm and dry NEUROLOGIC:  Alert and oriented x 3 PSYCHIATRIC:  Normal affect   ASSESSMENT:    1. Shortness of breath   2. Primary hypertension     PLAN:    In order of problems listed above:  Shortness of breath in the setting of pneumonia.  Completely resolved.  Previous echo with normal systolic function, indeterminate sleep or normal diastolic function.  No indication for additional testing or stress test at this time. Hypertension, BP controlled, continue valsartan, HCTZ.  Follow-up as  needed     Medication Adjustments/Labs and Tests Ordered: Current medicines are reviewed at length with the patient today.  Concerns regarding medicines are outlined above.  Orders Placed This Encounter  Procedures   EKG 12-Lead    No orders of the defined types were placed in this encounter.   Patient Instructions  Medication Instructions:  Your physician recommends that you continue on your current medications as directed. Please refer to the Current Medication list given to you today.  *If you need a refill on your cardiac medications before your next appointment, please call your pharmacy*   Lab Work: None ordered If you have labs (blood work) drawn today and your tests are completely normal, you will receive your results only by: [place] (if you have MyChart) OR A paper copy in the mail If you have any lab test that is abnormal or we need to change your treatment, we will call you to review the results.   Testing/Procedures:  None ordered   Follow-Up: At [facility], you and your health needs are our priority.  As part of our continuing mission to provide you with exceptional heart care, we have created designated Provider Care Teams.  These Care Teams include your primary Cardiologist  (physician) and Advanced Practice Providers (APPs -  Physician Assistants and Nurse Practitioners) who all work together to provide you with the care you need, when you need it.  We recommend signing up for the patient portal called "MyChart".  Sign up information is provided on this After Visit Summary.  MyChart is used to connect with patients for Virtual Visits (Telemedicine).  Patients are able to view lab/test results, encounter notes, upcoming appointments, etc.  Non-urgent messages can be sent to your provider as well.   To learn more about what you can do with MyChart, go to [email or web address].    Your next appointment:   Follow up as needed   The format for your next appointment:   In Person  Provider:   You may see Kate Sable, MD or one of the following Advanced Practice Providers on your designated Care Team:   Murray Hodgkins, NP Christell Faith, PA-C Marrianne Mood, PA-C Cadence Denison, [place]   Other Instructions    Signed, Kate Sable, MD  07/19/2021 11:41 AM    [place]

## 2021-07-20 ENCOUNTER — Other Ambulatory Visit (INDEPENDENT_AMBULATORY_CARE_PROVIDER_SITE_OTHER): Payer: BC Managed Care – PPO

## 2021-07-20 DIAGNOSIS — E1169 Type 2 diabetes mellitus with other specified complication: Secondary | ICD-10-CM | POA: Diagnosis not present

## 2021-07-20 DIAGNOSIS — E785 Hyperlipidemia, unspecified: Secondary | ICD-10-CM

## 2021-07-20 DIAGNOSIS — Z125 Encounter for screening for malignant neoplasm of prostate: Secondary | ICD-10-CM

## 2021-07-20 DIAGNOSIS — D75839 Thrombocytosis, unspecified: Secondary | ICD-10-CM

## 2021-07-20 DIAGNOSIS — E1136 Type 2 diabetes mellitus with diabetic cataract: Secondary | ICD-10-CM | POA: Diagnosis not present

## 2021-07-20 LAB — COMPREHENSIVE METABOLIC PANEL
ALT: 23 U/L (ref 0–53)
AST: 17 U/L (ref 0–37)
Albumin: 4.5 g/dL (ref 3.5–5.2)
Alkaline Phosphatase: 52 U/L (ref 39–117)
BUN: 28 mg/dL — ABNORMAL HIGH (ref 6–23)
CO2: 28 mEq/L (ref 19–32)
Calcium: 9.6 mg/dL (ref 8.4–10.5)
Chloride: 103 mEq/L (ref 96–112)
Creatinine, Ser: 1.38 mg/dL (ref 0.40–1.50)
GFR: 55.27 mL/min — ABNORMAL LOW (ref >60.00)
Glucose, Bld: 101 mg/dL — ABNORMAL HIGH (ref 70–99)
Potassium: 4.6 mEq/L (ref 3.5–5.1)
Sodium: 139 mEq/L (ref 135–145)
Total Bilirubin: 0.3 mg/dL (ref 0.2–1.2)
Total Protein: 7.1 g/dL (ref 6.0–8.3)

## 2021-07-20 LAB — CBC WITH DIFFERENTIAL/PLATELET
Basophils Absolute: 0.1 10*3/uL (ref 0.0–0.1)
Basophils Relative: 1.1 % (ref 0.0–3.0)
Eosinophils Absolute: 0.3 10*3/uL (ref 0.0–0.7)
Eosinophils Relative: 4.1 % (ref 0.0–5.0)
HCT: 39.5 % (ref 39.0–52.0)
Hemoglobin: 13.1 g/dL (ref 13.0–17.0)
Lymphocytes Relative: 23.2 % (ref 12.0–46.0)
Lymphs Abs: 1.5 10*3/uL (ref 0.7–4.0)
MCHC: 33.1 g/dL (ref 30.0–36.0)
MCV: 83.4 fl (ref 78.0–100.0)
Monocytes Absolute: 0.6 10*3/uL (ref 0.1–1.0)
Monocytes Relative: 9.8 % (ref 3.0–12.0)
Neutro Abs: 4 10*3/uL (ref 1.4–7.7)
Neutrophils Relative %: 61.8 % (ref 43.0–77.0)
Platelets: 220 10*3/uL (ref 150.0–400.0)
RBC: 4.73 Mil/uL (ref 4.22–5.81)
RDW: 14.4 % (ref 11.5–15.5)
WBC: 6.5 10*3/uL (ref 4.0–10.5)

## 2021-07-20 LAB — LIPID PANEL
Cholesterol: 146 mg/dL (ref 0–200)
HDL: 34.6 mg/dL — ABNORMAL LOW (ref >39.00)
LDL Cholesterol: 71 mg/dL (ref 0–99)
NonHDL: 110.97
Total CHOL/HDL Ratio: 4
Triglycerides: 198 mg/dL — ABNORMAL HIGH (ref 0.0–149.0)
VLDL: 39.6 mg/dL (ref 0.0–40.0)

## 2021-07-20 LAB — MICROALBUMIN / CREATININE URINE RATIO
Creatinine,U: 140.6 mg/dL
Microalb Creat Ratio: 0.5 mg/g (ref 0.0–30.0)
Microalb, Ur: <0.7 mg/dL (ref 0.0–1.9)

## 2021-07-20 LAB — PSA: PSA: 0.46 ng/mL (ref 0.10–4.00)

## 2021-07-20 LAB — HEMOGLOBIN A1C: Hgb A1c MFr Bld: 6.6 % — ABNORMAL HIGH (ref 4.6–6.5)

## 2021-07-23 ENCOUNTER — Other Ambulatory Visit: Payer: BC Managed Care – PPO

## 2021-07-24 ENCOUNTER — Other Ambulatory Visit: Payer: Self-pay | Admitting: Family Medicine

## 2021-07-26 ENCOUNTER — Other Ambulatory Visit: Payer: Self-pay

## 2021-07-26 ENCOUNTER — Ambulatory Visit (INDEPENDENT_AMBULATORY_CARE_PROVIDER_SITE_OTHER): Payer: BC Managed Care – PPO | Admitting: Family Medicine

## 2021-07-26 ENCOUNTER — Encounter: Payer: Self-pay | Admitting: Family Medicine

## 2021-07-26 VITALS — BP 126/78 | HR 71 | Temp 97.7°F | Ht 67.25 in | Wt 197.0 lb

## 2021-07-26 DIAGNOSIS — F411 Generalized anxiety disorder: Secondary | ICD-10-CM

## 2021-07-26 DIAGNOSIS — Z0000 Encounter for general adult medical examination without abnormal findings: Secondary | ICD-10-CM

## 2021-07-26 DIAGNOSIS — E669 Obesity, unspecified: Secondary | ICD-10-CM

## 2021-07-26 DIAGNOSIS — K580 Irritable bowel syndrome with diarrhea: Secondary | ICD-10-CM

## 2021-07-26 DIAGNOSIS — E1169 Type 2 diabetes mellitus with other specified complication: Secondary | ICD-10-CM

## 2021-07-26 DIAGNOSIS — N289 Disorder of kidney and ureter, unspecified: Secondary | ICD-10-CM

## 2021-07-26 DIAGNOSIS — I10 Essential (primary) hypertension: Secondary | ICD-10-CM | POA: Diagnosis not present

## 2021-07-26 DIAGNOSIS — N183 Chronic kidney disease, stage 3 unspecified: Secondary | ICD-10-CM | POA: Insufficient documentation

## 2021-07-26 DIAGNOSIS — E1136 Type 2 diabetes mellitus with diabetic cataract: Secondary | ICD-10-CM

## 2021-07-26 DIAGNOSIS — E785 Hyperlipidemia, unspecified: Secondary | ICD-10-CM

## 2021-07-26 MED ORDER — ATORVASTATIN CALCIUM 20 MG PO TABS: 20.0000 mg | ORAL_TABLET | Freq: Every day | ORAL | 3 refills | Status: DC

## 2021-07-26 MED ORDER — METFORMIN HCL ER 500 MG PO TB24: 500.0000 mg | ORAL_TABLET | Freq: Every day | ORAL | 3 refills | Status: DC

## 2021-07-26 MED ORDER — VALSARTAN-HYDROCHLOROTHIAZIDE 160-12.5 MG PO TABS
1.0000 | ORAL_TABLET | Freq: Every day | ORAL | 3 refills | Status: DC
Start: 2021-07-26 — End: 2022-08-02

## 2021-07-26 MED ORDER — OZEMPIC (0.25 OR 0.5 MG/DOSE) 2 MG/1.5ML ~~LOC~~ SOPN: 0.5000 mg | PEN_INJECTOR | SUBCUTANEOUS | 3 refills | Status: DC

## 2021-07-26 MED ORDER — ALPRAZOLAM 0.5 MG PO TBDP: 0.5000 mg | ORAL_TABLET | Freq: Every day | ORAL | 0 refills | Status: AC | PRN

## 2021-07-26 MED ORDER — CITALOPRAM HYDROBROMIDE 20 MG PO TABS: 20.0000 mg | ORAL_TABLET | Freq: Every day | ORAL | 3 refills | Status: DC

## 2021-07-26 MED ORDER — GABAPENTIN 300 MG PO CAPS: ORAL_CAPSULE | ORAL | 3 refills | Status: DC

## 2021-07-26 NOTE — Assessment & Plan Note (Signed)
Stable period - discussed PRN probiotic trial.

## 2021-07-26 NOTE — Assessment & Plan Note (Signed)
Encouraged ongoing healthy diet and lifestyle choices to affect sustainable weight loss. He continues tolerating ozempic well.

## 2021-07-26 NOTE — Patient Instructions (Addendum)
Start atorvastatin '20mg'$  daily for cholesterol levels. Watch for muscle aches on the medicine Increase water intake, limit ibuprofen  Stay off torsemide.  Good to see you today Return as needed or in 6 months for diabetes check   Health Maintenance, Male Adopting a healthy lifestyle and getting preventive care are important in promoting health and wellness. Ask your health care provider about: The right schedule for you to have regular tests and exams. Things you can do on your own to prevent diseases and keep yourself healthy. What should I know about diet, weight, and exercise? Eat a healthy diet  Eat a diet that includes plenty of vegetables, fruits, low-fat dairy products, and lean protein. Do not eat a lot of foods that are high in solid fats, added sugars, or sodium.  Maintain a healthy weight Body mass index (BMI) is a measurement that can be used to identify possible weight problems. It estimates body fat based on height and weight. Your health care provider can help determine your BMI and help you achieve or maintain ahealthy weight. Get regular exercise Get regular exercise. This is one of the most important things you can do for your health. Most adults should: Exercise for at least 150 minutes each week. The exercise should increase your heart rate and make you sweat (moderate-intensity exercise). Do strengthening exercises at least twice a week. This is in addition to the moderate-intensity exercise. Spend less time sitting. Even light physical activity can be beneficial. Watch cholesterol and blood lipids Have your blood tested for lipids and cholesterol at 61 years of age, then havethis test every 5 years. You may need to have your cholesterol levels checked more often if: Your lipid or cholesterol levels are high. You are older than 61 years of age. You are at high risk for heart disease. What should I know about cancer screening? Many types of cancers can be detected  early and may often be prevented. Depending on your health history and family history, you may need to have cancer screening at various ages. This may include screening for: Colorectal cancer. Prostate cancer. Skin cancer. Lung cancer. What should I know about heart disease, diabetes, and high blood pressure? Blood pressure and heart disease High blood pressure causes heart disease and increases the risk of stroke. This is more likely to develop in people who have high blood pressure readings, are of African descent, or are overweight. Talk with your health care provider about your target blood pressure readings. Have your blood pressure checked: Every 3-5 years if you are 33-53 years of age. Every year if you are 68 years old or older. If you are between the ages of 15 and 64 and are a current or former smoker, ask your health care provider if you should have a one-time screening for abdominal aortic aneurysm (AAA). Diabetes Have regular diabetes screenings. This checks your fasting blood sugar level. Have the screening done: Once every three years after age 30 if you are at a normal weight and have a low risk for diabetes. More often and at a younger age if you are overweight or have a high risk for diabetes. What should I know about preventing infection? Hepatitis B If you have a higher risk for hepatitis B, you should be screened for this virus. Talk with your health care provider to find out if you are at risk forhepatitis B infection. Hepatitis C Blood testing is recommended for: Everyone born from [birth year] through 1966. Anyone with known risk  factors for hepatitis C. Sexually transmitted infections (STIs) You should be screened each year for STIs, including gonorrhea and chlamydia, if: You are sexually active and are younger than 61 years of age. You are older than 61 years of age and your health care provider tells you that you are at risk for this type of infection. Your sexual  activity has changed since you were last screened, and you are at increased risk for chlamydia or gonorrhea. Ask your health care provider if you are at risk. Ask your health care provider about whether you are at high risk for HIV. Your health care provider may recommend a prescription medicine to help prevent HIV infection. If you choose to take medicine to prevent HIV, you should first get tested for HIV. You should then be tested every 3 months for as long as you are taking the medicine. Follow these instructions at home: Lifestyle Do not use any products that contain nicotine or tobacco, such as cigarettes, e-cigarettes, and chewing tobacco. If you need help quitting, ask your health care provider. Do not use street drugs. Do not share needles. Ask your health care provider for help if you need support or information about quitting drugs. Alcohol use Do not drink alcohol if your health care provider tells you not to drink. If you drink alcohol: Limit how much you have to 0-2 drinks a day. Be aware of how much alcohol is in your drink. In the [place] one drink equals one 12 oz bottle of beer (355 mL), one 5 oz glass of wine (148 mL), or one 1 oz glass of hard liquor (44 mL). General instructions Schedule regular health, dental, and eye exams. Stay current with your vaccines. Tell your health care provider if: You often feel depressed. You have ever been abused or do not feel safe at home. Summary Adopting a healthy lifestyle and getting preventive care are important in promoting health and wellness. Follow your health care provider's instructions about healthy diet, exercising, and getting tested or screened for diseases. Follow your health care provider's instructions on monitoring your cholesterol and blood pressure. This information is not intended to replace advice given to you by your health care provider. Make sure you discuss any questions you have with your healthcare  provider. Document Revised: 11/14/2018 Document Reviewed: 11/14/2018 Elsevier Patient Education  2022 [employer].

## 2021-07-26 NOTE — Assessment & Plan Note (Signed)
Preventative protocols reviewed and updated unless pt declined. Discussed healthy diet and lifestyle.  

## 2021-07-26 NOTE — Assessment & Plan Note (Signed)
Agrees to start statin in diabetic.  The 10-year ASCVD risk score Mikey Bussing DC Brooke Bonito., et al., 2013) is: 19.1%   Values used to calculate the score:     Age: 60 years     Sex: Male     Is Non-Hispanic African American: No     Diabetic: Yes     Tobacco smoker: No     Systolic Blood Pressure: 123XX123 mmHg     Is BP treated: Yes     HDL Cholesterol: 34.6 mg/dL     Total Cholesterol: 146 mg/dL

## 2021-07-26 NOTE — Assessment & Plan Note (Signed)
Kidney function slightly elevated from prior - rec increase water intake, limit NSAIDs. Will continue to monitor. Consider recheck at 53mof/u .

## 2021-07-26 NOTE — Progress Notes (Signed)
Patient ID: Jermaine Harris, male    DOB: [birth year], 60 y.o.   MRN: [id number]  This visit was conducted in person.  BP 126/78   Pulse 71   Temp 97.7 F (36.5 C) (Temporal)   Ht 5' 7.25" (1.708 m)   Wt 197 lb (89.4 kg)   SpO2 96%   BMI 30.63 kg/m    CC: CPE Subjective:   HPI: Jermaine Harris is a 61 y.o. male presenting on 07/26/2021 for Annual Exam   Gabapentin effective for neuropathic pain. Persistent L leg numbness after herniated disc s/p microdiscectomy L4/5/S1 (2016).    Claustrophobia has improved with celexa. Effexor previously raised blood pressure and noted weight gain. xanax PRN flights.   Legionella pneumonia s/p hospitalization earlier this year - symptoms fully resolved.   Tolerating ozempic well.    Preventative: COLONOSCOPY WITH PROPOFOL 05/29/2019 - TA, diverticulosis, rpt 5 yrs (Tahiliani, Lennette Bihari, MD)  Prostate cancer screening - yearly screening. Nocturia x1, some urinary urgency.  Lung cancer screening - not eligible  Flu shot yearly COVID vaccine - [place] 02/2020 x2, booster x2 09/2020, 03/2021 Pneumovax 2019 Td 2010, Tdap 12/2018 Shingrix - 06/2020, 02/2021 Got MMR at school this fall.  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 Interim medical history since our last visit reviewed. Allergies and medications reviewed and updated. Outpatient Medications Prior to Visit  Medication Sig Dispense Refill   ibuprofen (ADVIL,MOTRIN) 200 MG tablet Take 200 mg by mouth as needed.     ALPRAZolam (NIRAVAM) 0.5 MG dissolvable tablet Take 1 tablet (0.5 mg total) by mouth  daily as needed for anxiety (plane rides). 20 tablet 0   citalopram (CELEXA) 20 MG tablet Take 1 tablet (20 mg total) by mouth daily. 90 tablet 3   gabapentin (NEURONTIN) 300 MG capsule TAKE 1 CAPSULE BY MOUTH EVERYDAY AT BEDTIME 90 capsule 3   metFORMIN (GLUCOPHAGE-XR) 500 MG 24 hr tablet TAKE 1 TABLET BY MOUTH EVERY DAY 90 tablet 1   torsemide (DEMADEX) 20 MG tablet Take 1 tablet (20 mg total) by mouth daily as needed (swelling, elevated blood pressure).     valsartan-hydrochlorothiazide (DIOVAN-HCT) 160-12.5 MG tablet TAKE 1 TABLET BY MOUTH EVERY DAY 90 tablet 0   albuterol (VENTOLIN HFA) 108 (90 Base) MCG/ACT inhaler Inhale 2 puffs into the lungs every 6 (six) hours as needed for up to 14 days for wheezing or shortness of breath. 6.7 g 0   No facility-administered medications prior to visit.     Per HPI unless specifically indicated in ROS section below Review of Systems  Constitutional:  Negative for activity change, appetite change, chills, fatigue, fever and unexpected weight change.  HENT:  Negative for hearing loss.   Eyes:  Negative for visual disturbance.  Respiratory:  Negative for cough, chest tightness, shortness of breath and wheezing.   Cardiovascular:  Negative for chest pain, palpitations and leg swelling.  Gastrointestinal:  Negative for abdominal distention, abdominal pain, blood in stool, constipation, diarrhea, nausea and vomiting.  Genitourinary:  Negative for difficulty urinating and hematuria.  Musculoskeletal:  Negative for arthralgias, myalgias and neck pain.  Skin:  Negative for rash.  Neurological:  Negative for dizziness, seizures, syncope and headaches.  Hematological:  Negative for adenopathy. Does not bruise/bleed easily.  Psychiatric/Behavioral:  Negative for dysphoric mood. The patient is not nervous/anxious.    Objective:  BP 126/78   Pulse 71   Temp 97.7 F (36.5 C) (Temporal)   Ht 5' 7.25" (1.708 m)   Wt 197 lb (89.4 kg)   SpO2 96%   BMI 30.63  kg/m   Wt Readings from Last 3 Encounters:  07/26/21 197 lb (89.4 kg)  07/19/21 200 lb (90.7 kg)  02/24/21 198 lb (89.8 kg)      Physical Exam Vitals and nursing note reviewed.  Constitutional:      General: He is not in acute distress.    Appearance: Normal appearance. He is well-developed. He is not ill-appearing.  HENT:     Head: Normocephalic and atraumatic.     Right Ear: Hearing, tympanic membrane, ear canal and external ear normal.     Left Ear: Hearing, tympanic membrane, ear canal and external ear normal.  Eyes:     General: No scleral icterus.    Extraocular Movements: Extraocular movements intact.     Conjunctiva/sclera: Conjunctivae normal.     Pupils: Pupils are equal, round, and reactive to light.  Neck:     Thyroid: No thyroid mass or thyromegaly.  Cardiovascular:     Rate and Rhythm: Normal rate and regular rhythm.     Pulses: Normal pulses.          Radial pulses are 2+ on the right side and 2+ on the left side.     Heart sounds: Normal heart sounds. No murmur heard. Pulmonary:     Effort: Pulmonary effort is normal. No respiratory distress.     Breath sounds: Normal breath sounds. No wheezing, rhonchi or rales.  Abdominal:     General: Bowel sounds are normal. There is no distension.     Palpations: Abdomen is soft. There is no mass.     Tenderness: There is no abdominal tenderness. There is no guarding or rebound.     Hernia: No hernia is present.  Musculoskeletal:        General: Normal range of motion.     Cervical back: Normal range of motion and neck supple.     Right lower leg: No edema.     Left lower leg: No edema.  Lymphadenopathy:     Cervical: No cervical adenopathy.  Skin:    General: Skin is warm and dry.     Findings: No rash.  Neurological:     General: No focal deficit present.     Mental Status: He is alert and oriented to person, place, and time.  Psychiatric:        Mood and Affect: Mood normal.        Behavior: Behavior normal.         Thought Content: Thought content normal.        Judgment: Judgment normal.      Results for orders placed or performed in visit on 07/20/21  CBC with Differential/Platelet  Result Value Ref Range   WBC 6.5 4.0 - 10.5 K/uL   RBC 4.73 4.22 - 5.81 Mil/uL   Hemoglobin 13.1 13.0 - 17.0 g/dL   HCT 39.5 39.0 - 52.0 %   MCV 83.4 78.0 - 100.0 fl   MCHC 33.1 30.0 - 36.0 g/dL   RDW 14.4 11.5 - 15.5 %   Platelets  220.0 150.0 - 400.0 K/uL   Neutrophils Relative % 61.8 43.0 - 77.0 %   Lymphocytes Relative 23.2 12.0 - 46.0 %   Monocytes Relative 9.8 3.0 - 12.0 %   Eosinophils Relative 4.1 0.0 - 5.0 %   Basophils Relative 1.1 0.0 - 3.0 %   Neutro Abs 4.0 1.4 - 7.7 K/uL   Lymphs Abs 1.5 0.7 - 4.0 K/uL   Monocytes Absolute 0.6 0.1 - 1.0 K/uL   Eosinophils Absolute 0.3 0.0 - 0.7 K/uL   Basophils Absolute 0.1 0.0 - 0.1 K/uL  Microalbumin / creatinine urine ratio  Result Value Ref Range   Microalb, Ur <0.7 0.0 - 1.9 mg/dL   Creatinine,U 140.6 mg/dL   Microalb Creat Ratio 0.5 0.0 - 30.0 mg/g  PSA  Result Value Ref Range   PSA 0.46 0.10 - 4.00 ng/mL  Hemoglobin A1c  Result Value Ref Range   Hgb A1c MFr Bld 6.6 (H) 4.6 - 6.5 %  Comprehensive metabolic panel  Result Value Ref Range   Sodium 139 135 - 145 mEq/L   Potassium 4.6 3.5 - 5.1 mEq/L   Chloride 103 96 - 112 mEq/L   CO2 28 19 - 32 mEq/L   Glucose, Bld 101 (H) 70 - 99 mg/dL   BUN 28 (H) 6 - 23 mg/dL   Creatinine, Ser 1.38 0.40 - 1.50 mg/dL   Total Bilirubin 0.3 0.2 - 1.2 mg/dL   Alkaline Phosphatase 52 39 - 117 U/L   AST 17 0 - 37 U/L   ALT 23 0 - 53 U/L   Total Protein 7.1 6.0 - 8.3 g/dL   Albumin 4.5 3.5 - 5.2 g/dL   GFR 55.27 (L) >60.00 mL/min   Calcium 9.6 8.4 - 10.5 mg/dL  Lipid panel  Result Value Ref Range   Cholesterol 146 0 - 200 mg/dL   Triglycerides 198.0 (H) 0.0 - 149.0 mg/dL   HDL 34.60 (L) >39.00 mg/dL   VLDL 39.6 0.0 - 40.0 mg/dL   LDL Cholesterol 71 0 - 99 mg/dL   Total CHOL/HDL Ratio 4    NonHDL  110.97     Assessment & Plan:  This visit occurred during the SARS-CoV-2 public health emergency.  Safety protocols were in place, including screening questions prior to the visit, additional usage of staff PPE, and extensive cleaning of exam room while observing appropriate contact time as indicated for disinfecting solutions.   Problem List Items Addressed This Visit     Essential hypertension    Chronic, stable. Continue valsartan/hctz.       Relevant Medications   valsartan-hydrochlorothiazide (DIOVAN-HCT) 160-12.5 MG tablet   atorvastatin (LIPITOR) 20 MG tablet   Irritable bowel syndrome    Stable period - discussed PRN probiotic trial.       GAD (generalized anxiety disorder)    Stable period on celexa. Continue.       Relevant Medications   citalopram (CELEXA) 20 MG tablet   ALPRAZolam (NIRAVAM) 0.5 MG dissolvable tablet   Obesity, Class I, BMI 30-34.9    Encouraged ongoing healthy diet and lifestyle choices to affect sustainable weight loss. He continues tolerating ozempic well.       Health maintenance examination - Primary    Preventative protocols reviewed and updated unless pt declined. Discussed healthy diet and lifestyle.       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  Relevant Medications   valsartan-hydrochlorothiazide (DIOVAN-HCT) 160-12.5 MG tablet   metFORMIN (GLUCOPHAGE-XR) 500 MG 24 hr tablet   Semaglutide,0.25 or 0.5MG/DOS, (OZEMPIC, 0.25 OR 0.5 MG/DOSE,) 2 MG/1.5ML SOPN   atorvastatin (LIPITOR) 20 MG tablet   Dyslipidemia associated with type 2 diabetes mellitus ([place])    Agrees to start statin in diabetic.  The 10-year ASCVD risk score Mikey Bussing DC Brooke Bonito., et al., 2013) is: 19.1%   Values used to calculate the score:     Age: 1 years     Sex: Male     Is Non-Hispanic African American: No      Diabetic: Yes     Tobacco smoker: No     Systolic Blood Pressure: 574 mmHg     Is BP treated: Yes     HDL Cholesterol: 34.6 mg/dL     Total Cholesterol: 146 mg/dL       Relevant Medications   valsartan-hydrochlorothiazide (DIOVAN-HCT) 160-12.5 MG tablet   metFORMIN (GLUCOPHAGE-XR) 500 MG 24 hr tablet   Semaglutide,0.25 or 0.5MG/DOS, (OZEMPIC, 0.25 OR 0.5 MG/DOSE,) 2 MG/1.5ML SOPN   atorvastatin (LIPITOR) 20 MG tablet   Renal insufficiency    Kidney function slightly elevated from prior - rec increase water intake, limit NSAIDs. Will continue to monitor. Consider recheck at 79mof/u .         Meds ordered this encounter  Medications   valsartan-hydrochlorothiazide (DIOVAN-HCT) 160-12.5 MG tablet    Sig: Take 1 tablet by mouth daily.    Dispense:  90 tablet    Refill:  3   metFORMIN (GLUCOPHAGE-XR) 500 MG 24 hr tablet    Sig: Take 1 tablet (500 mg total) by mouth daily.    Dispense:  90 tablet    Refill:  3   gabapentin (NEURONTIN) 300 MG capsule    Sig: TAKE 1 CAPSULE BY MOUTH EVERYDAY AT BEDTIME    Dispense:  90 capsule    Refill:  3   citalopram (CELEXA) 20 MG tablet    Sig: Take 1 tablet (20 mg total) by mouth daily.    Dispense:  90 tablet    Refill:  3   ALPRAZolam (NIRAVAM) 0.5 MG dissolvable tablet    Sig: Take 1 tablet (0.5 mg total) by mouth daily as needed for anxiety (plane rides).    Dispense:  15 tablet    Refill:  0   Semaglutide,0.25 or 0.5MG/DOS, (OZEMPIC, 0.25 OR 0.5 MG/DOSE,) 2 MG/1.5ML SOPN    Sig: Inject 0.5 mg into the skin once a week.    Dispense:  4.5 mL    Refill:  3   atorvastatin (LIPITOR) 20 MG tablet    Sig: Take 1 tablet (20 mg total) by mouth daily.    Dispense:  90 tablet    Refill:  3   No orders of the defined types were placed in this encounter.   Patient instructions: Start atorvastatin 273mdaily for cholesterol levels. Watch for muscle aches on the medicine Increase water intake, limit ibuprofen  Stay off torsemide.  Good to  see you today Return as needed or in 6 months for diabetes check   Follow up plan: Return in about 6 months (around 01/26/2022) for follow up visit.  JaRia BushMD

## 2021-07-26 NOTE — Assessment & Plan Note (Signed)
Stable period on celexa. Continue.

## 2021-07-26 NOTE — Assessment & Plan Note (Signed)
Chronic, stable. Continue valsartan/hctz.

## 2021-07-26 NOTE — Assessment & Plan Note (Signed)
Chronic, significant improvement with addition of ozempic - continue this.

## 2021-08-27 DIAGNOSIS — Z23 Encounter for immunization: Secondary | ICD-10-CM | POA: Diagnosis not present

## 2021-09-15 ENCOUNTER — Ambulatory Visit: Payer: BC Managed Care – PPO

## 2021-09-15 ENCOUNTER — Other Ambulatory Visit: Payer: Self-pay

## 2021-09-15 DIAGNOSIS — Z23 Encounter for immunization: Secondary | ICD-10-CM

## 2022-01-26 ENCOUNTER — Ambulatory Visit: Payer: BC Managed Care – PPO | Admitting: Family Medicine

## 2022-01-31 ENCOUNTER — Ambulatory Visit: Payer: BC Managed Care – PPO | Admitting: Family Medicine

## 2022-01-31 ENCOUNTER — Encounter: Payer: Self-pay | Admitting: Family Medicine

## 2022-01-31 ENCOUNTER — Other Ambulatory Visit: Payer: Self-pay

## 2022-01-31 VITALS — BP 124/68 | HR 78 | Temp 97.2°F | Ht 67.5 in | Wt 202.0 lb

## 2022-01-31 DIAGNOSIS — E1136 Type 2 diabetes mellitus with diabetic cataract: Secondary | ICD-10-CM

## 2022-01-31 DIAGNOSIS — E1169 Type 2 diabetes mellitus with other specified complication: Secondary | ICD-10-CM | POA: Diagnosis not present

## 2022-01-31 DIAGNOSIS — N289 Disorder of kidney and ureter, unspecified: Secondary | ICD-10-CM

## 2022-01-31 DIAGNOSIS — E785 Hyperlipidemia, unspecified: Secondary | ICD-10-CM

## 2022-01-31 LAB — LIPID PANEL
Cholesterol: 79 mg/dL (ref 0–200)
HDL: 34.8 mg/dL — ABNORMAL LOW
LDL Cholesterol: 19 mg/dL (ref 0–99)
NonHDL: 43.71
Total CHOL/HDL Ratio: 2
Triglycerides: 126 mg/dL (ref 0.0–149.0)
VLDL: 25.2 mg/dL (ref 0.0–40.0)

## 2022-01-31 LAB — RENAL FUNCTION PANEL
Albumin: 4.6 g/dL (ref 3.5–5.2)
BUN: 23 mg/dL (ref 6–23)
CO2: 29 meq/L (ref 19–32)
Calcium: 9.5 mg/dL (ref 8.4–10.5)
Chloride: 103 meq/L (ref 96–112)
Creatinine, Ser: 1.27 mg/dL (ref 0.40–1.50)
GFR: 60.83 mL/min
Glucose, Bld: 100 mg/dL — ABNORMAL HIGH (ref 70–99)
Phosphorus: 3.7 mg/dL (ref 2.3–4.6)
Potassium: 4.1 meq/L (ref 3.5–5.1)
Sodium: 138 meq/L (ref 135–145)

## 2022-01-31 LAB — POCT GLYCOSYLATED HEMOGLOBIN (HGB A1C): Hemoglobin A1C: 5.8 % — AB (ref 4.0–5.6)

## 2022-01-31 NOTE — Assessment & Plan Note (Signed)
Update kidney function.  

## 2022-01-31 NOTE — Assessment & Plan Note (Addendum)
Chronic, great control on ozempic and metformin XR - continue this. He notes possible hypoglycemic symptoms in setting of increased activity - discussed need to plan for hypoglycemia when active, provided with 15-15 rule. Advised start monitoring sugars and if notes hypoglycemia <70 unrelated to increased activity, hold metformin XR.  Reassess at CPE.

## 2022-01-31 NOTE — Assessment & Plan Note (Signed)
Update FLP after starting atorvastatin.

## 2022-01-31 NOTE — Patient Instructions (Addendum)
You are doing well today.  A1c was wonderful!  Labs today.  Return as needed or in 6 months for physical.   The 15-15 rule for low sugars: If sugar reading below 70, have 15 grams of carbohydrate to raise your blood sugar and check it after 15 minutes. If its still below 70 mg/dL, have another serving. 15 grams of carbs may be: -Glucose tablets (see instructions) -Gel tube (see instructions) -4 ounces (1/2 cup) of juice or regular soda (not diet) -1 tablespoon of sugar, honey, or corn syrup -Hard candies, jellybeans or gumdrops--see food label for how many to consume  Repeat these steps until your blood sugar is at least 70 mg/dL. Once your blood sugar is back to normal, eat a meal or snack to make sure it doesnt lower again.

## 2022-01-31 NOTE — Progress Notes (Signed)
Patient ID: Jermaine Harris, male    DOB: [birth year], 61 y.o.   MRN: [id number]  This visit was conducted in person.  BP 124/68    Pulse 78    Temp (!) 97.2 F (36.2 C) (Temporal)    Ht 5' 7.5" (1.715 m)    Wt 202 lb (91.6 kg)    SpO2 98%    BMI 31.17 kg/m    CC: DM f/u visit  Subjective:   HPI: Jermaine Harris is a 61 y.o. male presenting on 01/31/2022 for Diabetes   DM - does not regularly check sugars. Compliant with antihyperglycemic regimen which includes: metformin XR 500mg  daily, ozempic 0.5mg  weekly. Rare hypoglycemic symptoms. Denies paresthesias, blurry vision. Last diabetic eye exam 02/2021 - upcoming appt . Glucometer brand: [facility] brand. Last foot exam: 10/2020 - due. DSME: declined.  Lab Results  Component Value Date   HGBA1C 5.8 (A) 01/31/2022   Diabetic Foot Exam - Simple   Simple Foot Form Diabetic Foot exam was performed with the following findings: Yes 01/31/2022 12:20 PM  Visual Inspection No deformities, no ulcerations, no other skin breakdown bilaterally: Yes Sensation Testing Intact to touch and monofilament testing bilaterally: Yes Pulse Check Posterior Tibialis and Dorsalis pulse intact bilaterally: Yes Comments    Lab Results  Component Value Date   MICROALBUR <0.7 07/20/2021        Relevant past medical, surgical, family and social history reviewed and updated as indicated. Interim medical history since our last visit reviewed. Allergies and medications reviewed and updated. Outpatient Medications Prior to Visit  Medication Sig Dispense Refill   ALPRAZolam (NIRAVAM) 0.5 MG dissolvable tablet Take 1 tablet (0.5 mg total) by mouth daily as needed for anxiety (plane rides). 15 tablet 0   atorvastatin (LIPITOR) 20 MG tablet Take 1 tablet (20 mg total) by mouth daily. 90 tablet 3   citalopram (CELEXA) 20 MG tablet Take 1 tablet (20 mg total) by mouth daily. 90 tablet 3   gabapentin (NEURONTIN) 300 MG capsule TAKE 1 CAPSULE BY MOUTH EVERYDAY AT BEDTIME 90  capsule 3   ibuprofen (ADVIL,MOTRIN) 200 MG tablet Take 200 mg by mouth as needed.     metFORMIN (GLUCOPHAGE-XR) 500 MG 24 hr tablet Take 1 tablet (500 mg total) by mouth daily. 90 tablet 3   Semaglutide,0.25 or 0.5MG /DOS, (OZEMPIC, 0.25 OR 0.5 MG/DOSE,) 2 MG/1.5ML SOPN Inject 0.5 mg into the skin once a week. 4.5 mL 3   valsartan-hydrochlorothiazide (DIOVAN-HCT) 160-12.5 MG tablet Take 1 tablet by mouth daily. 90 tablet 3   albuterol (VENTOLIN HFA) 108 (90 Base) MCG/ACT inhaler Inhale 2 puffs into the lungs every 6 (six) hours as needed for up to 14 days for wheezing or shortness of breath. 6.7 g 0   No facility-administered medications prior to visit.     Per HPI unless specifically indicated in ROS section below Review of Systems  Objective:  BP 124/68    Pulse 78    Temp (!) 97.2 F (36.2 C) (Temporal)    Ht 5' 7.5" (1.715 m)    Wt 202 lb (91.6 kg)    SpO2 98%    BMI 31.17 kg/m   Wt Readings from Last 3 Encounters:  01/31/22 202 lb (91.6 kg)  07/26/21 197 lb (89.4 kg)  07/19/21 200 lb (90.7 kg)      Physical Exam Vitals and nursing note reviewed.  Constitutional:      Appearance: Normal appearance. He is not ill-appearing.  Eyes:  Extraocular Movements: Extraocular movements intact.     Conjunctiva/sclera: Conjunctivae normal.     Pupils: Pupils are equal, round, and reactive to light.  Cardiovascular:     Rate and Rhythm: Normal rate and regular rhythm.     Pulses: Normal pulses.     Heart sounds: Normal heart sounds. No murmur heard. Pulmonary:     Effort: Pulmonary effort is normal. No respiratory distress.     Breath sounds: Normal breath sounds. No wheezing, rhonchi or rales.  Musculoskeletal:     Right lower leg: No edema.     Left lower leg: No edema.     Comments: See HPI for foot exam if done  Skin:    General: Skin is warm and dry.     Findings: No rash.  Neurological:     Mental Status: He is alert.  Psychiatric:        Mood and Affect: Mood normal.         Behavior: Behavior normal.      Results for orders placed or performed in visit on 01/31/22  POCT glycosylated hemoglobin (Hb A1C)  Result Value Ref Range   Hemoglobin A1C 5.8 (A) 4.0 - 5.6 %   HbA1c POC (<> result, manual entry)     HbA1c, POC (prediabetic range)     HbA1c, POC (controlled diabetic range)      Assessment & Plan:  This visit occurred during the SARS-CoV-2 public health emergency.  Safety protocols were in place, including screening questions prior to the visit, additional usage of staff PPE, and extensive cleaning of exam room while observing appropriate contact time as indicated for disinfecting solutions.   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 He notes possible hypoglycemic symptoms in setting of increased activity - discussed need to plan for hypoglycemia when active, provided with 15-15 rule. Advised start monitoring sugars and if notes hypoglycemia <70 unrelated to increased activity, hold metformin XR.  Reassess at CPE.       Relevant Orders   POCT glycosylated hemoglobin (Hb A1C) (Completed)   Renal function panel   Dyslipidemia associated with type 2 diabetes mellitus ([place])    Update FLP after starting atorvastatin.       Relevant Orders   Lipid panel   Renal insufficiency    Update kidney function.         No orders of the defined types were placed in this encounter.  Orders Placed This Encounter  Procedures   Lipid panel   Renal function panel   POCT glycosylated hemoglobin (Hb A1C)     Patient Instructions  You are doing well today.  A1c was wonderful!  Labs today.  Return as needed or in 6 months for physical.   The 15-15 rule for low sugars: If sugar reading below 70, have 15 grams of carbohydrate to raise your blood sugar and  check it after 15 minutes. If its still below 70 mg/dL, have another serving. 15 grams of carbs may be: -Glucose tablets (see instructions) -Gel tube (see instructions) -4 ounces (1/2 cup) of juice or regular soda (not diet) -1 tablespoon of sugar, honey, or corn syrup -Hard candies, jellybeans or gumdrops--see food label for how many to consume  Repeat these steps until your blood sugar is at least 70 mg/dL. Once  your blood sugar is back to normal, eat a meal or snack to make sure it doesnt lower again.    Follow up plan: Return in about 6 months (around 07/31/2022) for annual exam, prior fasting for blood work.  Ria Bush, MD

## 2022-02-01 ENCOUNTER — Other Ambulatory Visit: Payer: Self-pay | Admitting: Family Medicine

## 2022-02-01 MED ORDER — ATORVASTATIN CALCIUM 10 MG PO TABS: 10.0000 mg | ORAL_TABLET | Freq: Every day | ORAL | 1 refills | Status: DC

## 2022-02-01 NOTE — Progress Notes (Signed)
Dropping atorva to 10mg  daily.

## 2022-03-29 DIAGNOSIS — E119 Type 2 diabetes mellitus without complications: Secondary | ICD-10-CM | POA: Diagnosis not present

## 2022-03-29 DIAGNOSIS — H2513 Age-related nuclear cataract, bilateral: Secondary | ICD-10-CM | POA: Diagnosis not present

## 2022-03-29 DIAGNOSIS — H524 Presbyopia: Secondary | ICD-10-CM | POA: Diagnosis not present

## 2022-03-29 LAB — HM DIABETES EYE EXAM

## 2022-05-07 ENCOUNTER — Other Ambulatory Visit: Payer: Self-pay | Admitting: Family Medicine

## 2022-05-16 ENCOUNTER — Ambulatory Visit: Payer: BC Managed Care – PPO | Admitting: Nurse Practitioner

## 2022-05-16 VITALS — BP 122/64 | HR 84 | Temp 97.0°F | Resp 16

## 2022-05-16 DIAGNOSIS — Z20828 Contact with and (suspected) exposure to other viral communicable diseases: Secondary | ICD-10-CM

## 2022-05-16 NOTE — Progress Notes (Signed)
   Subjective:    Patient ID: Jermaine Harris, male    DOB: [birth year], 61 y.o.   MRN: [id number]  HPI  60 year old male presenting to [facility] with concern after being exposed to mono.   His step daughter had mono a few weeks ago, over the past weekend he had chills, headache had a > 100 degree fever which ended last night. He also had diarrhea.  He has not had a fever today but he has taken ibuprofen.   Today's Vitals   05/16/22 1422  BP: 122/64  Pulse: 84  Resp: 16  Temp: (!) 97 F (36.1 C)  TempSrc: Oral  SpO2: 95%   There is no height or weight on file to calculate BMI.   Review of Systems  Constitutional:  Positive for chills and fever.  HENT: Negative.    Eyes: Negative.   Respiratory: Negative.    Cardiovascular: Negative.   Gastrointestinal:  Positive for diarrhea.  Genitourinary: Negative.   Musculoskeletal: Negative.   Neurological: Negative.        Objective:   Physical Exam HENT:     Head: Normocephalic.  Eyes:     Extraocular Movements: Extraocular movements intact.  Cardiovascular:     Heart sounds: Normal heart sounds.  Pulmonary:     Effort: Pulmonary effort is normal.  Musculoskeletal:        General: Normal range of motion.     Cervical back: Normal range of motion.  Lymphadenopathy:     Cervical: Cervical adenopathy present.  Skin:    General: Skin is warm.  Neurological:     Mental Status: He is alert.  Psychiatric:        Mood and Affect: Mood normal.           Assessment & Plan:  1. Exposure to mononucleosis syndrome  - Mononucleosis Test, Qual W/ Reflex    Bland foods, push fluids for diarrhea will follow up with lab results when available RTC as needed

## 2022-05-18 ENCOUNTER — Encounter: Payer: Self-pay | Admitting: Nurse Practitioner

## 2022-05-18 ENCOUNTER — Telehealth: Payer: Self-pay | Admitting: Nurse Practitioner

## 2022-05-18 LAB — MONO QUAL W/RFLX QN: Mono Qual W/Rflx Qn: NEGATIVE

## 2022-05-18 NOTE — Telephone Encounter (Signed)
Left vm mono negative

## 2022-06-03 ENCOUNTER — Other Ambulatory Visit: Payer: Self-pay | Admitting: Family Medicine

## 2022-06-03 NOTE — Telephone Encounter (Signed)
Refill request Ozempic Last office visit 01/31/22 Last refill 05/09/22   3 ml Upcoming appointment 08/02/22.

## 2022-06-06 MED ORDER — OZEMPIC (0.25 OR 0.5 MG/DOSE) 2 MG/3ML ~~LOC~~ SOPN: 0.5000 mg | PEN_INJECTOR | SUBCUTANEOUS | 6 refills | Status: DC

## 2022-07-16 ENCOUNTER — Ambulatory Visit
Admission: EM | Admit: 2022-07-16 | Discharge: 2022-07-16 | Disposition: A | Discharge status: Home / Self Care | Payer: BC Managed Care – PPO | Attending: Emergency Medicine | Admitting: Emergency Medicine

## 2022-07-16 DIAGNOSIS — M5441 Lumbago with sciatica, right side: Secondary | ICD-10-CM

## 2022-07-16 MED ORDER — CYCLOBENZAPRINE HCL 10 MG PO TABS: 10.0000 mg | ORAL_TABLET | Freq: Two times a day (BID) | ORAL | 0 refills | Status: DC | PRN

## 2022-07-16 MED ORDER — PREDNISONE 10 MG (21) PO TBPK: ORAL_TABLET | Freq: Every day | ORAL | 0 refills | Status: DC

## 2022-07-16 NOTE — Discharge Instructions (Addendum)
Take the prednisone as directed.    Take the muscle relaxer as needed for muscle spasm; Do not drive, operate machinery, or drink alcohol with this medication as it can cause drowsiness.   Follow up with your primary care provider or an orthopedist if your symptoms are not improving.     

## 2022-07-16 NOTE — ED Triage Notes (Signed)
Patient to Urgent Care with complaints of lower back pain that radiates into right leg. History of sciatica but states this feels differently. Reports he did not injure himself, states it is positional and worse when laying on his back and laying on his side.

## 2022-07-16 NOTE — ED Provider Notes (Signed)
Jermaine Harris    CSN: [id number] Arrival date & time: 07/16/22  1941      History   Chief Complaint Chief Complaint  Patient presents with   Back Pain    HPI Jermaine Harris is a 61 y.o. male.  Patient presents with right lower back pain which is radiating down his right thigh x2 days.  No falls or injury.  His pain is worse with certain movements.  No fever, chills, abdominal pain, dysuria, hematuria, numbness, weakness, saddle anesthesia, loss of bowel/bladder control, or other symptoms.  Treatment attempted at home with ibuprofen.  His medical history includes hypertension, diastolic heart failure, renal insufficiency, dyslipidemia, seasonal allergies, diabetes, IBS, anxiety, sciatica, lumbar herniated disc and back surgery 2016.  The history is provided by the patient and medical records.    Past Medical History:  Diagnosis Date   Actinic keratosis    Claustrophobia    DM II (diabetes mellitus, type II), controlled (HCC)    GAD (generalized anxiety disorder)    claustrophobia, some panic attacks   HTN (hypertension)    Irritable bowel syndrome    Lumbar herniated disc 06/2015   Seasonal allergies     Patient Active Problem List   Diagnosis Date Noted   Renal insufficiency 74/07/1447   Diastolic CHF ([place]) 18/56/3149   Legionella pneumonia ([place]) 01/04/2021   Dyslipidemia associated with type 2 diabetes mellitus ([place]) 06/27/2020   Polyp of transverse colon    Diverticulosis of large intestine without diverticulitis    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, without long-term current use of insulin ([place]) 11/22/2017   Erectile dysfunction 09/21/2015   Degeneration of lumbar or lumbosacral intervertebral disc 06/30/2015   Left leg numbness 06/23/2015   Health maintenance examination 04/07/2015   Obesity, Class I, BMI 30-34.9 06/23/2014   Essential hypertension    Seasonal allergies    Irritable bowel syndrome    GAD (generalized  anxiety disorder)     Past Surgical History:  Procedure Laterality Date   COLON SURGERY     COLONOSCOPY  2012   mild diverticulosis, rec rpt 5 yrs 2/2 fmhx polyps   COLONOSCOPY WITH PROPOFOL N/A 05/29/2019   TA, diverticulosis, rpt 5 yrs Bonna Gains, Lennette Bihari, MD)   MICRODISCECTOMY LUMBAR  07/2015   L4/5/S1 Trenton Gammon)   TONSILLECTOMY         Home Medications    Prior to Admission medications   Medication Sig Start Date End Date Taking? Authorizing Provider  cyclobenzaprine (FLEXERIL) 10 MG tablet Take 1 tablet (10 mg total) by mouth 2 (two) times daily as needed for muscle spasms. 07/16/22  Yes Sharion Balloon, NP  predniSONE (STERAPRED UNI-PAK 21 TAB) 10 MG (21) TBPK tablet Take by mouth daily. As directed 07/16/22  Yes Sharion Balloon, NP  ALPRAZolam (NIRAVAM) 0.5 MG dissolvable tablet Take 1 tablet (0.5 mg total) by mouth daily as needed for anxiety (plane rides). 07/26/21   Ria Bush, MD  atorvastatin (LIPITOR) 10 MG tablet Take 1 tablet (10 mg total) by mouth daily. 02/01/22   Ria Bush, MD  citalopram (CELEXA) 20 MG tablet Take 1 tablet (20 mg total) by mouth daily. 07/26/21   Ria Bush, MD  gabapentin (NEURONTIN) 300 MG capsule TAKE 1 CAPSULE BY MOUTH EVERYDAY AT BEDTIME 07/26/21   Ria Bush, MD  ibuprofen (ADVIL,MOTRIN) 200 MG tablet Take 200 mg by mouth as needed.    [provider]  metFORMIN (GLUCOPHAGE-XR) 500 MG 24 hr tablet Take 1 tablet (  500 mg total) by mouth daily. 07/26/21   Ria Bush, MD  Semaglutide,0.25 or 0.'5MG'$ /DOS, (OZEMPIC, 0.25 OR 0.5 MG/DOSE,) 2 MG/3ML SOPN Inject 0.5 mg into the skin once a week. 06/06/22   Ria Bush, MD  valsartan-hydrochlorothiazide (DIOVAN-HCT) 160-12.5 MG tablet Take 1 tablet by mouth daily. 07/26/21   Ria Bush, MD    Family History Family History  Problem Relation Age of Onset   Heart disease Father        pacer and CHF   Depression Father    Transient ischemic attack Mother 83        several   Depression Mother    Colon polyps Sister    Sudden death Maternal Grandfather 11       ?mustard gas    Social History Social History   Tobacco Use   Smoking status: Never   Smokeless tobacco: Never  Vaping Use   Vaping Use: Never used  Substance Use Topics   Alcohol use: Yes    Alcohol/week: 0.0 standard drinks of alcohol    Comment: couple of drinks per week   Drug use: Never     Allergies   Ace inhibitors   Review of Systems Review of Systems  Constitutional:  Negative for chills and fever.  Gastrointestinal:  Negative for abdominal pain, constipation, diarrhea, nausea and vomiting.  Genitourinary:  Negative for dysuria, flank pain and hematuria.  Musculoskeletal:  Positive for back pain. Negative for arthralgias and gait problem.  Skin:  Negative for color change, rash and wound.  Neurological:  Negative for weakness and numbness.  All other systems reviewed and are negative.    Physical Exam Triage Vital Signs ED Triage Vitals  Enc Vitals Group     BP 07/16/22 0838 117/77     Pulse Rate 07/16/22 0838 77     Resp 07/16/22 0838 18     Temp 07/16/22 0838 97.9 F (36.6 C)     Temp src --      SpO2 07/16/22 0838 96 %     Weight 07/16/22 0841 200 lb (90.7 kg)     Height 07/16/22 0841 '5\' 8"'$  (1.727 m)     Head Circumference --      Peak Flow --      Pain Score 07/16/22 0840 3     Pain Loc --      Pain Edu? --      Excl. in [place]? --    No data found.  Updated Vital Signs BP 117/77   Pulse 77   Temp 97.9 F (36.6 C)   Resp 18   Ht '5\' 8"'$  (1.727 m)   Wt 200 lb (90.7 kg)   SpO2 96%   BMI 30.41 kg/m   Visual Acuity Right Eye Distance:   Left Eye Distance:   Bilateral Distance:    Right Eye Near:   Left Eye Near:    Bilateral Near:     Physical Exam Vitals and nursing note reviewed.  Constitutional:      General: He is not in acute distress.    Appearance: Normal appearance. He is well-developed. He is not ill-appearing.  HENT:      Mouth/Throat:     Mouth: Mucous membranes are moist.  Cardiovascular:     Rate and Rhythm: Normal rate and regular rhythm.     Heart sounds: Normal heart sounds.  Pulmonary:     Effort: Pulmonary effort is normal. No respiratory distress.     Breath sounds: Normal breath  sounds.  Abdominal:     General: Bowel sounds are normal.     Palpations: Abdomen is soft.     Tenderness: There is no abdominal tenderness. There is no right CVA tenderness, left CVA tenderness, guarding or rebound.  Musculoskeletal:        General: No swelling, tenderness, deformity or signs of injury. Normal range of motion.     Cervical back: Neck supple.  Skin:    General: Skin is warm and dry.     Capillary Refill: Capillary refill takes less than 2 seconds.     Findings: No bruising, erythema, lesion or rash.  Neurological:     General: No focal deficit present.     Mental Status: He is alert and oriented to person, place, and time.     Sensory: No sensory deficit.     Motor: No weakness.     Gait: Gait normal.  Psychiatric:        Mood and Affect: Mood normal.        Behavior: Behavior normal.      UC Treatments / Results  Labs (all labs ordered are listed, but only abnormal results are displayed) Labs Reviewed - No data to display  EKG   Radiology No results found.  Procedures Procedures (including critical care time)  Medications Ordered in UC Medications - No data to display  Initial Impression / Assessment and Plan / UC Course  I have reviewed the triage vital signs and the nursing notes.  Pertinent labs & imaging results that were available during my care of the patient were reviewed by me and considered in my medical decision making (see chart for details).   Right lower back pain with right-sided sciatica.  Patient has been taking ibuprofen without relief.  He has history of sciatica and states prednisone usually works best for him.  He has history of lumbar disc surgery in 2016  and has not seen a neurosurgeon since then.  Treating today with prednisone taper and cyclobenzaprine.  Precautions for drowsiness with cyclobenzaprine discussed.  Education provided on sciatica.  Instructed patient to follow-up with his PCP or an orthopedist or neurosurgeon if his symptoms are not improving.  He agrees to plan of care.   Final Clinical Impressions(s) / UC Diagnoses   Final diagnoses:  Right-sided low back pain with right-sided sciatica, unspecified chronicity     Discharge Instructions      Take the prednisone as directed.   Take the muscle relaxer as needed for muscle spasm; Do not drive, operate machinery, or drink alcohol with this medication as it can cause drowsiness.   Follow up with your primary care provider or an orthopedist if your symptoms are not improving.         ED Prescriptions     Medication Sig Dispense Auth. Provider   predniSONE (STERAPRED UNI-PAK 21 TAB) 10 MG (21) TBPK tablet Take by mouth daily. As directed 21 tablet Sharion Balloon, NP   cyclobenzaprine (FLEXERIL) 10 MG tablet Take 1 tablet (10 mg total) by mouth 2 (two) times daily as needed for muscle spasms. 20 tablet Sharion Balloon, NP      I have reviewed the PDMP during this encounter.   Sharion Balloon, NP 07/16/22 [phone]

## 2022-07-18 ENCOUNTER — Other Ambulatory Visit: Payer: Self-pay | Admitting: Family Medicine

## 2022-07-18 DIAGNOSIS — E1169 Type 2 diabetes mellitus with other specified complication: Secondary | ICD-10-CM

## 2022-07-18 DIAGNOSIS — Z125 Encounter for screening for malignant neoplasm of prostate: Secondary | ICD-10-CM

## 2022-07-18 DIAGNOSIS — E1136 Type 2 diabetes mellitus with diabetic cataract: Secondary | ICD-10-CM

## 2022-07-20 ENCOUNTER — Other Ambulatory Visit: Payer: Self-pay | Admitting: Family Medicine

## 2022-07-20 NOTE — Telephone Encounter (Signed)
Patient went to [facility] and was prescibed predniSONE (STERAPRED UNI-PAK 21 TAB) 10 MG (21) TBPK tablet. Patient was wanting to know if Dr. Darnell Level could send in a refill request for him for the prednisone. Stated he just need enough to get him to his appointment with Dr.G because he will be out of town in [place] next week. Please advise. Thank you!

## 2022-07-21 NOTE — Telephone Encounter (Signed)
Prednisone Last rx:  07/16/22, #21 Last OV:  01/31/22, 6 mo DM f/u Next OV:  08/02/22, CPE

## 2022-07-22 ENCOUNTER — Other Ambulatory Visit (INDEPENDENT_AMBULATORY_CARE_PROVIDER_SITE_OTHER): Payer: BC Managed Care – PPO

## 2022-07-22 ENCOUNTER — Encounter: Payer: Self-pay | Admitting: Family Medicine

## 2022-07-22 DIAGNOSIS — E1136 Type 2 diabetes mellitus with diabetic cataract: Secondary | ICD-10-CM

## 2022-07-22 DIAGNOSIS — Z125 Encounter for screening for malignant neoplasm of prostate: Secondary | ICD-10-CM | POA: Diagnosis not present

## 2022-07-22 DIAGNOSIS — E1169 Type 2 diabetes mellitus with other specified complication: Secondary | ICD-10-CM

## 2022-07-22 DIAGNOSIS — E785 Hyperlipidemia, unspecified: Secondary | ICD-10-CM

## 2022-07-22 LAB — PSA: PSA: 0.46 ng/mL (ref 0.10–4.00)

## 2022-07-22 LAB — COMPREHENSIVE METABOLIC PANEL
ALT: 33 U/L (ref 0–53)
AST: 16 U/L (ref 0–37)
Albumin: 4.2 g/dL (ref 3.5–5.2)
Alkaline Phosphatase: 59 U/L (ref 39–117)
BUN: 42 mg/dL — ABNORMAL HIGH (ref 6–23)
CO2: 25 mEq/L (ref 19–32)
Calcium: 8.9 mg/dL (ref 8.4–10.5)
Chloride: 102 mEq/L (ref 96–112)
Creatinine, Ser: 1.35 mg/dL (ref 0.40–1.50)
GFR: 56.34 mL/min — ABNORMAL LOW (ref >60.00)
Glucose, Bld: 122 mg/dL — ABNORMAL HIGH (ref 70–99)
Potassium: 4.1 mEq/L (ref 3.5–5.1)
Sodium: 139 mEq/L (ref 135–145)
Total Bilirubin: 0.4 mg/dL (ref 0.2–1.2)
Total Protein: 6.4 g/dL (ref 6.0–8.3)

## 2022-07-22 LAB — LIPID PANEL
Cholesterol: 90 mg/dL (ref 0–200)
HDL: 35.7 mg/dL — ABNORMAL LOW (ref >39.00)
NonHDL: 54.02
Total CHOL/HDL Ratio: 3
Triglycerides: 228 mg/dL — ABNORMAL HIGH (ref 0.0–149.0)
VLDL: 45.6 mg/dL — ABNORMAL HIGH (ref 0.0–40.0)

## 2022-07-22 LAB — MICROALBUMIN / CREATININE URINE RATIO
Creatinine,U: 161.1 mg/dL
Microalb Creat Ratio: 0.5 mg/g (ref 0.0–30.0)
Microalb, Ur: 0.8 mg/dL (ref 0.0–1.9)

## 2022-07-22 LAB — LDL CHOLESTEROL, DIRECT: Direct LDL: 32 mg/dL

## 2022-07-22 LAB — HEMOGLOBIN A1C: Hgb A1c MFr Bld: 6.7 % — ABNORMAL HIGH (ref 4.6–6.5)

## 2022-07-22 MED ORDER — PREDNISONE 10 MG (21) PO TBPK: ORAL_TABLET | Freq: Every day | ORAL | 0 refills | Status: DC

## 2022-07-22 NOTE — Telephone Encounter (Signed)
ERx 

## 2022-08-01 ENCOUNTER — Other Ambulatory Visit: Payer: Self-pay | Admitting: Family Medicine

## 2022-08-02 ENCOUNTER — Ambulatory Visit (INDEPENDENT_AMBULATORY_CARE_PROVIDER_SITE_OTHER): Payer: BC Managed Care – PPO | Admitting: Family Medicine

## 2022-08-02 ENCOUNTER — Encounter: Payer: Self-pay | Admitting: Family Medicine

## 2022-08-02 VITALS — BP 134/82 | HR 71 | Temp 97.7°F | Ht 67.25 in | Wt 204.4 lb

## 2022-08-02 DIAGNOSIS — E669 Obesity, unspecified: Secondary | ICD-10-CM

## 2022-08-02 DIAGNOSIS — R109 Unspecified abdominal pain: Secondary | ICD-10-CM | POA: Insufficient documentation

## 2022-08-02 DIAGNOSIS — I10 Essential (primary) hypertension: Secondary | ICD-10-CM | POA: Diagnosis not present

## 2022-08-02 DIAGNOSIS — E1136 Type 2 diabetes mellitus with diabetic cataract: Secondary | ICD-10-CM

## 2022-08-02 DIAGNOSIS — F411 Generalized anxiety disorder: Secondary | ICD-10-CM | POA: Diagnosis not present

## 2022-08-02 DIAGNOSIS — E1169 Type 2 diabetes mellitus with other specified complication: Secondary | ICD-10-CM

## 2022-08-02 DIAGNOSIS — Z0000 Encounter for general adult medical examination without abnormal findings: Secondary | ICD-10-CM

## 2022-08-02 DIAGNOSIS — E66811 Obesity, class 1: Secondary | ICD-10-CM

## 2022-08-02 DIAGNOSIS — E785 Hyperlipidemia, unspecified: Secondary | ICD-10-CM

## 2022-08-02 DIAGNOSIS — R10A1 Flank pain, right side: Secondary | ICD-10-CM

## 2022-08-02 LAB — POC URINALSYSI DIPSTICK (AUTOMATED)
Bilirubin, UA: NEGATIVE
Blood, UA: NEGATIVE
Glucose, UA: NEGATIVE
Ketones, UA: NEGATIVE
Leukocytes, UA: NEGATIVE
Nitrite, UA: NEGATIVE
Protein, UA: NEGATIVE
Spec Grav, UA: >=1.03 — AB (ref 1.010–1.025)
Urobilinogen, UA: 0.2 E.U./dL
pH, UA: 6 (ref 5.0–8.0)

## 2022-08-02 MED ORDER — VALSARTAN-HYDROCHLOROTHIAZIDE 160-12.5 MG PO TABS
1.0000 | ORAL_TABLET | Freq: Every day | ORAL | 3 refills | Status: DC
Start: 2022-08-02 — End: 2023-08-09

## 2022-08-02 MED ORDER — METFORMIN HCL ER 500 MG PO TB24: 500.0000 mg | ORAL_TABLET | Freq: Every day | ORAL | 3 refills | Status: DC

## 2022-08-02 MED ORDER — GABAPENTIN 300 MG PO CAPS
ORAL_CAPSULE | ORAL | 3 refills | Status: DC
Start: 2022-08-02 — End: 2023-08-09

## 2022-08-02 MED ORDER — CITALOPRAM HYDROBROMIDE 20 MG PO TABS
20.0000 mg | ORAL_TABLET | Freq: Every day | ORAL | 3 refills | Status: DC
Start: 2022-08-02 — End: 2023-03-06

## 2022-08-02 MED ORDER — ATORVASTATIN CALCIUM 10 MG PO TABS: 10.0000 mg | ORAL_TABLET | Freq: Every day | ORAL | 3 refills | Status: DC

## 2022-08-02 NOTE — Assessment & Plan Note (Addendum)
Chronic LDL well controlled on low dose atorvastatin. Trig elevation presumed from steroid induced hyperglycemia.  The ASCVD Risk score (Arnett DK, et al., 2019) failed to calculate for the following reasons:   The valid total cholesterol range is 130 to 320 mg/dL

## 2022-08-02 NOTE — Patient Instructions (Addendum)
Sugar elevation, triglyceride elevation likely from prednisone courses recently.  Limit sugar/carbs while on prednisone.  Good to see you today Return as needed or in 3-4 months for diabetes follow up visit.   For lower back pain - urinalysis today. Do gentle stretches regularly. May try heating pad as needed.   Health Maintenance, Male Adopting a healthy lifestyle and getting preventive care are important in promoting health and wellness. Ask your health care provider about: The right schedule for you to have regular tests and exams. Things you can do on your own to prevent diseases and keep yourself healthy. What should I know about diet, weight, and exercise? Eat a healthy diet  Eat a diet that includes plenty of vegetables, fruits, low-fat dairy products, and lean protein. Do not eat a lot of foods that are high in solid fats, added sugars, or sodium. Maintain a healthy weight Body mass index (BMI) is a measurement that can be used to identify possible weight problems. It estimates body fat based on height and weight. Your health care provider can help determine your BMI and help you achieve or maintain a healthy weight. Get regular exercise Get regular exercise. This is one of the most important things you can do for your health. Most adults should: Exercise for at least 150 minutes each week. The exercise should increase your heart rate and make you sweat (moderate-intensity exercise). Do strengthening exercises at least twice a week. This is in addition to the moderate-intensity exercise. Spend less time sitting. Even light physical activity can be beneficial. Watch cholesterol and blood lipids Have your blood tested for lipids and cholesterol at 62 years of age, then have this test every 5 years. You may need to have your cholesterol levels checked more often if: Your lipid or cholesterol levels are high. You are older than 62 years of age. You are at high risk for heart  disease. What should I know about cancer screening? Many types of cancers can be detected early and may often be prevented. Depending on your health history and family history, you may need to have cancer screening at various ages. This may include screening for: Colorectal cancer. Prostate cancer. Skin cancer. Lung cancer. What should I know about heart disease, diabetes, and high blood pressure? Blood pressure and heart disease High blood pressure causes heart disease and increases the risk of stroke. This is more likely to develop in people who have high blood pressure readings or are overweight. Talk with your health care provider about your target blood pressure readings. Have your blood pressure checked: Every 3-5 years if you are 62-59 years of age. Every year if you are 62 years old or older. If you are between the ages of 11 and 32 and are a current or former smoker, ask your health care provider if you should have a one-time screening for abdominal aortic aneurysm (AAA). Diabetes Have regular diabetes screenings. This checks your fasting blood sugar level. Have the screening done: Once every three years after age 33 if you are at a normal weight and have a low risk for diabetes. More often and at a younger age if you are overweight or have a high risk for diabetes. What should I know about preventing infection? Hepatitis B If you have a higher risk for hepatitis B, you should be screened for this virus. Talk with your health care provider to find out if you are at risk for hepatitis B infection. Hepatitis C Blood testing is recommended  for: Everyone born from [birth year] through 1966. Anyone with known risk factors for hepatitis C. Sexually transmitted infections (STIs) You should be screened each year for STIs, including gonorrhea and chlamydia, if: You are sexually active and are younger than 62 years of age. You are older than 62 years of age and your health care provider tells you  that you are at risk for this type of infection. Your sexual activity has changed since you were last screened, and you are at increased risk for chlamydia or gonorrhea. Ask your health care provider if you are at risk. Ask your health care provider about whether you are at high risk for HIV. Your health care provider may recommend a prescription medicine to help prevent HIV infection. If you choose to take medicine to prevent HIV, you should first get tested for HIV. You should then be tested every 3 months for as long as you are taking the medicine. Follow these instructions at home: Alcohol use Do not drink alcohol if your health care provider tells you not to drink. If you drink alcohol: Limit how much you have to 0-2 drinks a day. Know how much alcohol is in your drink. In the [place] one drink equals one 12 oz bottle of beer (355 mL), one 5 oz glass of wine (148 mL), or one 1 oz glass of hard liquor (44 mL). Lifestyle Do not use any products that contain nicotine or tobacco. These products include cigarettes, chewing tobacco, and vaping devices, such as e-cigarettes. If you need help quitting, ask your health care provider. Do not use street drugs. Do not share needles. Ask your health care provider for help if you need support or information about quitting drugs. General instructions Schedule regular health, dental, and eye exams. Stay current with your vaccines. Tell your health care provider if: You often feel depressed. You have ever been abused or do not feel safe at home. Summary Adopting a healthy lifestyle and getting preventive care are important in promoting health and wellness. Follow your health care provider's instructions about healthy diet, exercising, and getting tested or screened for diseases. Follow your health care provider's instructions on monitoring your cholesterol and blood pressure. This information is not intended to replace advice given to you by your health  care provider. Make sure you discuss any questions you have with your health care provider. Document Revised: 04/12/2021 Document Reviewed: 04/12/2021 Elsevier Patient Education  [place].

## 2022-08-02 NOTE — Assessment & Plan Note (Signed)
Chronic, well controlled on current regimen - continue.  Anticipate deterioration due to recent prednisone tapers.  Reassess at 3-4 mo DM f/u visit.

## 2022-08-02 NOTE — Assessment & Plan Note (Signed)
Chronic, stable on current regimen.  

## 2022-08-02 NOTE — Progress Notes (Signed)
Patient ID: Jermaine Harris, male    DOB: 02-12-1961, 61 y.o.   MRN: [id number]  This visit was conducted in person.  BP 134/82   Pulse 71   Temp 97.7 F (36.5 C) (Temporal)   Ht 5' 7.25" (1.708 m)   Wt 204 lb 6 oz (92.7 kg)   SpO2 97%   BMI 31.77 kg/m    CC: CPE Subjective:   HPI: Jermaine Harris is a 61 y.o. male presenting on 08/02/2022 for Annual Exam   Gabapentin effective for neuropathic pain. Persistent L leg numbness after herniated disc s/p microdiscectomy L4/5/S1 (2016).   Recent prednisone course prescribed by [facility] mid 07/2022 for back pain. Also prescribed flexeril. Ibuprofen was ineffective. First prednisone taper helped some. Currently on second 20m prednisone taper of steroids. Describes pain to left upper lumbar back, started while driving 3 wks ago. Denies inciting trauma/injury or falls. No radiculopathy, numbness, weakness of legs. No fevers/chills. Mainly bothers him at night when sleeping on his left side or back. He has been using TENS unit at home.   H/o legionella pneumonia.    Preventative: COLONOSCOPY WITH PROPOFOL 05/29/2019 - TA, diverticulosis, rpt 5 yrs (Tahiliani, VLennette Bihari MD)  Prostate cancer screening - yearly screening. Nocturia x1, some urinary urgency.  Lung cancer screening - not eligible  Flu shot yearly COVID vaccine [place]2, booster x2 09/2020, 03/2021 Pneumovax 2019 Td 2010, Tdap 12/2018 Shingrix - 06/2020, 02/2021 Got MMR at school this fall.  Seat belt use discussed Sunscreen use discussed, no changing moles on skin Sleep - averaging 7 hours/night Non smoker  Alcohol - 1 glass wine a few times a week  Dentist q6 mo  Eye exam yearly    Divorced (2015) Remarried (2017) Occupation: EParamedic(Leisure centre manager Edu: PhD  Activity: golf, walking, yardwork Diet: good water, vegetables daily (salads)      Relevant past medical, surgical, family and social history reviewed and updated as indicated. Interim medical  history since our last visit reviewed. Allergies and medications reviewed and updated. Outpatient Medications Prior to Visit  Medication Sig Dispense Refill   ALPRAZolam (NIRAVAM) 0.5 MG dissolvable tablet Take 1 tablet (0.5 mg total) by mouth daily as needed for anxiety (plane rides). 15 tablet 0   cyclobenzaprine (FLEXERIL) 10 MG tablet Take 1 tablet (10 mg total) by mouth 2 (two) times daily as needed for muscle spasms. 20 tablet 0   ibuprofen (ADVIL,MOTRIN) 200 MG tablet Take 200 mg by mouth as needed.     predniSONE (STERAPRED UNI-PAK 21 TAB) 10 MG (21) TBPK tablet Take by mouth daily. As directed 21 tablet 0   Semaglutide,0.25 or 0.5MG/DOS, (OZEMPIC, 0.25 OR 0.5 MG/DOSE,) 2 MG/3ML SOPN Inject 0.5 mg into the skin once a week. 3 mL 6   atorvastatin (LIPITOR) 10 MG tablet Take 1 tablet (10 mg total) by mouth daily. 90 tablet 1   citalopram (CELEXA) 20 MG tablet Take 1 tablet (20 mg total) by mouth daily. 90 tablet 3   gabapentin (NEURONTIN) 300 MG capsule TAKE 1 CAPSULE BY MOUTH EVERYDAY AT BEDTIME 90 capsule 3   metFORMIN (GLUCOPHAGE-XR) 500 MG 24 hr tablet Take 1 tablet (500 mg total) by mouth daily. 90 tablet 3   valsartan-hydrochlorothiazide (DIOVAN-HCT) 160-12.5 MG tablet Take 1 tablet by mouth daily. 90 tablet 3   No facility-administered medications prior to visit.     Per HPI unless specifically indicated in ROS section below Review of Systems  Constitutional:  Negative for  activity change, appetite change, chills, fatigue, fever and unexpected weight change.  HENT:  Negative for hearing loss.   Eyes:  Negative for visual disturbance.  Respiratory:  Negative for cough, chest tightness, shortness of breath and wheezing.   Cardiovascular:  Negative for chest pain, palpitations and leg swelling.  Gastrointestinal:  Positive for diarrhea (occ loose stools). Negative for abdominal distention, abdominal pain, blood in stool, constipation, nausea and vomiting.  Genitourinary:  Negative  for difficulty urinating and hematuria.  Musculoskeletal:  Negative for arthralgias, myalgias and neck pain.  Skin:  Negative for rash.  Neurological:  Negative for dizziness, seizures, syncope and headaches.  Hematological:  Negative for adenopathy. Does not bruise/bleed easily.  Psychiatric/Behavioral:  Negative for dysphoric mood. The patient is not nervous/anxious.     Objective:  BP 134/82   Pulse 71   Temp 97.7 F (36.5 C) (Temporal)   Ht 5' 7.25" (1.708 m)   Wt 204 lb 6 oz (92.7 kg)   SpO2 97%   BMI 31.77 kg/m   Wt Readings from Last 3 Encounters:  08/02/22 204 lb 6 oz (92.7 kg)  07/16/22 200 lb (90.7 kg)  01/31/22 202 lb (91.6 kg)      Physical Exam Vitals and nursing note reviewed.  Constitutional:      General: He is not in acute distress.    Appearance: Normal appearance. He is well-developed. He is not ill-appearing.  HENT:     Head: Normocephalic and atraumatic.     Right Ear: Hearing, tympanic membrane, ear canal and external ear normal.     Left Ear: Hearing, tympanic membrane, ear canal and external ear normal.  Eyes:     General: No scleral icterus.    Extraocular Movements: Extraocular movements intact.     Conjunctiva/sclera: Conjunctivae normal.     Pupils: Pupils are equal, round, and reactive to light.  Neck:     Thyroid: No thyroid mass or thyromegaly.  Cardiovascular:     Rate and Rhythm: Normal rate and regular rhythm.     Pulses: Normal pulses.          Radial pulses are 2+ on the right side and 2+ on the left side.     Heart sounds: Normal heart sounds. No murmur heard. Pulmonary:     Effort: Pulmonary effort is normal. No respiratory distress.     Breath sounds: Normal breath sounds. No wheezing, rhonchi or rales.  Abdominal:     General: Bowel sounds are normal. There is no distension.     Palpations: Abdomen is soft. There is no mass.     Tenderness: There is no abdominal tenderness. There is no right CVA tenderness, left CVA  tenderness, guarding or rebound.     Hernia: No hernia is present.  Genitourinary:    Penis: Normal.   Musculoskeletal:        General: Normal range of motion.     Cervical back: Normal range of motion and neck supple.     Right lower leg: No edema.     Left lower leg: No edema.     Comments:  No pain midline spine No significant paraspinous mm tenderness No reproducible R flank pain  Neg SLR on right. No pain with int/ext rotation at hip. Neg FABER  Lymphadenopathy:     Cervical: No cervical adenopathy.  Skin:    General: Skin is warm and dry.     Findings: No rash.  Neurological:     General: No focal deficit  present.     Mental Status: He is alert and oriented to person, place, and time.  Psychiatric:        Mood and Affect: Mood normal.        Behavior: Behavior normal.        Thought Content: Thought content normal.        Judgment: Judgment normal.       Results for orders placed or performed in visit on 08/02/22  POCT Urinalysis Dipstick (Automated)  Result Value Ref Range   Color, UA dark yellow    Clarity, UA cler    Glucose, UA Negative Negative   Bilirubin, UA negative    Ketones, UA negative    Spec Grav, UA >=1.030 (A) 1.010 - 1.025   Blood, UA negative    pH, UA 6.0 5.0 - 8.0   Protein, UA Negative Negative   Urobilinogen, UA 0.2 0.2 or 1.0 E.U./dL   Nitrite, UA negative    Leukocytes, UA Negative Negative    Assessment & Plan:   Problem List Items Addressed This Visit     Health maintenance examination - Primary (Chronic)    Preventative protocols reviewed and updated unless pt declined. Discussed healthy diet and lifestyle.       Essential hypertension    Chronic, stable on current regimen.       Relevant Medications   atorvastatin (LIPITOR) 10 MG tablet   valsartan-hydrochlorothiazide (DIOVAN-HCT) 160-12.5 MG tablet   GAD (generalized anxiety disorder)    Stable period on celexa.       Relevant Medications   citalopram (CELEXA) 20  MG tablet   Obesity, Class I, BMI 30-34.9    Encourage healthy diet and lifestyle choices to affect sustainable weight loss.       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  Anticipate deterioration due to recent prednisone tapers.  Reassess at 3-4 mo DM f/u visit.       Relevant Medications   atorvastatin (LIPITOR) 10 MG tablet   metFORMIN (GLUCOPHAGE-XR) 500 MG 24 hr tablet   valsartan-hydrochlorothiazide (DIOVAN-HCT) 160-12.5 MG tablet   Dyslipidemia associated with type 2 diabetes mellitus (HCC)    Chronic LDL well controlled on low dose atorvastatin. Trig elevation presumed from steroid induced hyperglycemia.  The ASCVD Risk score (Arnett DK, et al., 2019) failed to calculate for the following reasons:   The valid total cholesterol range is 130 to 320 mg/dL      Relevant Medications   atorvastatin (LIPITOR) 10 MG tablet   metFORMIN (GLUCOPHAGE-XR) 500 MG 24 hr tablet   valsartan-hydrochlorothiazide (DIOVAN-HCT) 160-12.5 MG tablet   Right flank pain    No reproducible discomfort. Not quite consistent with spinal etiology. No signs of skin rash to suggest shingles.  Check UA today.  If ongoing pain, consider abd [place].       Relevant Orders   POCT Urinalysis Dipstick (Automated) (Completed)     Meds ordered this encounter  Medications   atorvastatin (LIPITOR) 10 MG tablet    Sig: Take 1 tablet (10 mg total) by mouth daily.    Dispense:  90 tablet    Refill:  3   citalopram (CELEXA) 20 MG tablet    Sig: Take 1 tablet (20 mg total) by mouth daily.    Dispense:  90 tablet    Refill:  3   gabapentin (NEURONTIN) 300 MG capsule  Sig: TAKE 1 CAPSULE BY MOUTH EVERYDAY AT BEDTIME    Dispense:  90 capsule    Refill:  3   metFORMIN (GLUCOPHAGE-XR) 500 MG 24 hr tablet    Sig: Take 1 tablet (500 mg total) by mouth daily.    Dispense:  90 tablet     Refill:  3   valsartan-hydrochlorothiazide (DIOVAN-HCT) 160-12.5 MG tablet    Sig: Take 1 tablet by mouth daily.    Dispense:  90 tablet    Refill:  3   Orders Placed This Encounter  Procedures   POCT Urinalysis Dipstick (Automated)    Patient instructions: Sugar elevation, triglyceride elevation likely from prednisone courses recently.  Limit sugar/carbs while on prednisone.  Good to see you today Return as needed or in 3-4 months for diabetes follow up visit.   For lower back pain - urinalysis today. Do gentle stretches regularly. May try heating pad as needed.   Follow up plan: Return in about 3 months (around 11/02/2022), or if symptoms worsen or fail to improve, for follow up visit.  Ria Bush, MD

## 2022-08-02 NOTE — Assessment & Plan Note (Signed)
Preventative protocols reviewed and updated unless pt declined. Discussed healthy diet and lifestyle.  

## 2022-08-02 NOTE — Assessment & Plan Note (Signed)
No reproducible discomfort. Not quite consistent with spinal etiology. No signs of skin rash to suggest shingles.  Check UA today.  If ongoing pain, consider abd [place].

## 2022-08-02 NOTE — Assessment & Plan Note (Signed)
Encourage healthy diet and lifestyle choices to affect sustainable weight loss.  

## 2022-08-02 NOTE — Assessment & Plan Note (Signed)
Stable period on celexa.

## 2022-08-04 ENCOUNTER — Encounter: Payer: Self-pay | Admitting: Family Medicine

## 2022-08-04 DIAGNOSIS — R109 Unspecified abdominal pain: Secondary | ICD-10-CM

## 2022-08-04 DIAGNOSIS — N289 Disorder of kidney and ureter, unspecified: Secondary | ICD-10-CM

## 2022-08-11 ENCOUNTER — Ambulatory Visit
Admission: RE | Admit: 2022-08-11 | Discharge: 2022-08-11 | Disposition: A | Discharge status: Home / Self Care | Payer: BC Managed Care – PPO | Source: Ambulatory Visit | Attending: Family Medicine | Admitting: Family Medicine

## 2022-08-11 ENCOUNTER — Encounter: Payer: Self-pay | Admitting: Family Medicine

## 2022-08-11 DIAGNOSIS — R109 Unspecified abdominal pain: Secondary | ICD-10-CM

## 2022-08-11 DIAGNOSIS — N289 Disorder of kidney and ureter, unspecified: Secondary | ICD-10-CM

## 2022-08-11 DIAGNOSIS — K760 Fatty (change of) liver, not elsewhere classified: Secondary | ICD-10-CM | POA: Diagnosis not present

## 2022-08-15 DIAGNOSIS — M545 Low back pain, unspecified: Secondary | ICD-10-CM | POA: Diagnosis not present

## 2022-09-15 IMAGING — CT CT ANGIO CHEST
2 of 6 series · 18 of 46 positions shown · IV contrast (APPLIED)
Comparison: Chest x-ray from earlier in the same day.

CLINICAL DATA: Shortness of breath

EXAM:
CT ANGIOGRAPHY CHEST WITH CONTRAST
TECHNIQUE: Multidetector CT imaging of the chest was performed using the
standard protocol during bolus administration of intravenous
contrast. Multiplanar CT image reconstructions and MIPs were
obtained to evaluate the vascular anatomy.
CONTRAST:  75mL OMNIPAQUE IOHEXOL 350 MG/ML SOLN

[Series 5: thins · axial · 0.72mm/px · z∈[+1156,+1408]mm · 15 of 277 slices shown]
[im 13/277  lung]
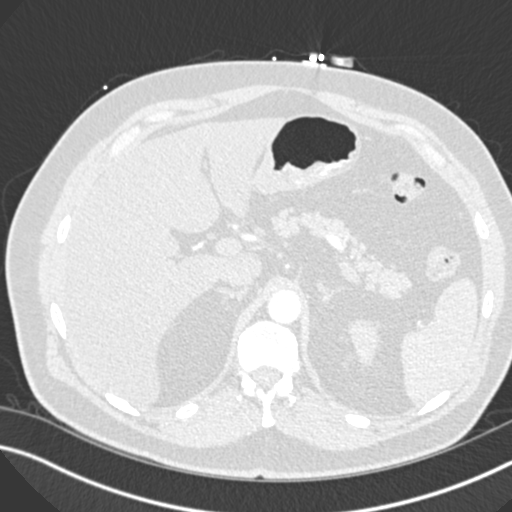
[im 37/277  soft-tissue]
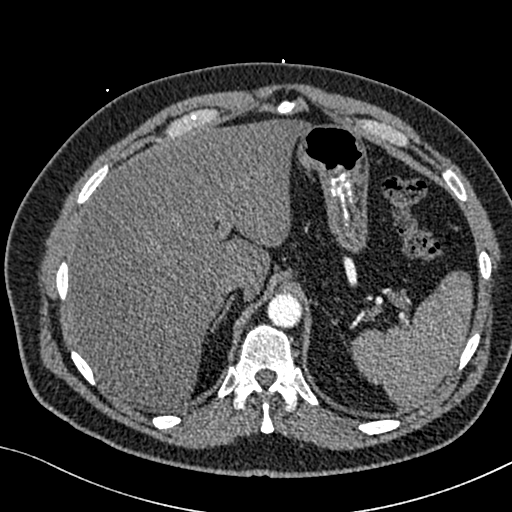
[im 49/277  lung]
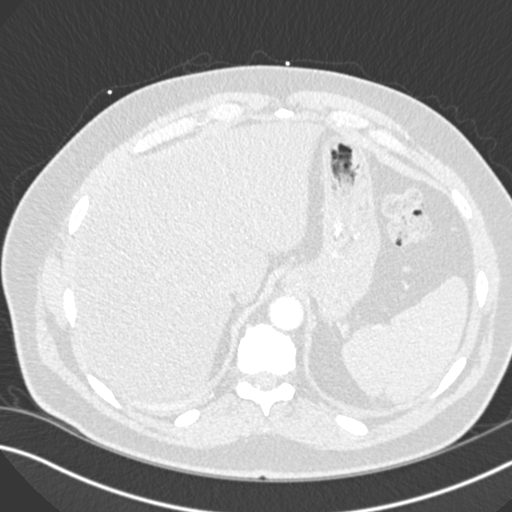
[im 73/277  soft-tissue]
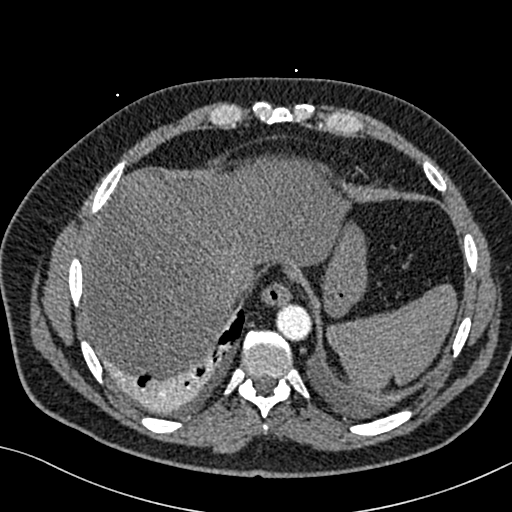
[im 85/277  lung]
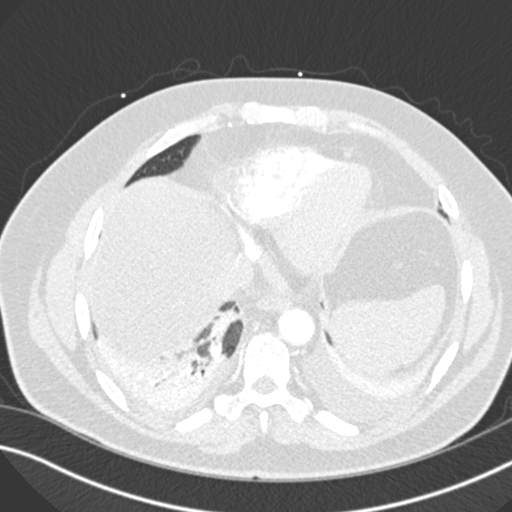
[im 109/277  soft-tissue]
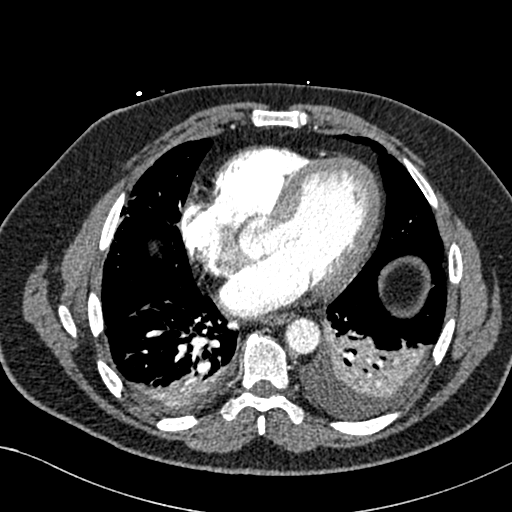
[im 121/277  lung]
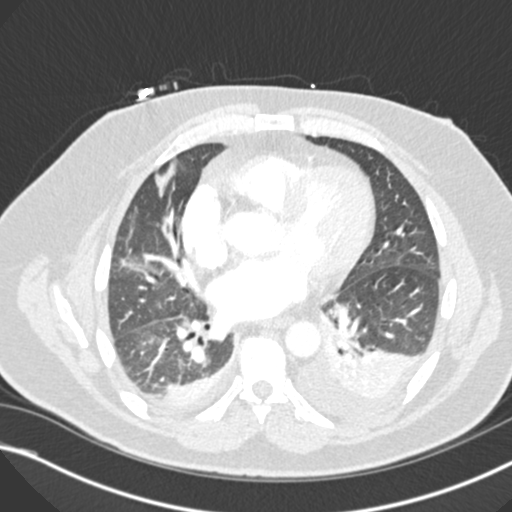
[im 145/277  soft-tissue]
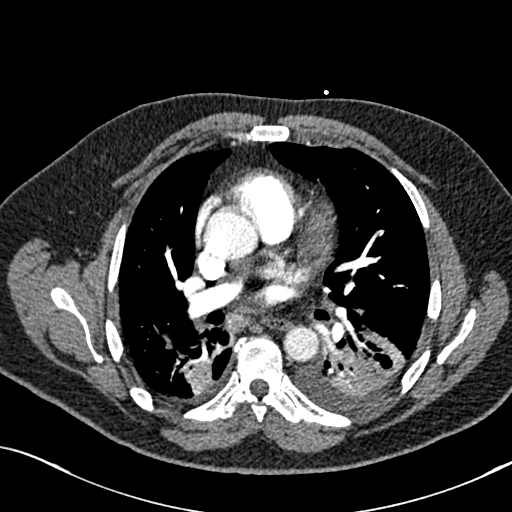
[im 157/277  lung]
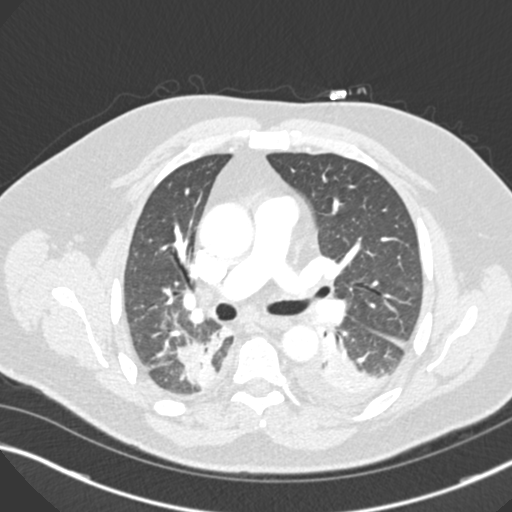
[im 169/277  soft-tissue]
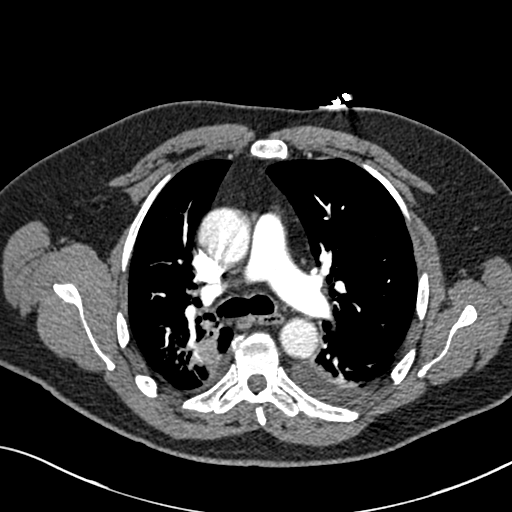
[im 193/277  lung]
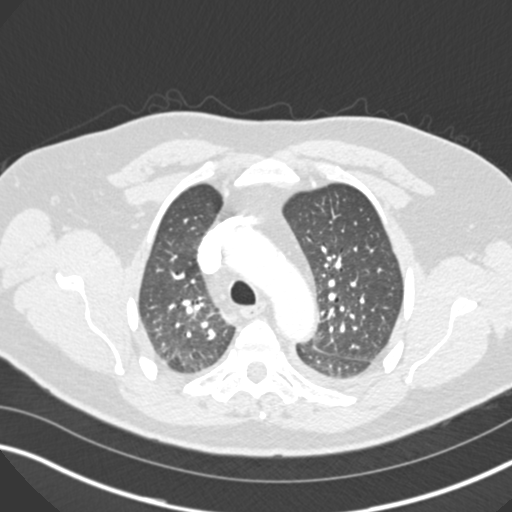
[im 205/277  soft-tissue]
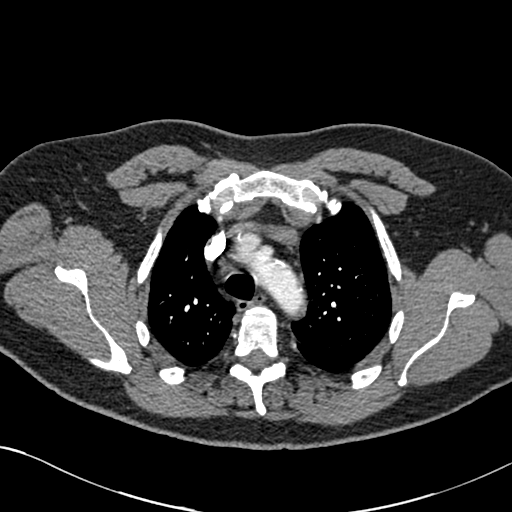
[im 229/277  lung]
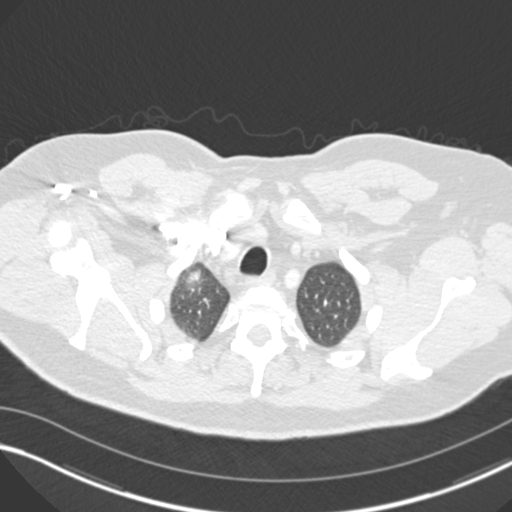
[im 241/277  soft-tissue]
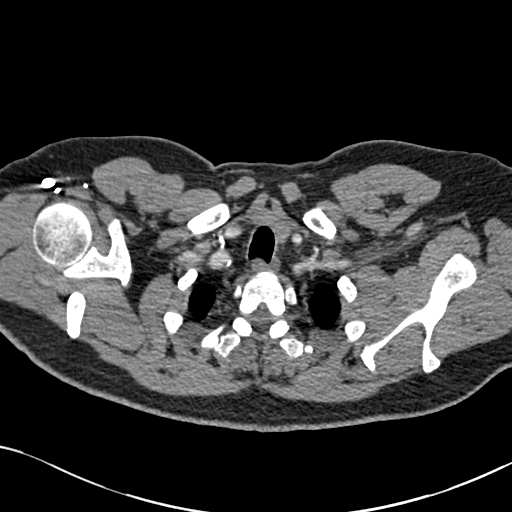
[im 265/277  lung]
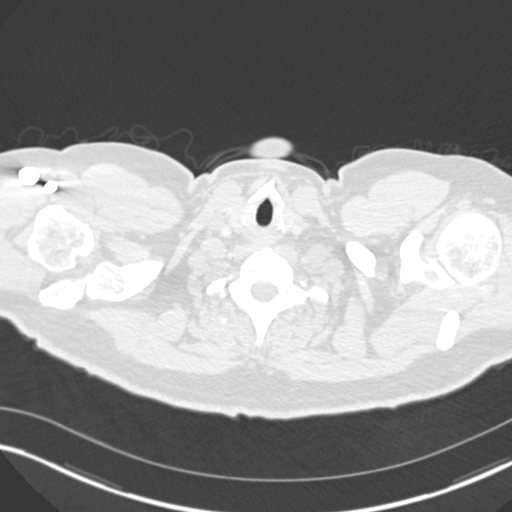

[Series 7: coronal mpr · coronal · 0.57mm/px · 3 of 95 slices shown]
[im 24/95  soft-tissue]
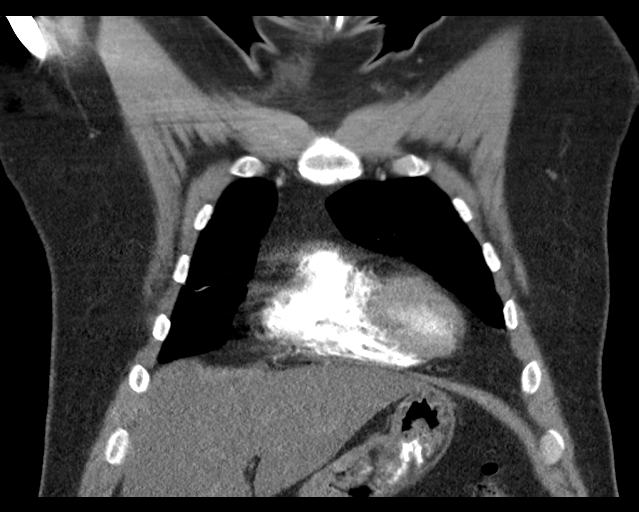
[im 48/95  soft-tissue]
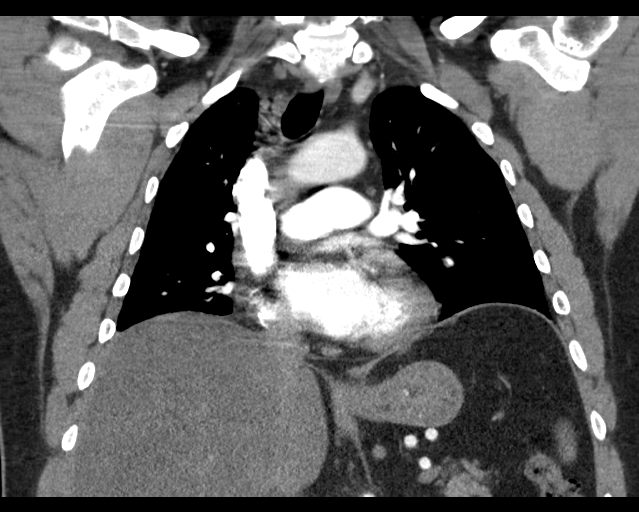
[im 71/95  soft-tissue]
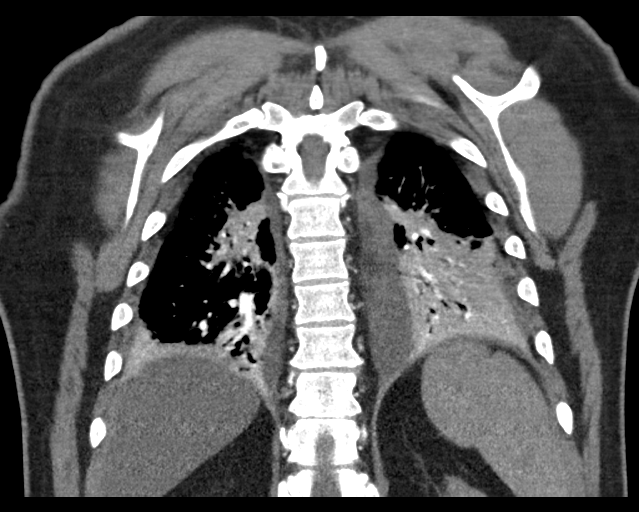

[18 of 46 positions shown; findings below may reference images not displayed]

FINDINGS: Cardiovascular: Thoracic aorta and its branches demonstrate
atherosclerotic calcification without aneurysmal dilatation or
dissection. No cardiac enlargement is noted. The pulmonary artery
shows a normal branching pattern. No filling defect is identified
within the pulmonary artery to suggest pulmonary embolism.

Mediastinum/Nodes: Thoracic inlet is within normal limits. No
sizable hilar or mediastinal adenopathy is noted. The esophagus as
visualized is within normal limits.

Lungs/Pleura: Lungs are well aerated bilaterally with mild bibasilar
consolidation and small effusions. A few scattered calcified
granulomas are noted. No pneumothorax is seen.

Upper Abdomen: No acute abnormality.

Musculoskeletal: No acute bony abnormality is noted.

Review of the MIP images confirms the above findings.
IMPRESSION: Bibasilar consolidation with small effusions.

No evidence of pulmonary emboli.

Changes of prior granulomatous disease.

## 2022-09-15 IMAGING — DX DG CHEST 1V PORT
1 series · 1 of 1 positions shown · non-contrast
Comparison: January 10, 2021.

CLINICAL DATA: Acute hypoxemic respiratory failure.

EXAM:
PORTABLE CHEST 1 VIEW

[chest ap]
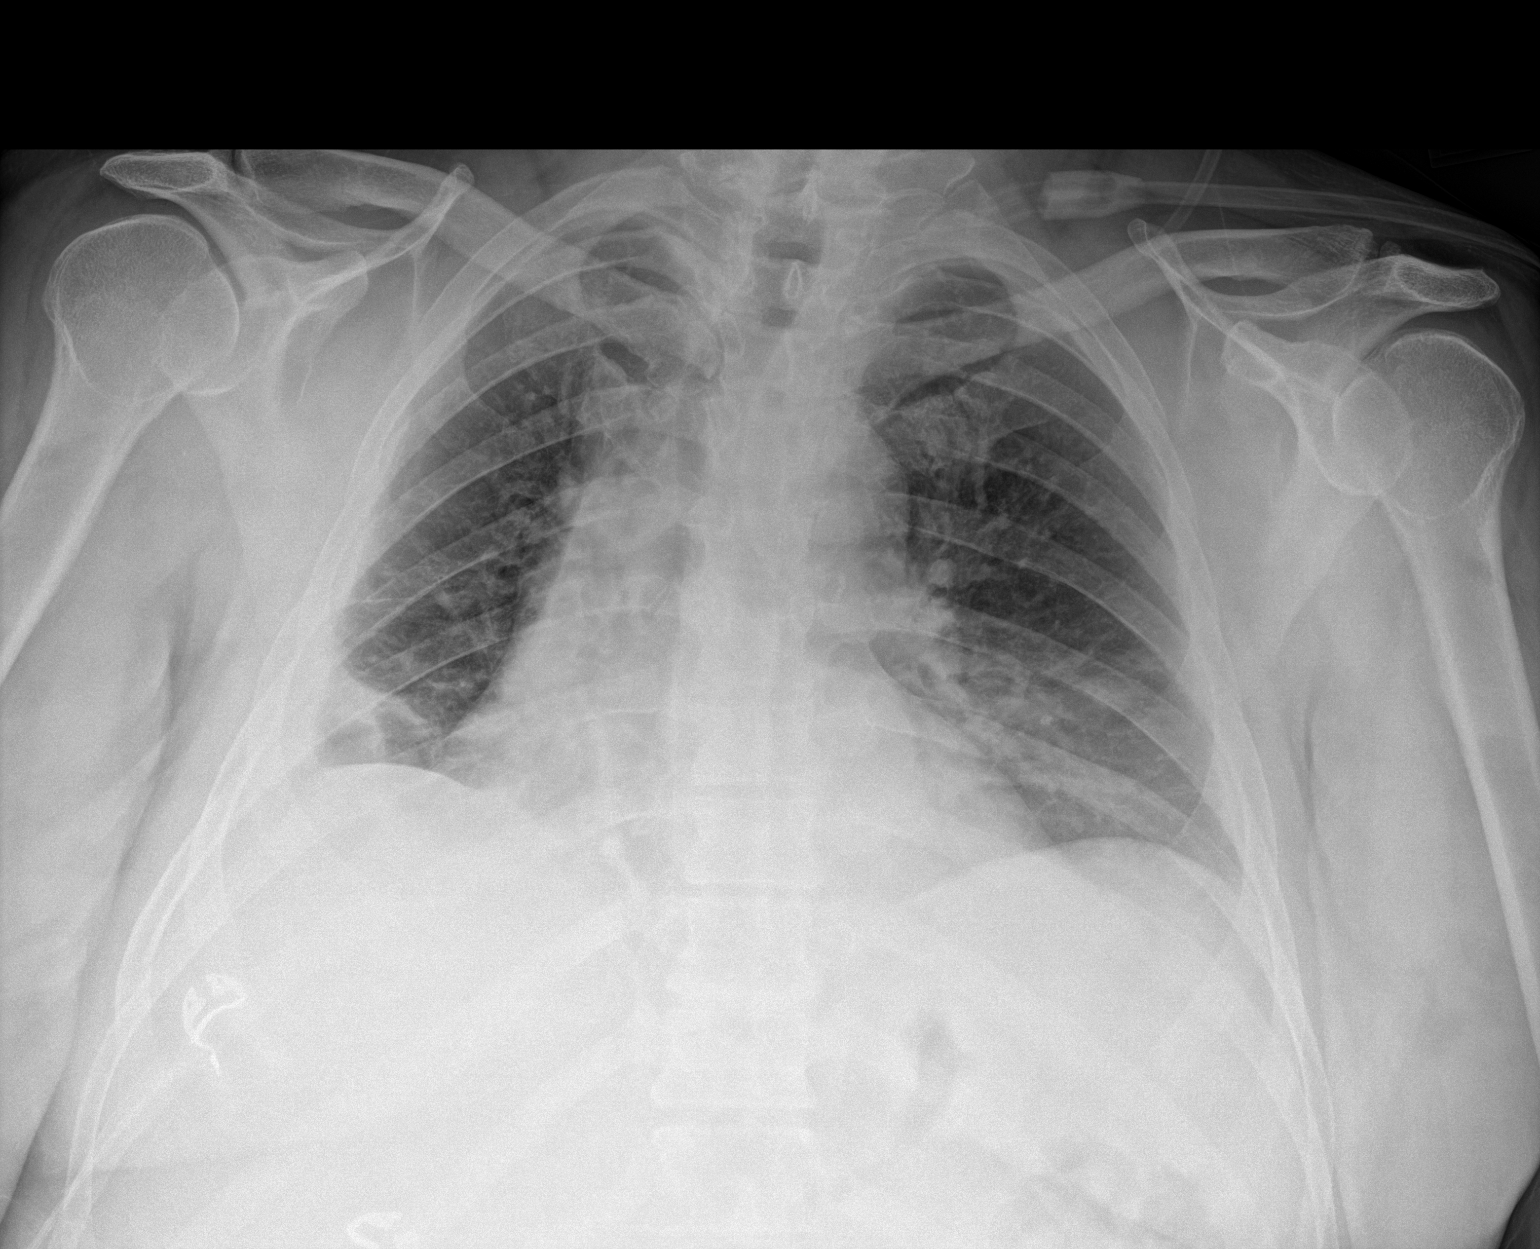

[1 of 1 positions shown; findings below may reference images not displayed]

FINDINGS: Bibasilar opacities, slightly improved on the left and worse on the
right. Suspected new small right pleural effusion. No visible
pneumothorax or left-sided pleural effusion. Similar
cardiomediastinal silhouette. No acute osseous abnormality.
IMPRESSION: Bibasilar opacities, slightly improved on the left and worse on the
right. Suspected new small right pleural effusion.

## 2022-10-11 DIAGNOSIS — Z23 Encounter for immunization: Secondary | ICD-10-CM | POA: Diagnosis not present

## 2022-10-20 ENCOUNTER — Other Ambulatory Visit: Payer: Self-pay | Admitting: Family Medicine

## 2022-10-20 DIAGNOSIS — E1169 Type 2 diabetes mellitus with other specified complication: Secondary | ICD-10-CM

## 2022-10-20 DIAGNOSIS — N289 Disorder of kidney and ureter, unspecified: Secondary | ICD-10-CM

## 2022-10-20 DIAGNOSIS — E1136 Type 2 diabetes mellitus with diabetic cataract: Secondary | ICD-10-CM

## 2022-10-21 ENCOUNTER — Other Ambulatory Visit (INDEPENDENT_AMBULATORY_CARE_PROVIDER_SITE_OTHER): Payer: BC Managed Care – PPO

## 2022-10-21 DIAGNOSIS — E1136 Type 2 diabetes mellitus with diabetic cataract: Secondary | ICD-10-CM

## 2022-10-21 DIAGNOSIS — E785 Hyperlipidemia, unspecified: Secondary | ICD-10-CM | POA: Diagnosis not present

## 2022-10-21 DIAGNOSIS — N289 Disorder of kidney and ureter, unspecified: Secondary | ICD-10-CM | POA: Diagnosis not present

## 2022-10-21 DIAGNOSIS — E1169 Type 2 diabetes mellitus with other specified complication: Secondary | ICD-10-CM

## 2022-10-21 LAB — TRIGLYCERIDES: Triglycerides: 191 mg/dL — ABNORMAL HIGH (ref 0.0–149.0)

## 2022-10-21 LAB — RENAL FUNCTION PANEL
Albumin: 4.3 g/dL (ref 3.5–5.2)
BUN: 20 mg/dL (ref 6–23)
CO2: 28 mEq/L (ref 19–32)
Calcium: 8.7 mg/dL (ref 8.4–10.5)
Chloride: 107 mEq/L (ref 96–112)
Creatinine, Ser: 1.33 mg/dL (ref 0.40–1.50)
GFR: 57.26 mL/min — ABNORMAL LOW (ref >60.00)
Glucose, Bld: 121 mg/dL — ABNORMAL HIGH (ref 70–99)
Phosphorus: 3.7 mg/dL (ref 2.3–4.6)
Potassium: 4.6 mEq/L (ref 3.5–5.1)
Sodium: 141 mEq/L (ref 135–145)

## 2022-10-21 LAB — HEMOGLOBIN A1C: Hgb A1c MFr Bld: 6.7 % — ABNORMAL HIGH (ref 4.6–6.5)

## 2022-10-21 LAB — VITAMIN D 25 HYDROXY (VIT D DEFICIENCY, FRACTURES): VITD: 30.18 ng/mL (ref 30.00–100.00)

## 2022-11-02 ENCOUNTER — Ambulatory Visit: Payer: BC Managed Care – PPO | Admitting: Family Medicine

## 2022-11-02 ENCOUNTER — Encounter: Payer: Self-pay | Admitting: Family Medicine

## 2022-11-02 VITALS — BP 132/82 | HR 71 | Temp 97.3°F | Ht 67.25 in | Wt 210.0 lb

## 2022-11-02 DIAGNOSIS — E1136 Type 2 diabetes mellitus with diabetic cataract: Secondary | ICD-10-CM

## 2022-11-02 DIAGNOSIS — R7989 Other specified abnormal findings of blood chemistry: Secondary | ICD-10-CM

## 2022-11-02 DIAGNOSIS — E559 Vitamin D deficiency, unspecified: Secondary | ICD-10-CM | POA: Insufficient documentation

## 2022-11-02 MED ORDER — SEMAGLUTIDE (1 MG/DOSE) 4 MG/3ML ~~LOC~~ SOPN: 1.0000 mg | PEN_INJECTOR | SUBCUTANEOUS | 2 refills | Status: DC

## 2022-11-02 MED ORDER — VITAMIN D3 25 MCG (1000 UT) PO CAPS: 1.0000 | ORAL_CAPSULE | Freq: Every day | ORAL | Status: DC

## 2022-11-02 NOTE — Assessment & Plan Note (Signed)
Chronic, stable and controlled on current regimen. With weight gain noted, will increase ozempic to '1mg'$  weekly - monitor for tolerance on higher dose. RTC 4 mo DM f/u visit.

## 2022-11-02 NOTE — Assessment & Plan Note (Signed)
Low normal levels - rec start vit D 1000 IU daily.

## 2022-11-02 NOTE — Progress Notes (Signed)
Patient ID: Jermaine Harris, male    DOB: 1961/05/06, 61 y.o.   MRN: [id number]  This visit was conducted in person.  BP 132/82   Pulse 71   Temp (!) 97.3 F (36.3 C) (Temporal)   Ht 5' 7.25" (1.708 m)   Wt 210 lb (95.3 kg)   SpO2 94%   BMI 32.65 kg/m    CC: DM f/u visit  Subjective:   HPI: Jermaine Harris is a 61 y.o. male presenting on 11/02/2022 for Diabetes (Here for 3 mo f/u.)   He did take prednisone taper 07/2022 x2 by [facility] for acute on chronic lower back pain.  DM - does regularly check sugars: 30d average 128, range 114-140s. Compliant with antihyperglycemic regimen which includes: metformin XR '500mg'$  daily, ozempic 0.'5mg'$  weekly. Tolerating ozempic without nausea, constipation. Rare hypoglycemic symptoms, no low sugars. Denies paresthesias, blurry vision. Last diabetic eye exam 02/2021- saw this summer ([facility] at [facility] in [place]). Glucometer brand: [employer] [facility] true metrix air meter. Last foot exam: 01/2022. DSME: declined. Lab Results  Component Value Date   HGBA1C 6.7 (H) 10/21/2022   Diabetic Foot Exam - Simple   No data filed    Lab Results  Component Value Date   MICROALBUR 0.8 07/22/2022         Relevant past medical, surgical, family and social history reviewed and updated as indicated. Interim medical history since our last visit reviewed. Allergies and medications reviewed and updated. Outpatient Medications Prior to Visit  Medication Sig Dispense Refill   ALPRAZolam (NIRAVAM) 0.5 MG dissolvable tablet Take 1 tablet (0.5 mg total) by mouth daily as needed for anxiety (plane rides). 15 tablet 0   atorvastatin (LIPITOR) 10 MG tablet Take 1 tablet (10 mg total) by mouth daily. 90 tablet 3   citalopram (CELEXA) 20 MG tablet Take 1 tablet (20 mg total) by mouth daily. 90 tablet 3   gabapentin (NEURONTIN) 300 MG capsule TAKE 1 CAPSULE BY MOUTH EVERYDAY AT BEDTIME 90 capsule 3   ibuprofen (ADVIL,MOTRIN) 200 MG tablet Take 200 mg by mouth as needed.      metFORMIN (GLUCOPHAGE-XR) 500 MG 24 hr tablet Take 1 tablet (500 mg total) by mouth daily. 90 tablet 3   valsartan-hydrochlorothiazide (DIOVAN-HCT) 160-12.5 MG tablet Take 1 tablet by mouth daily. 90 tablet 3   Semaglutide,0.25 or 0.'5MG'$ /DOS, (OZEMPIC, 0.25 OR 0.5 MG/DOSE,) 2 MG/3ML SOPN Inject 0.5 mg into the skin once a week. 3 mL 6   cyclobenzaprine (FLEXERIL) 10 MG tablet Take 1 tablet (10 mg total) by mouth 2 (two) times daily as needed for muscle spasms. 20 tablet 0   predniSONE (STERAPRED UNI-PAK 21 TAB) 10 MG (21) TBPK tablet Take by mouth daily. As directed 21 tablet 0   No facility-administered medications prior to visit.     Per HPI unless specifically indicated in ROS section below Review of Systems  Objective:  BP 132/82   Pulse 71   Temp (!) 97.3 F (36.3 C) (Temporal)   Ht 5' 7.25" (1.708 m)   Wt 210 lb (95.3 kg)   SpO2 94%   BMI 32.65 kg/m   Wt Readings from Last 3 Encounters:  11/02/22 210 lb (95.3 kg)  08/02/22 204 lb 6 oz (92.7 kg)  07/16/22 200 lb (90.7 kg)      Physical Exam Vitals and nursing note reviewed.  Constitutional:      Appearance: Normal appearance. He is not ill-appearing.  HENT:     Mouth/Throat:  Mouth: Mucous membranes are moist.     Pharynx: Oropharynx is clear. No oropharyngeal exudate or posterior oropharyngeal erythema.  Eyes:     Extraocular Movements: Extraocular movements intact.     Conjunctiva/sclera: Conjunctivae normal.     Pupils: Pupils are equal, round, and reactive to light.  Cardiovascular:     Rate and Rhythm: Normal rate and regular rhythm.     Pulses: Normal pulses.     Heart sounds: Normal heart sounds. No murmur heard. Pulmonary:     Effort: Pulmonary effort is normal. No respiratory distress.     Breath sounds: Normal breath sounds. No wheezing, rhonchi or rales.  Musculoskeletal:     Right lower leg: No edema.     Left lower leg: No edema.     Comments: See HPI for foot exam if done  Skin:    General:  Skin is warm and dry.     Findings: No rash.  Neurological:     Mental Status: He is alert.  Psychiatric:        Mood and Affect: Mood normal.        Behavior: Behavior normal.       Results for orders placed or performed in visit on 10/21/22  VITAMIN D 25 Hydroxy (Vit-D Deficiency, Fractures)  Result Value Ref Range   VITD 30.18 30.00 - 100.00 ng/mL  Triglycerides  Result Value Ref Range   Triglycerides 191.0 (H) 0.0 - 149.0 mg/dL  Renal function panel  Result Value Ref Range   Sodium 141 135 - 145 mEq/L   Potassium 4.6 3.5 - 5.1 mEq/L   Chloride 107 96 - 112 mEq/L   CO2 28 19 - 32 mEq/L   Albumin 4.3 3.5 - 5.2 g/dL   BUN 20 6 - 23 mg/dL   Creatinine, Ser 1.33 0.40 - 1.50 mg/dL   Glucose, Bld 121 (H) 70 - 99 mg/dL   Phosphorus 3.7 2.3 - 4.6 mg/dL   GFR 57.26 (L) >60.00 mL/min   Calcium 8.7 8.4 - 10.5 mg/dL  Hemoglobin A1c  Result Value Ref Range   Hgb A1c MFr Bld 6.7 (H) 4.6 - 6.5 %   Lab Results  Component Value Date   CHOL 90 07/22/2022   HDL 35.70 (L) 07/22/2022   LDLCALC 19 01/31/2022   LDLDIRECT 32.0 07/22/2022   TRIG 191.0 (H) 10/21/2022   CHOLHDL 3 07/22/2022     Assessment & Plan:   Problem List Items Addressed This Visit       Unprioritized   Type 2 diabetes mellitus with ophthalmic complication, without long-term current use of insulin ([place]) - Primary    Chronic, stable and controlled on current regimen. With weight gain noted, will increase ozempic to '1mg'$  weekly - monitor for tolerance on higher dose. RTC 4 mo DM f/u visit.       Relevant Medications   Semaglutide, 1 MG/DOSE, 4 MG/3ML SOPN   Low serum vitamin D    Low normal levels - rec start vit D 1000 IU daily.         Meds ordered this encounter  Medications   Semaglutide, 1 MG/DOSE, 4 MG/3ML SOPN    Sig: Inject 1 mg as directed once a week.    Dispense:  9 mL    Refill:  2    Note new dose   Cholecalciferol (VITAMIN D3) 25 MCG (1000 UT) CAPS    Sig: Take 1 capsule (1,000 Units  total) by mouth daily.    Dispense:  30 capsule   No orders of the defined types were placed in this encounter.    Patient Instructions  We will request latest diabetic eye exam from [facility] at [facility]  Increase ozempic to '1mg'$  weekly - new dose sent to pharmacy. You can double up on current dose until you run out.  Good to see you today Return in 4 months for diabetes check  Follow up plan: Return in about 4 months (around 03/03/2023) for follow up visit.  Ria Bush, MD

## 2022-11-02 NOTE — Patient Instructions (Addendum)
We will request latest diabetic eye exam from [facility] at [facility].  Increase ozempic to '1mg'$  weekly - new dose sent to pharmacy. You can double up on current dose until you run out.  Good to see you today Return in 4 months for diabetes check

## 2022-11-04 ENCOUNTER — Other Ambulatory Visit: Payer: Self-pay | Admitting: Family Medicine

## 2022-11-07 ENCOUNTER — Encounter: Payer: Self-pay | Admitting: Family Medicine

## 2022-11-07 NOTE — Telephone Encounter (Signed)
Message from pharmacy:  Product Backordered/Unavailable.

## 2022-11-08 NOTE — Telephone Encounter (Signed)
Spoke with pt relaying Dr. G's message. Pt verbalizes understanding.  

## 2022-11-08 NOTE — Telephone Encounter (Signed)
Plz notify '1mg'$  ozempic unavailable. I've refilled 0.'5mg'$  dose for next 1-2 months then will retry '1mg'$  dose to see if available at that time. Remind [place] to send higher dose, have him keep check on pharmacy for '1mg'$  availability.

## 2023-02-26 ENCOUNTER — Other Ambulatory Visit: Payer: Self-pay | Admitting: Family Medicine

## 2023-02-27 NOTE — Telephone Encounter (Signed)
Refill request Ozempic Last refill 11/08/22  3 ml/1 Last office visit 11/02/22 Upcoming 03/06/23

## 2023-02-28 NOTE — Telephone Encounter (Signed)
Patient called in to follow up on this refill request 

## 2023-02-28 NOTE — Telephone Encounter (Signed)
ERx 

## 2023-03-03 ENCOUNTER — Ambulatory Visit: Payer: BC Managed Care – PPO | Admitting: Family Medicine

## 2023-03-06 ENCOUNTER — Encounter: Payer: Self-pay | Admitting: Family Medicine

## 2023-03-06 ENCOUNTER — Ambulatory Visit: Payer: BC Managed Care – PPO | Admitting: Family Medicine

## 2023-03-06 VITALS — BP 108/64 | HR 65 | Temp 97.4°F | Ht 67.25 in | Wt 209.0 lb

## 2023-03-06 DIAGNOSIS — E1136 Type 2 diabetes mellitus with diabetic cataract: Secondary | ICD-10-CM

## 2023-03-06 DIAGNOSIS — I10 Essential (primary) hypertension: Secondary | ICD-10-CM

## 2023-03-06 DIAGNOSIS — F411 Generalized anxiety disorder: Secondary | ICD-10-CM

## 2023-03-06 LAB — POCT GLYCOSYLATED HEMOGLOBIN (HGB A1C): Hemoglobin A1C: 6.2 % — AB (ref 4.0–5.6)

## 2023-03-06 MED ORDER — ESCITALOPRAM OXALATE 20 MG PO TABS: 20.0000 mg | ORAL_TABLET | Freq: Every day | ORAL | 1 refills | Status: DC

## 2023-03-06 MED ORDER — OZEMPIC (0.25 OR 0.5 MG/DOSE) 2 MG/3ML ~~LOC~~ SOPN: 0.5000 mg | PEN_INJECTOR | SUBCUTANEOUS | 3 refills | Status: DC

## 2023-03-06 NOTE — Assessment & Plan Note (Signed)
Notes mild worsening anxiety - will change celexa 20mg  to lexapro 20mg  daily. Reviewed increased bleeding risk with SSRI and NSAID use

## 2023-03-06 NOTE — Progress Notes (Signed)
Patient ID: Jermaine Harris, male    DOB: 10/17/1961, 62 y.o.   MRN: [id number]  This visit was conducted in person.  BP 108/64   Pulse 65   Temp (!) 97.4 F (36.3 C) (Temporal)   Ht 5' 7.25" (1.708 m)   Wt 209 lb (94.8 kg)   SpO2 96%   BMI 32.49 kg/m   BP Readings from Last 3 Encounters:  03/06/23 108/64  11/02/22 132/82  08/02/22 134/82    CC: 4 mo DM f/u visit  Subjective:   HPI: Jermaine Harris is a 62 y.o. male presenting on 03/06/2023 for Medical Management of Chronic Issues (4 mo diabetes follow up//Would like to discuss Celexa, he does not feel it is helping much. )   HTN - stable on diovan hct 160/12.5mg  daily. Doesn't check BP at home, BP Q000111Q systolic at recent blood donation. Denies dizziness, lightheadedness.   Mood - continues celexa 20mg  daily with limited benefit - notes increased anxiety over the last few months which he attributes to work stress. Initially on effexor 2015 - but caused weight gain and elevated BP. Has been on celexa ever since.   DM - does regularly check sugars fasting 120-130s, a few times a week. Compliant with antihyperglycemic regimen which includes: metformin XR 500mg  daily, ozempic 0.5mg  weekly. Was unable to find higher ozempic dose.  Denies low sugars or hypoglycemic symptoms. Denies paresthesias, blurry vision. Last diabetic eye exam 03/2022. Glucometer brand: [employer] [facility] true metrix air meter. Last foot exam: 01/2022 - DUE. DSME: declined.  Lab Results  Component Value Date   HGBA1C 6.2 (A) 03/06/2023   Diabetic Foot Exam - Simple   Simple Foot Form Diabetic Foot exam was performed with the following findings: Yes 03/06/2023 10:32 AM  Visual Inspection No deformities, no ulcerations, no other skin breakdown bilaterally: Yes Sensation Testing Intact to touch and monofilament testing bilaterally: Yes Pulse Check Posterior Tibialis and Dorsalis pulse intact bilaterally: Yes Comments 1+ DP bilaterally    Lab Results  Component Value  Date   MICROALBUR 0.8 07/22/2022         Relevant past medical, surgical, family and social history reviewed and updated as indicated. Interim medical history since our last visit reviewed. Allergies and medications reviewed and updated. Outpatient Medications Prior to Visit  Medication Sig Dispense Refill   atorvastatin (LIPITOR) 10 MG tablet Take 1 tablet (10 mg total) by mouth daily. 90 tablet 3   gabapentin (NEURONTIN) 300 MG capsule TAKE 1 CAPSULE BY MOUTH EVERYDAY AT BEDTIME 90 capsule 3   ibuprofen (ADVIL,MOTRIN) 200 MG tablet Take 200 mg by mouth as needed.     metFORMIN (GLUCOPHAGE-XR) 500 MG 24 hr tablet Take 1 tablet (500 mg total) by mouth daily. 90 tablet 3   valsartan-hydrochlorothiazide (DIOVAN-HCT) 160-12.5 MG tablet Take 1 tablet by mouth daily. 90 tablet 3   citalopram (CELEXA) 20 MG tablet Take 1 tablet (20 mg total) by mouth daily. 90 tablet 3   Semaglutide,0.25 or 0.5MG /DOS, (OZEMPIC, 0.25 OR 0.5 MG/DOSE,) 2 MG/3ML SOPN INJECT 0.5 MG INTO THE SKIN ONE TIME PER WEEK 3 mL 3   ALPRAZolam (NIRAVAM) 0.5 MG dissolvable tablet Take 1 tablet (0.5 mg total) by mouth daily as needed for anxiety (plane rides). (Patient not taking: Reported on 03/06/2023) 15 tablet 0   Cholecalciferol (VITAMIN D3) 25 MCG (1000 UT) CAPS Take 1 capsule (1,000 Units total) by mouth daily. 30 capsule    No facility-administered medications prior to visit.  Per HPI unless specifically indicated in ROS section below Review of Systems  Objective:  BP 108/64   Pulse 65   Temp (!) 97.4 F (36.3 C) (Temporal)   Ht 5' 7.25" (1.708 m)   Wt 209 lb (94.8 kg)   SpO2 96%   BMI 32.49 kg/m   Wt Readings from Last 3 Encounters:  03/06/23 209 lb (94.8 kg)  11/02/22 210 lb (95.3 kg)  08/02/22 204 lb 6 oz (92.7 kg)      Physical Exam Vitals and nursing note reviewed.  Constitutional:      Appearance: Normal appearance. He is not ill-appearing.  Eyes:     Extraocular Movements: Extraocular movements  intact.     Conjunctiva/sclera: Conjunctivae normal.     Pupils: Pupils are equal, round, and reactive to light.  Cardiovascular:     Rate and Rhythm: Normal rate and regular rhythm.     Pulses: Normal pulses.     Heart sounds: Normal heart sounds. No murmur heard. Pulmonary:     Effort: Pulmonary effort is normal. No respiratory distress.     Breath sounds: Normal breath sounds. No wheezing, rhonchi or rales.  Musculoskeletal:     Right lower leg: No edema.     Left lower leg: No edema.     Comments: See HPI for foot exam if done  Skin:    General: Skin is warm and dry.     Findings: No rash.  Neurological:     Mental Status: He is alert.  Psychiatric:        Mood and Affect: Mood normal.        Behavior: Behavior normal.       Results for orders placed or performed in visit on 03/06/23  POCT glycosylated hemoglobin (Hb A1C)  Result Value Ref Range   Hemoglobin A1C 6.2 (A) 4.0 - 5.6 %   HbA1c POC (<> result, manual entry)     HbA1c, POC (prediabetic range)     HbA1c, POC (controlled diabetic range)        03/06/2023    9:59 AM 08/02/2022   11:05 AM 07/26/2021    8:15 AM 06/25/2020   12:13 PM 01/02/2019   11:51 AM  Depression screen PHQ 2/9  Decreased Interest 1 0 1 0 0  Down, Depressed, Hopeless 1 0 0 1 1  PHQ - 2 Score 2 0 1 1 1   Altered sleeping 1 1 1 1 1   Tired, decreased energy 1 1 0 1 1  Change in appetite 2 1 0 0 0  Feeling bad or failure about yourself  1 0 0 0 1  Trouble concentrating 0 0 0 0 0  Moving slowly or fidgety/restless 0 0 0 0 0  Suicidal thoughts 0 0 0 0 0  PHQ-9 Score 7 3 2 3 4   Difficult doing work/chores Not difficult at all Not difficult at all         03/06/2023    9:59 AM 08/02/2022   11:06 AM 07/26/2021    8:15 AM 06/25/2020   12:13 PM  GAD 7 : Generalized Anxiety Score  Nervous, Anxious, on Edge 1 1 1 1   Control/stop worrying 1 1 1 1   Worry too much - different things 1 1 1 1   Trouble relaxing 1 0 1 1  Restless 0 0 0 0  Easily annoyed  or irritable 1 1 1 1   Afraid - awful might happen 0 0 0 0  Total GAD 7 Score 5  4 5 5   Anxiety Difficulty Not difficult at all Not difficult at all     Assessment & Plan:   Problem List Items Addressed This Visit     Essential hypertension    Chronic, stable on current regimen - recent blood donation reading Q000111Q systolic      GAD (generalized anxiety disorder)    Notes mild worsening anxiety - will change celexa 20mg  to lexapro 20mg  daily. Reviewed increased bleeding risk with SSRI and NSAID use      Relevant Medications   escitalopram (LEXAPRO) 20 MG tablet   Type 2 diabetes mellitus with ophthalmic complication, without long-term current use of insulin - Primary    Chronic, stable. Continue current regimen. Will send 3 month supply of ozempic.       Relevant Medications   Semaglutide,0.25 or 0.5MG /DOS, (OZEMPIC, 0.25 OR 0.5 MG/DOSE,) 2 MG/3ML SOPN   Other Relevant Orders   POCT glycosylated hemoglobin (Hb A1C) (Completed)     Meds ordered this encounter  Medications   Semaglutide,0.25 or 0.5MG /DOS, (OZEMPIC, 0.25 OR 0.5 MG/DOSE,) 2 MG/3ML SOPN    Sig: Inject 0.5 mg into the skin once a week.    Dispense:  9 mL    Refill:  3   escitalopram (LEXAPRO) 20 MG tablet    Sig: Take 1 tablet (20 mg total) by mouth daily.    Dispense:  90 tablet    Refill:  1    To replace celexa    Orders Placed This Encounter  Procedures   POCT glycosylated hemoglobin (Hb A1C)    Patient Instructions  You are doing well today - continue current medicines - change celexa 20mg  daily to lexapro 20mg  daily.  Good to see you today Return in 5 months for physical.   Follow up plan: Return in about 5 months (around 08/06/2023) for annual exam, prior fasting for blood work.  Ria Bush, MD

## 2023-03-06 NOTE — Assessment & Plan Note (Addendum)
Chronic, stable. Continue current regimen. Will send 3 month supply of ozempic.

## 2023-03-06 NOTE — Patient Instructions (Addendum)
You are doing well today - continue current medicines - change celexa 20mg  daily to lexapro 20mg  daily.  Good to see you today Return in 5 months for physical.

## 2023-03-06 NOTE — Assessment & Plan Note (Signed)
Chronic, stable on current regimen - recent blood donation reading Q000111Q systolic

## 2023-05-08 DIAGNOSIS — H2513 Age-related nuclear cataract, bilateral: Secondary | ICD-10-CM | POA: Diagnosis not present

## 2023-05-08 DIAGNOSIS — E119 Type 2 diabetes mellitus without complications: Secondary | ICD-10-CM | POA: Diagnosis not present

## 2023-05-08 DIAGNOSIS — H524 Presbyopia: Secondary | ICD-10-CM | POA: Diagnosis not present

## 2023-05-25 ENCOUNTER — Other Ambulatory Visit: Payer: Self-pay | Admitting: Family Medicine

## 2023-05-25 DIAGNOSIS — E1136 Type 2 diabetes mellitus with diabetic cataract: Secondary | ICD-10-CM

## 2023-05-25 DIAGNOSIS — E1169 Type 2 diabetes mellitus with other specified complication: Secondary | ICD-10-CM

## 2023-07-20 ENCOUNTER — Encounter (INDEPENDENT_AMBULATORY_CARE_PROVIDER_SITE_OTHER): Payer: Self-pay

## 2023-07-31 ENCOUNTER — Other Ambulatory Visit: Payer: Self-pay | Admitting: Family Medicine

## 2023-07-31 DIAGNOSIS — R7989 Other specified abnormal findings of blood chemistry: Secondary | ICD-10-CM

## 2023-07-31 DIAGNOSIS — E1136 Type 2 diabetes mellitus with diabetic cataract: Secondary | ICD-10-CM

## 2023-07-31 DIAGNOSIS — N289 Disorder of kidney and ureter, unspecified: Secondary | ICD-10-CM

## 2023-07-31 DIAGNOSIS — Z125 Encounter for screening for malignant neoplasm of prostate: Secondary | ICD-10-CM

## 2023-07-31 DIAGNOSIS — E1169 Type 2 diabetes mellitus with other specified complication: Secondary | ICD-10-CM

## 2023-08-02 ENCOUNTER — Other Ambulatory Visit (INDEPENDENT_AMBULATORY_CARE_PROVIDER_SITE_OTHER): Payer: BC Managed Care – PPO

## 2023-08-02 DIAGNOSIS — E785 Hyperlipidemia, unspecified: Secondary | ICD-10-CM

## 2023-08-02 DIAGNOSIS — N289 Disorder of kidney and ureter, unspecified: Secondary | ICD-10-CM | POA: Diagnosis not present

## 2023-08-02 DIAGNOSIS — E1136 Type 2 diabetes mellitus with diabetic cataract: Secondary | ICD-10-CM

## 2023-08-02 DIAGNOSIS — Z125 Encounter for screening for malignant neoplasm of prostate: Secondary | ICD-10-CM

## 2023-08-02 DIAGNOSIS — E1169 Type 2 diabetes mellitus with other specified complication: Secondary | ICD-10-CM | POA: Diagnosis not present

## 2023-08-02 DIAGNOSIS — R7989 Other specified abnormal findings of blood chemistry: Secondary | ICD-10-CM

## 2023-08-02 LAB — COMPREHENSIVE METABOLIC PANEL
ALT: 29 U/L (ref 0–53)
AST: 21 U/L (ref 0–37)
Albumin: 4.2 g/dL (ref 3.5–5.2)
Alkaline Phosphatase: 61 U/L (ref 39–117)
BUN: 29 mg/dL — ABNORMAL HIGH (ref 6–23)
CO2: 26 mEq/L (ref 19–32)
Calcium: 8.9 mg/dL (ref 8.4–10.5)
Chloride: 105 mEq/L (ref 96–112)
Creatinine, Ser: 1.34 mg/dL (ref 0.40–1.50)
GFR: 56.44 mL/min — ABNORMAL LOW (ref >60.00)
Glucose, Bld: 133 mg/dL — ABNORMAL HIGH (ref 70–99)
Potassium: 4.3 mEq/L (ref 3.5–5.1)
Sodium: 142 mEq/L (ref 135–145)
Total Bilirubin: 0.3 mg/dL (ref 0.2–1.2)
Total Protein: 6.5 g/dL (ref 6.0–8.3)

## 2023-08-02 LAB — CBC WITH DIFFERENTIAL/PLATELET
Basophils Absolute: 0.1 10*3/uL (ref 0.0–0.1)
Basophils Relative: 1.4 % (ref 0.0–3.0)
Eosinophils Absolute: 0.4 10*3/uL (ref 0.0–0.7)
Eosinophils Relative: 7 % — ABNORMAL HIGH (ref 0.0–5.0)
HCT: 39.7 % (ref 39.0–52.0)
Hemoglobin: 13 g/dL (ref 13.0–17.0)
Lymphocytes Relative: 26.4 % (ref 12.0–46.0)
Lymphs Abs: 1.7 10*3/uL (ref 0.7–4.0)
MCHC: 32.6 g/dL (ref 30.0–36.0)
MCV: 86.2 fl (ref 78.0–100.0)
Monocytes Absolute: 0.8 10*3/uL (ref 0.1–1.0)
Monocytes Relative: 12.2 % — ABNORMAL HIGH (ref 3.0–12.0)
Neutro Abs: 3.3 10*3/uL (ref 1.4–7.7)
Neutrophils Relative %: 53 % (ref 43.0–77.0)
Platelets: 221 10*3/uL (ref 150.0–400.0)
RBC: 4.61 Mil/uL (ref 4.22–5.81)
RDW: 13.7 % (ref 11.5–15.5)
WBC: 6.3 10*3/uL (ref 4.0–10.5)

## 2023-08-02 LAB — LIPID PANEL
Cholesterol: 103 mg/dL (ref 0–200)
HDL: 30 mg/dL — ABNORMAL LOW (ref >39.00)
NonHDL: 72.6
Total CHOL/HDL Ratio: 3
Triglycerides: 201 mg/dL — ABNORMAL HIGH (ref 0.0–149.0)
VLDL: 40.2 mg/dL — ABNORMAL HIGH (ref 0.0–40.0)

## 2023-08-02 LAB — PSA: PSA: 0.63 ng/mL (ref 0.10–4.00)

## 2023-08-02 LAB — VITAMIN D 25 HYDROXY (VIT D DEFICIENCY, FRACTURES): VITD: 21.25 ng/mL — ABNORMAL LOW (ref 30.00–100.00)

## 2023-08-02 LAB — HEMOGLOBIN A1C: Hgb A1c MFr Bld: 6.6 % — ABNORMAL HIGH (ref 4.6–6.5)

## 2023-08-02 LAB — MICROALBUMIN / CREATININE URINE RATIO
Creatinine,U: 489.6 mg/dL
Microalb Creat Ratio: 0.4 mg/g (ref 0.0–30.0)
Microalb, Ur: 1.9 mg/dL (ref 0.0–1.9)

## 2023-08-02 LAB — LDL CHOLESTEROL, DIRECT: Direct LDL: 53 mg/dL

## 2023-08-09 ENCOUNTER — Ambulatory Visit (INDEPENDENT_AMBULATORY_CARE_PROVIDER_SITE_OTHER): Payer: BC Managed Care – PPO | Admitting: Family Medicine

## 2023-08-09 ENCOUNTER — Encounter: Payer: Self-pay | Admitting: Family Medicine

## 2023-08-09 VITALS — BP 126/82 | HR 85 | Temp 97.9°F | Ht 66.5 in | Wt 209.1 lb

## 2023-08-09 DIAGNOSIS — Z23 Encounter for immunization: Secondary | ICD-10-CM

## 2023-08-09 DIAGNOSIS — N183 Chronic kidney disease, stage 3 unspecified: Secondary | ICD-10-CM

## 2023-08-09 DIAGNOSIS — H6121 Impacted cerumen, right ear: Secondary | ICD-10-CM

## 2023-08-09 DIAGNOSIS — Z0001 Encounter for general adult medical examination with abnormal findings: Secondary | ICD-10-CM | POA: Diagnosis not present

## 2023-08-09 DIAGNOSIS — I10 Essential (primary) hypertension: Secondary | ICD-10-CM

## 2023-08-09 DIAGNOSIS — E66811 Obesity, class 1: Secondary | ICD-10-CM

## 2023-08-09 DIAGNOSIS — E1136 Type 2 diabetes mellitus with diabetic cataract: Secondary | ICD-10-CM

## 2023-08-09 DIAGNOSIS — E669 Obesity, unspecified: Secondary | ICD-10-CM

## 2023-08-09 DIAGNOSIS — R200 Anesthesia of skin: Secondary | ICD-10-CM

## 2023-08-09 DIAGNOSIS — K760 Fatty (change of) liver, not elsewhere classified: Secondary | ICD-10-CM

## 2023-08-09 DIAGNOSIS — E559 Vitamin D deficiency, unspecified: Secondary | ICD-10-CM

## 2023-08-09 DIAGNOSIS — Z0000 Encounter for general adult medical examination without abnormal findings: Secondary | ICD-10-CM

## 2023-08-09 DIAGNOSIS — E785 Hyperlipidemia, unspecified: Secondary | ICD-10-CM

## 2023-08-09 DIAGNOSIS — F411 Generalized anxiety disorder: Secondary | ICD-10-CM

## 2023-08-09 DIAGNOSIS — E1122 Type 2 diabetes mellitus with diabetic chronic kidney disease: Secondary | ICD-10-CM

## 2023-08-09 DIAGNOSIS — E1169 Type 2 diabetes mellitus with other specified complication: Secondary | ICD-10-CM

## 2023-08-09 MED ORDER — ATORVASTATIN CALCIUM 10 MG PO TABS
10.0000 mg | ORAL_TABLET | Freq: Every day | ORAL | 4 refills | Status: DC
Start: 2023-08-09 — End: 2024-08-19

## 2023-08-09 MED ORDER — VALSARTAN-HYDROCHLOROTHIAZIDE 160-12.5 MG PO TABS
1.0000 | ORAL_TABLET | Freq: Every day | ORAL | 4 refills | Status: DC
Start: 2023-08-09 — End: 2024-08-12

## 2023-08-09 MED ORDER — GABAPENTIN 300 MG PO CAPS
ORAL_CAPSULE | ORAL | 4 refills | Status: DC
Start: 2023-08-09 — End: 2024-08-26

## 2023-08-09 MED ORDER — ESCITALOPRAM OXALATE 20 MG PO TABS
20.0000 mg | ORAL_TABLET | Freq: Every day | ORAL | 4 refills | Status: DC
Start: 2023-08-09 — End: 2024-08-19

## 2023-08-09 MED ORDER — SEMAGLUTIDE (1 MG/DOSE) 4 MG/3ML ~~LOC~~ SOPN: 1.0000 mg | PEN_INJECTOR | SUBCUTANEOUS | 3 refills | Status: DC

## 2023-08-09 MED ORDER — METFORMIN HCL ER 500 MG PO TB24
500.0000 mg | ORAL_TABLET | Freq: Every day | ORAL | 4 refills | Status: DC
Start: 2023-08-09 — End: 2024-08-19

## 2023-08-09 NOTE — Patient Instructions (Addendum)
Flu shot today  Consider RSV vaccine at local pharmacy if interested  Increase ozempic to 1mg  weekly Start vitamin D3 1000 units daily.  Return in 6 months for diabetes follow up visit

## 2023-08-09 NOTE — Assessment & Plan Note (Signed)
Preventative protocols reviewed and updated unless pt declined. Discussed healthy diet and lifestyle.  

## 2023-08-09 NOTE — Assessment & Plan Note (Signed)
Chronic LDL at goal on low dose atorvastatin. Triglycerides elevated - discussed diet choices to improve this The ASCVD Risk score (Arnett DK, et al., 2019) failed to calculate for the following reasons:   The valid total cholesterol range is 130 to 320 mg/dL

## 2023-08-09 NOTE — Assessment & Plan Note (Signed)
Discussed CKD and importance of blood pressure and sugar control - rec start vit D 1000 international units daily. Reassess at f/u visit.

## 2023-08-09 NOTE — Progress Notes (Signed)
Ph: [phone] Fax: [phone]   Patient ID: Jermaine Harris, male    DOB: 09/24/1961, 62 y.o.   MRN: [id number]  This visit was conducted in person.  BP 126/82   Pulse 85   Temp 97.9 F (36.6 C) (Temporal)   Ht 5' 6.5" (1.689 m)   Wt 209 lb 2 oz (94.9 kg)   SpO2 97%   BMI 33.25 kg/m    Hearing Screening   500Hz  1000Hz  2000Hz  4000Hz   Right ear 20 25 20 20   Left ear 20 25 25  0   CC: CPE Subjective:   HPI: Jermaine Harris is a 62 y.o. male presenting on 08/09/2023 for Annual Exam and Ear Problem (C/o R ear fullness. Started 07/25/23 after swimming. Denies pain. )   Gabapentin effective for neuropathic pain. Persistent L leg numbness after herniated disc s/p microdiscectomy L4/5/S1 (2016).   H/o legionella pneumonia.   Mood - continues lexapro 20mg  daily - more effective than celexa.   Notes persistent R ear fullness that started 2 wks ago after swimming. No pain, no ST, congestion, rhinorrhea.   Preventative: COLONOSCOPY WITH PROPOFOL 05/29/2019 - TA, diverticulosis, rpt 5 yrs (Tahiliani, Dolphus Jenny, MD)  Prostate cancer screening - yearly screening. Nocturia x1. No fmhx prostate cancer Lung cancer screening - not eligible  Flu shot yearly COVID vaccine - Pfizer 02/2020 x2, booster x2 09/2020, 03/2021 Pneumovax 2019 Td 2010, Tdap 12/2018 RSV - discussed  Shingrix - 06/2020, 02/2021 Got MMR at school this fall.  Advanced directive -  Seat belt use discussed Sunscreen use discussed, no changing moles on skin.  Sleep - averaging 7 hours/night Non smoker  Alcohol - 1 glass wine a few times a week  Dentist q6 mo Eye exam yearly   Divorced (2015) Remarried (2017) Occupation: Production manager Counsellor) Edu: PhD  Activity: golf, walking, yardwork Diet: good water, vegetables daily (salads)      Relevant past medical, surgical, family and social history reviewed and updated as indicated. Interim medical history since our last visit reviewed. Allergies and  medications reviewed and updated. Outpatient Medications Prior to Visit  Medication Sig Dispense Refill   ALPRAZolam (NIRAVAM) 0.5 MG dissolvable tablet Take 1 tablet (0.5 mg total) by mouth daily as needed for anxiety (plane rides). 15 tablet 0   ibuprofen (ADVIL,MOTRIN) 200 MG tablet Take 200 mg by mouth as needed.     atorvastatin (LIPITOR) 10 MG tablet TAKE 1 TABLET BY MOUTH EVERY DAY 90 tablet 0   escitalopram (LEXAPRO) 20 MG tablet Take 1 tablet (20 mg total) by mouth daily. 90 tablet 1   gabapentin (NEURONTIN) 300 MG capsule TAKE 1 CAPSULE BY MOUTH EVERYDAY AT BEDTIME 90 capsule 3   metFORMIN (GLUCOPHAGE-XR) 500 MG 24 hr tablet TAKE 1 TABLET (500 MG TOTAL) BY MOUTH DAILY. 90 tablet 0   Semaglutide,0.25 or 0.5MG /DOS, (OZEMPIC, 0.25 OR 0.5 MG/DOSE,) 2 MG/3ML SOPN Inject 0.5 mg into the skin once a week. 9 mL 3   valsartan-hydrochlorothiazide (DIOVAN-HCT) 160-12.5 MG tablet Take 1 tablet by mouth daily. 90 tablet 3   Cholecalciferol (VITAMIN D3) 25 MCG (1000 UT) CAPS Take 1 capsule (1,000 Units total) by mouth daily. 30 capsule    No facility-administered medications prior to visit.     Per HPI unless specifically indicated in ROS section below Review of Systems  Constitutional:  Negative for activity change, appetite change, chills, fatigue, fever and unexpected weight change.  HENT:  Negative for hearing loss.   Eyes:  Negative for  visual disturbance.  Respiratory:  Negative for cough, chest tightness, shortness of breath and wheezing.   Cardiovascular:  Negative for chest pain, palpitations and leg swelling.  Gastrointestinal:  Positive for diarrhea (loose stools, ?metformin related). Negative for abdominal distention, abdominal pain, blood in stool, constipation, nausea and vomiting.  Genitourinary:  Negative for difficulty urinating and hematuria.  Musculoskeletal:  Negative for arthralgias, myalgias and neck pain.  Skin:  Negative for rash.  Neurological:  Negative for dizziness,  seizures, syncope and headaches.  Hematological:  Negative for adenopathy. Does not bruise/bleed easily.  Psychiatric/Behavioral:  Negative for dysphoric mood. The patient is not nervous/anxious.     Objective:  BP 126/82   Pulse 85   Temp 97.9 F (36.6 C) (Temporal)   Ht 5' 6.5" (1.689 m)   Wt 209 lb 2 oz (94.9 kg)   SpO2 97%   BMI 33.25 kg/m   Wt Readings from Last 3 Encounters:  08/09/23 209 lb 2 oz (94.9 kg)  03/06/23 209 lb (94.8 kg)  11/02/22 210 lb (95.3 kg)      Physical Exam Vitals and nursing note reviewed.  Constitutional:      General: He is not in acute distress.    Appearance: Normal appearance. He is well-developed. He is not ill-appearing.  HENT:     Head: Normocephalic and atraumatic.     Right Ear: Hearing, ear canal and external ear normal. No decreased hearing noted. There is impacted cerumen.     Left Ear: Hearing, tympanic membrane, ear canal and external ear normal. No decreased hearing noted. There is no impacted cerumen.     Ears:     Comments: R cerumen impaction    Nose: Nose normal.     Mouth/Throat:     Mouth: Mucous membranes are moist.     Pharynx: Oropharynx is clear. No oropharyngeal exudate or posterior oropharyngeal erythema.  Eyes:     General: No scleral icterus.    Extraocular Movements: Extraocular movements intact.     Conjunctiva/sclera: Conjunctivae normal.     Pupils: Pupils are equal, round, and reactive to light.  Neck:     Thyroid: No thyroid mass or thyromegaly.  Cardiovascular:     Rate and Rhythm: Normal rate and regular rhythm.     Pulses: Normal pulses.          Radial pulses are 2+ on the right side and 2+ on the left side.     Heart sounds: Normal heart sounds. No murmur heard. Pulmonary:     Effort: Pulmonary effort is normal. No respiratory distress.     Breath sounds: Normal breath sounds. No wheezing, rhonchi or rales.  Abdominal:     General: Bowel sounds are normal. There is no distension.     Palpations:  Abdomen is soft. There is no mass.     Tenderness: There is no abdominal tenderness. There is no guarding or rebound.     Hernia: No hernia is present.  Musculoskeletal:        General: Normal range of motion.     Cervical back: Normal range of motion and neck supple.     Right lower leg: No edema.     Left lower leg: No edema.  Lymphadenopathy:     Cervical: No cervical adenopathy.  Skin:    General: Skin is warm and dry.     Findings: No rash.  Neurological:     General: No focal deficit present.     Mental Status: He  is alert and oriented to person, place, and time.  Psychiatric:        Mood and Affect: Mood normal.        Behavior: Behavior normal.        Thought Content: Thought content normal.        Judgment: Judgment normal.       Results for orders placed or performed in visit on 08/02/23  CBC with Differential/Platelet  Result Value Ref Range   WBC 6.3 4.0 - 10.5 K/uL   RBC 4.61 4.22 - 5.81 Mil/uL   Hemoglobin 13.0 13.0 - 17.0 g/dL   HCT 57.8 46.9 - 62.9 %   MCV 86.2 78.0 - 100.0 fl   MCHC 32.6 30.0 - 36.0 g/dL   RDW 52.8 41.3 - 24.4 %   Platelets 221.0 150.0 - 400.0 K/uL   Neutrophils Relative % 53.0 43.0 - 77.0 %   Lymphocytes Relative 26.4 12.0 - 46.0 %   Monocytes Relative 12.2 (H) 3.0 - 12.0 %   Eosinophils Relative 7.0 (H) 0.0 - 5.0 %   Basophils Relative 1.4 0.0 - 3.0 %   Neutro Abs 3.3 1.4 - 7.7 K/uL   Lymphs Abs 1.7 0.7 - 4.0 K/uL   Monocytes Absolute 0.8 0.1 - 1.0 K/uL   Eosinophils Absolute 0.4 0.0 - 0.7 K/uL   Basophils Absolute 0.1 0.0 - 0.1 K/uL  VITAMIN D 25 Hydroxy (Vit-D Deficiency, Fractures)  Result Value Ref Range   VITD 21.25 (L) 30.00 - 100.00 ng/mL  PSA  Result Value Ref Range   PSA 0.63 0.10 - 4.00 ng/mL  Comprehensive metabolic panel  Result Value Ref Range   Sodium 142 135 - 145 mEq/L   Potassium 4.3 3.5 - 5.1 mEq/L   Chloride 105 96 - 112 mEq/L   CO2 26 19 - 32 mEq/L   Glucose, Bld 133 (H) 70 - 99 mg/dL   BUN 29 (H) 6 - 23  mg/dL   Creatinine, Ser 0.10 0.40 - 1.50 mg/dL   Total Bilirubin 0.3 0.2 - 1.2 mg/dL   Alkaline Phosphatase 61 39 - 117 U/L   AST 21 0 - 37 U/L   ALT 29 0 - 53 U/L   Total Protein 6.5 6.0 - 8.3 g/dL   Albumin 4.2 3.5 - 5.2 g/dL   GFR 27.25 (L) >36.64 mL/min   Calcium 8.9 8.4 - 10.5 mg/dL  Lipid panel  Result Value Ref Range   Cholesterol 103 0 - 200 mg/dL   Triglycerides 403.4 (H) 0.0 - 149.0 mg/dL   HDL 74.25 (L) >95.63 mg/dL   VLDL 87.5 (H) 0.0 - 64.3 mg/dL   Total CHOL/HDL Ratio 3    NonHDL 72.60   Microalbumin / creatinine urine ratio  Result Value Ref Range   Microalb, Ur 1.9 0.0 - 1.9 mg/dL   Creatinine,U 329.5 mg/dL   Microalb Creat Ratio 0.4 0.0 - 30.0 mg/g  Hemoglobin A1c  Result Value Ref Range   Hgb A1c MFr Bld 6.6 (H) 4.6 - 6.5 %  LDL cholesterol, direct  Result Value Ref Range   Direct LDL 53.0 mg/dL    Assessment & Plan:   Problem List Items Addressed This Visit     Health maintenance examination - Primary (Chronic)    Preventative protocols reviewed and updated unless pt declined. Discussed healthy diet and lifestyle.       Essential hypertension    Chronic, stable period on current regimen - continue this.       Relevant  Medications   atorvastatin (LIPITOR) 10 MG tablet   valsartan-hydrochlorothiazide (DIOVAN-HCT) 160-12.5 MG tablet   GAD (generalized anxiety disorder)    Chronic, stable period on lexapro 20mg  daily - continue.       Relevant Medications   escitalopram (LEXAPRO) 20 MG tablet   Obesity, Class I, BMI 30-34.9    Encourage healthy diet and lifestyle choices to affect sustainable weight loss.  Increase ozempic to 1mg  weekly, reassess at 6 mo DM f/u visit.       Left leg numbness   Relevant Medications   gabapentin (NEURONTIN) 300 MG capsule   Type 2 diabetes mellitus with ophthalmic complication, without long-term current use of insulin (HCC)    Chronic, overall stable on low dose metformin (titration limited by loose stools)  and ozempic 0.5mg  weekly. Will increase ozempic to 1mg  weekly, reviewed side effects to monitor for.       Relevant Medications   atorvastatin (LIPITOR) 10 MG tablet   metFORMIN (GLUCOPHAGE-XR) 500 MG 24 hr tablet   valsartan-hydrochlorothiazide (DIOVAN-HCT) 160-12.5 MG tablet   Semaglutide, 1 MG/DOSE, 4 MG/3ML SOPN   Dyslipidemia associated with type 2 diabetes mellitus (HCC)    Chronic LDL at goal on low dose atorvastatin. Triglycerides elevated - discussed diet choices to improve this The ASCVD Risk score (Arnett DK, et al., 2019) failed to calculate for the following reasons:   The valid total cholesterol range is 130 to 320 mg/dL       Relevant Medications   atorvastatin (LIPITOR) 10 MG tablet   metFORMIN (GLUCOPHAGE-XR) 500 MG 24 hr tablet   valsartan-hydrochlorothiazide (DIOVAN-HCT) 160-12.5 MG tablet   Semaglutide, 1 MG/DOSE, 4 MG/3ML SOPN   CKD stage 3 due to type 2 diabetes mellitus (HCC)    Discussed CKD and importance of blood pressure and sugar control - rec start vit D 1000 international units daily. Reassess at f/u visit.       Relevant Medications   atorvastatin (LIPITOR) 10 MG tablet   metFORMIN (GLUCOPHAGE-XR) 500 MG 24 hr tablet   valsartan-hydrochlorothiazide (DIOVAN-HCT) 160-12.5 MG tablet   Semaglutide, 1 MG/DOSE, 4 MG/3ML SOPN   NAFLD (nonalcoholic fatty liver disease)   Vitamin D deficiency    Has not been replacing  Start vit D 1000 international units  daily.       Impacted cerumen of right ear    Hearing preserved. S/p ear irrigation in office today followed by cerumen disimpaction with plastic curette - successful, pt tolerated well, with resolution of R ear fullness sensation.      Other Visit Diagnoses     Encounter for immunization       Relevant Orders   Flu vaccine trivalent PF, 6mos and older(Flulaval,Afluria,Fluarix,Fluzone) (Completed)        Meds ordered this encounter  Medications   atorvastatin (LIPITOR) 10 MG tablet    Sig:  Take 1 tablet (10 mg total) by mouth daily.    Dispense:  90 tablet    Refill:  4   escitalopram (LEXAPRO) 20 MG tablet    Sig: Take 1 tablet (20 mg total) by mouth daily.    Dispense:  90 tablet    Refill:  4   gabapentin (NEURONTIN) 300 MG capsule    Sig: TAKE 1 CAPSULE BY MOUTH EVERYDAY AT BEDTIME    Dispense:  90 capsule    Refill:  4   metFORMIN (GLUCOPHAGE-XR) 500 MG 24 hr tablet    Sig: Take 1 tablet (500 mg total) by mouth daily.  Dispense:  90 tablet    Refill:  4   valsartan-hydrochlorothiazide (DIOVAN-HCT) 160-12.5 MG tablet    Sig: Take 1 tablet by mouth daily.    Dispense:  90 tablet    Refill:  4   Semaglutide, 1 MG/DOSE, 4 MG/3ML SOPN    Sig: Inject 1 mg as directed once a week.    Dispense:  9 mL    Refill:  3    Note new dose    Orders Placed This Encounter  Procedures   Flu vaccine trivalent PF, 6mos and older(Flulaval,Afluria,Fluarix,Fluzone)    Patient Instructions  Flu shot today  Consider RSV vaccine at local pharmacy if interested  Increase ozempic to 1mg  weekly Start vitamin D3 1000 units daily.  Return in 6 months for diabetes follow up visit  Follow up plan: Return in about 6 months (around 02/06/2024), or if symptoms worsen or fail to improve, for follow up visit.  Eustaquio Boyden, MD

## 2023-08-09 NOTE — Assessment & Plan Note (Signed)
Chronic, stable period on lexapro 20mg  daily - continue.

## 2023-08-09 NOTE — Assessment & Plan Note (Signed)
Chronic, stable period on current regimen - continue this.

## 2023-08-09 NOTE — Assessment & Plan Note (Signed)
Encourage healthy diet and lifestyle choices to affect sustainable weight loss.  Increase ozempic to 1mg  weekly, reassess at 6 mo DM f/u visit.

## 2023-08-09 NOTE — Assessment & Plan Note (Signed)
Chronic, overall stable on low dose metformin (titration limited by loose stools) and ozempic 0.5mg  weekly. Will increase ozempic to 1mg  weekly, reviewed side effects to monitor for.

## 2023-08-09 NOTE — Assessment & Plan Note (Signed)
Has not been replacing  Start vit D 1000 international units  daily.

## 2023-08-09 NOTE — Assessment & Plan Note (Addendum)
Hearing preserved. S/p ear irrigation in office today followed by cerumen disimpaction with plastic curette - successful, pt tolerated well, with resolution of R ear fullness sensation.

## 2023-10-10 DIAGNOSIS — Z23 Encounter for immunization: Secondary | ICD-10-CM | POA: Diagnosis not present

## 2023-11-07 ENCOUNTER — Encounter: Payer: Self-pay | Admitting: Family Medicine

## 2023-11-07 ENCOUNTER — Ambulatory Visit: Payer: BC Managed Care – PPO | Admitting: Family Medicine

## 2023-11-07 VITALS — BP 124/82 | HR 76 | Temp 97.9°F | Ht 66.5 in | Wt 210.4 lb

## 2023-11-07 DIAGNOSIS — L989 Disorder of the skin and subcutaneous tissue, unspecified: Secondary | ICD-10-CM | POA: Insufficient documentation

## 2023-11-07 NOTE — Patient Instructions (Addendum)
We will refer you to [facility] dermatology for evaluation.  You may call them for appointment  [phone]

## 2023-11-07 NOTE — Progress Notes (Signed)
Ph: [phone] Fax: [phone]   Patient ID: Jermaine Harris, male    DOB: [birth year], 62 y.o.   MRN: [id number]  This visit was conducted in person.  BP 124/82   Pulse 76   Temp 97.9 F (36.6 C) (Oral)   Ht 5' 6.5" (1.689 m)   Wt 210 lb 6 oz (95.4 kg)   SpO2 94%   BMI 33.45 kg/m    CC: check spot on forehead Subjective:   HPI: Jermaine Harris is a 62 y.o. male presenting on 11/07/2023 for Skin Problem (C/o lesion on R forehead. Noticed about 15 yrs ago. Area has increased in size and is now itchy. )   Longstanding lesion to R forehead, present for ~15 yrs.  Feels it is changing/enlarging, more bumpy and itchy.      Relevant past medical, surgical, family and social history reviewed and updated as indicated. Interim medical history since our last visit reviewed. Allergies and medications reviewed and updated. Outpatient Medications Prior to Visit  Medication Sig Dispense Refill   ALPRAZolam (NIRAVAM) 0.5 MG dissolvable tablet Take 1 tablet (0.5 mg total) by mouth daily as needed for anxiety (plane rides). 15 tablet 0   atorvastatin (LIPITOR) 10 MG tablet Take 1 tablet (10 mg total) by mouth daily. 90 tablet 4   escitalopram (LEXAPRO) 20 MG tablet Take 1 tablet (20 mg total) by mouth daily. 90 tablet 4   gabapentin (NEURONTIN) 300 MG capsule TAKE 1 CAPSULE BY MOUTH EVERYDAY AT BEDTIME 90 capsule 4   ibuprofen (ADVIL,MOTRIN) 200 MG tablet Take 200 mg by mouth as needed.     metFORMIN (GLUCOPHAGE-XR) 500 MG 24 hr tablet Take 1 tablet (500 mg total) by mouth daily. 90 tablet 4   Semaglutide, 1 MG/DOSE, 4 MG/3ML SOPN Inject 1 mg as directed once a week. 9 mL 3   valsartan-hydrochlorothiazide (DIOVAN-HCT) 160-12.5 MG tablet Take 1 tablet by mouth daily. 90 tablet 4   No facility-administered medications prior to visit.     Per HPI unless specifically indicated in ROS section below Review of Systems  Objective:  BP 124/82   Pulse 76   Temp 97.9 F (36.6 C) (Oral)    Ht 5' 6.5" (1.689 m)   Wt 210 lb 6 oz (95.4 kg)   SpO2 94%   BMI 33.45 kg/m   Wt Readings from Last 3 Encounters:  11/07/23 210 lb 6 oz (95.4 kg)  08/09/23 209 lb 2 oz (94.9 kg)  03/06/23 209 lb (94.8 kg)      Physical Exam Vitals and nursing note reviewed.  Constitutional:      Appearance: Normal appearance. He is not ill-appearing.  Skin:    General: Skin is warm and dry.     Findings: Lesion present. No rash.     Comments: 1cm x 0.8cm waxy growth to R forehead  (see pic)  Neurological:     Mental Status: He is alert.         Lab Results  Component Value Date   HGBA1C 6.6 (H) 08/02/2023    Assessment & Plan:   Problem List Items Addressed This Visit     Skin lesion - Primary    Suspect benign SK although it is changing.  Will refer to derm for further evaluation.       Relevant Orders   Ambulatory referral to Dermatology     No orders of the defined types were placed in this encounter.   Orders Placed This Encounter  Procedures  Ambulatory referral to Dermatology    Referral Priority:   Routine    Referral Reason:   Specialty Services Required    Requested Specialty:   Dermatology    Number of Visits Requested:   1    Patient Instructions  We will refer you to [facility] for evaluation.  You may call them for appointment  [phone]  Follow up plan: Return if symptoms worsen or fail to improve.  Jermaine Boyden, MD

## 2023-11-07 NOTE — Assessment & Plan Note (Signed)
Suspect benign SK although it is changing.  Will refer to derm for further evaluation.

## 2024-01-08 ENCOUNTER — Encounter: Payer: Self-pay | Admitting: Dermatology

## 2024-01-08 ENCOUNTER — Ambulatory Visit: Payer: BC Managed Care – PPO | Admitting: Dermatology

## 2024-01-08 DIAGNOSIS — L820 Inflamed seborrheic keratosis: Secondary | ICD-10-CM

## 2024-01-08 DIAGNOSIS — D492 Neoplasm of unspecified behavior of bone, soft tissue, and skin: Secondary | ICD-10-CM

## 2024-01-08 NOTE — Patient Instructions (Addendum)
Wound Care Instructions  Cleanse wound gently with soap and water once a day then pat dry with clean gauze. Apply a thin coat of Petrolatum (petroleum jelly, "Vaseline") over the wound (unless you have an allergy to this). We recommend that you use a new, sterile tube of Vaseline. Do not pick or remove scabs. Do not remove the yellow or white "healing tissue" from the base of the wound.  Cover the wound with fresh, clean, nonstick gauze and secure with paper tape. You may use Band-Aids in place of gauze and tape if the wound is small enough, but would recommend trimming much of the tape off as there is often too much. Sometimes Band-Aids can irritate the skin.  You should call the office for your biopsy report after 1 week if you have not already been contacted.  If you experience any problems, such as abnormal amounts of bleeding, swelling, significant bruising, significant pain, or evidence of infection, please call the office immediately.  FOR ADULT SURGERY PATIENTS: If you need something for pain relief you may take 1 extra strength Tylenol (acetaminophen) AND 2 Ibuprofen (200mg  each) together every 4 hours as needed for pain. (do not take these if you are allergic to them or if you have a reason you should not take them.) Typically, you may only need pain medication for 1 to 3 days.      Recommend daily broad spectrum sunscreen SPF 30+ to sun-exposed areas, reapply every 2 hours as needed. Call for new or changing lesions.  Staying in the shade or wearing long sleeves, sun glasses (UVA+UVB protection) and wide brim hats (4-inch brim around the entire circumference of the hat) are also recommended for sun protection.      Seborrheic Keratosis  What causes seborrheic keratoses? Seborrheic keratoses are harmless, common skin growths that first appear during adult life.  As time goes by, more growths appear.  Some people may develop a large number of them.  Seborrheic keratoses appear on  both covered and uncovered body parts.  They are not caused by sunlight.  The tendency to develop seborrheic keratoses can be inherited.  They vary in color from skin-colored to gray, brown, or even black.  They can be either smooth or have a rough, warty surface.   Seborrheic keratoses are superficial and look as if they were stuck on the skin.  Under the microscope this type of keratosis looks like layers upon layers of skin.  That is why at times the top layer may seem to fall off, but the rest of the growth remains and re-grows.    Treatment Seborrheic keratoses do not need to be treated, but can easily be removed in the office.  Seborrheic keratoses often cause symptoms when they rub on clothing or jewelry.  Lesions can be in the way of shaving.  If they become inflamed, they can cause itching, soreness, or burning.  Removal of a seborrheic keratosis can be accomplished by freezing, burning, or surgery. If any spot bleeds, scabs, or grows rapidly, please return to have it checked, as these can be an indication of a skin cancer.     Due to recent changes in healthcare laws, you may see results of your pathology and/or laboratory studies on MyChart before the doctors have had a chance to review them. We understand that in some cases there may be results that are confusing or concerning to you. Please understand that not all results are received at the same time and often  the doctors may need to interpret multiple results in order to provide you with the best plan of care or course of treatment. Therefore, we ask that you please give [place] 2 business days to thoroughly review all your results before contacting the office for clarification. Should we see a critical lab result, you will be contacted sooner.   If You Need Anything After Your Visit  If you have any questions or concerns for your doctor, please call our main line at [phone] and press option 4 to reach your doctor's medical assistant.  If no one answers, please leave a voicemail as directed and we will return your call as soon as possible. Messages left after 4 pm will be answered the following business day.   You may also send [place] a message via MyChart. We typically respond to MyChart messages within 1-2 business days.  For prescription refills, please ask your pharmacy to contact our office. Our fax number is [phone].  If you have an urgent issue when the clinic is closed that cannot wait until the next business day, you can page your doctor at the number below.    Please note that while we do our best to be available for urgent issues outside of office hours, we are not available 24/7.   If you have an urgent issue and are unable to reach [place], you may choose to seek medical care at your doctor's office, retail clinic, urgent care center, or emergency room.  If you have a medical emergency, please immediately call 911 or go to the emergency department.  Pager Numbers  - Dr. Gwen Pounds: [phone]  - Dr. Roseanne Reno: [phone]  - Dr. Katrinka Blazing: [phone]   In the event of inclement weather, please call our main line at [phone] for an update on the status of any delays or closures.  Dermatology Medication Tips: Please keep the boxes that topical medications come in in order to help keep track of the instructions about where and how to use these. Pharmacies typically print the medication instructions only on the boxes and not directly on the medication tubes.   If your medication is too expensive, please contact our office at [phone] option 4 or send [place] a message through MyChart.   We are unable to tell what your co-pay for medications will be in advance as this is different depending on your insurance coverage. However, we may be able to find a substitute medication at lower cost or fill out paperwork to get insurance to cover a needed medication.   If a prior authorization is required to get your  medication covered by your insurance company, please allow [place] 1-2 business days to complete this process.  Drug prices often vary depending on where the prescription is filled and some pharmacies may offer cheaper prices.  The website www.goodrx.com contains coupons for medications through different pharmacies. The prices here do not account for what the cost may be with help from insurance (it may be cheaper with your insurance), but the website can give you the price if you did not use any insurance.  - You can print the associated coupon and take it with your prescription to the pharmacy.  - You may also stop by our office during regular business hours and pick up a GoodRx coupon card.  - If you need your prescription sent electronically to a different pharmacy, notify our office through [facility] or by phone at [phone] option 4.     Si Usted  Necesita Algo Despus de Su Visita  Tambin puede enviarnos un mensaje a travs de [place]. Por lo general respondemos a los mensajes de MyChart en el transcurso de 1 a 2 das hbiles.  Para renovar recetas, por favor pida a su farmacia que se ponga en contacto con nuestra oficina. Annie Sable de fax es [place] [phone].  Si tiene un asunto urgente cuando la clnica est cerrada y que no puede esperar hasta el siguiente da hbil, puede llamar/localizar a su doctor(a) al nmero que aparece a continuacin.   Por favor, tenga en cuenta que aunque hacemos todo lo posible para estar disponibles para asuntos urgentes fuera del horario de [place], no estamos disponibles las 24 horas del da, los 7 [address]  [address] de la [place].   Si tiene un problema urgente y no puede comunicarse con nosotros, puede optar por buscar atencin mdica  en el consultorio de su doctor(a), en una clnica privada, en un centro de atencin urgente o en una sala de emergencias.  Si tiene Engineer, drilling, por favor llame inmediatamente al 911 o vaya a la sala de  emergencias.  Nmeros de bper  - Dr. Gwen Pounds: [phone]  - Dra. Roseanne Reno: [phone]  - Dr. Katrinka Blazing: [phone]   En caso de inclemencias del tiempo, por favor llame a Lacy Duverney principal al [phone] para una actualizacin sobre el [place] de cualquier retraso o cierre.  Consejos para la medicacin en dermatologa: Por favor, guarde las cajas en las que vienen los medicamentos de uso tpico para ayudarle a seguir las instrucciones sobre dnde y cmo usarlos. Las farmacias generalmente imprimen las instrucciones del medicamento slo en las cajas y no directamente en los tubos del [place].   Si su medicamento es muy caro, por favor, pngase en contacto con Rolm Gala llamando al [phone] y presione la opcin 4 o envenos un mensaje a travs de Clinical cytogeneticist.   No podemos decirle cul ser su copago por los medicamentos por adelantado ya que esto es diferente dependiendo de la cobertura de su seguro. Sin embargo, es posible que podamos encontrar un medicamento sustituto a Audiological scientist un formulario para que el seguro cubra el medicamento que se considera necesario.   Si se requiere una autorizacin previa para que su compaa de seguros [place] su medicamento, por favor permtanos de 1 a 2 das hbiles para completar [address]  Los precios de los medicamentos varan con frecuencia dependiendo del Environmental consultant de dnde se surte la receta y alguna farmacias pueden ofrecer precios ms baratos.  El sitio web www.goodrx.com tiene cupones para medicamentos de Health and safety inspector. Los precios aqu no tienen en cuenta lo que podra costar con la ayuda del seguro (puede ser ms barato con su seguro), pero el sitio web puede darle el precio si no utiliz Tourist information centre manager.  - Puede imprimir el cupn correspondiente y llevarlo con su receta a la farmacia.  - Tambin puede pasar por nuestra oficina durante el horario de atencin regular y Education officer, museum una tarjeta de cupones de GoodRx.  - Si  necesita que su receta se enve electrnicamente a una farmacia diferente, informe a nuestra oficina a travs de MyChart de [place] o por telfono llamando al [phone] y presione la opcin 4.

## 2024-01-08 NOTE — Progress Notes (Signed)
   New Patient Visit   Subjective  Jermaine Harris is a 63 y.o. male who presents for the following: Spot at right frontal scalp. Has been there for several years. Was flat. Has become raised, rough. Bothersome, itchy. No personal Hx of skin cancer.   The patient has spots, moles and lesions to be evaluated, some may be new or changing and the patient may have concern these could be cancer.    The following portions of the chart were reviewed this encounter and updated as appropriate: medications, allergies, medical history  Review of Systems:  No other skin or systemic complaints except as noted in HPI or Assessment and Plan.  Objective  Well appearing patient in no apparent distress; mood and affect are within normal limits.  A focused examination was performed of the following areas: Scalp, face  Relevant physical exam findings are noted in the Assessment and Plan.  Right Frontal Scalp 1 cm pink cerebriform plaque   Assessment & Plan   NEOPLASM OF SKIN Right Frontal Scalp Epidermal / dermal shaving  Lesion diameter (cm):  1 Informed consent: discussed and consent obtained   Timeout: patient name, date of birth, surgical site, and procedure verified   Procedure prep:  Patient was prepped and draped in usual sterile fashion Prep type:  Povidone-iodine and isopropyl alcohol Anesthesia: the lesion was anesthetized in a standard fashion   Anesthetic:  1% lidocaine w/ epinephrine 1-100,000 buffered w/ 8.4% NaHCO3 Instrument used: DermaBlade   Hemostasis achieved with: pressure and aluminum chloride   Outcome: patient tolerated procedure well   Post-procedure details: wound care instructions given   Specimen 1 - Surgical pathology Differential Diagnosis: Inflamed seborrheic keratosis  Check Margins: No   Return if symptoms worsen or fail to improve.  I, Lawson Radar, CMA, am acting as scribe for Elie Goody, MD.   Documentation: I have reviewed the above  documentation for accuracy and completeness, and I agree with the above.  Elie Goody, MD

## 2024-01-11 ENCOUNTER — Encounter: Payer: Self-pay | Admitting: Dermatology

## 2024-01-11 LAB — SURGICAL PATHOLOGY

## 2024-06-30 ENCOUNTER — Other Ambulatory Visit: Payer: Self-pay | Admitting: Family Medicine

## 2024-07-18 ENCOUNTER — Telehealth: Payer: Self-pay

## 2024-07-18 NOTE — Telephone Encounter (Signed)
 Please contact patient to schedule CPE in addition to fasting labs

## 2024-07-19 NOTE — Telephone Encounter (Signed)
 Lvmtcb, sent mychart message

## 2024-07-30 ENCOUNTER — Other Ambulatory Visit: Payer: Self-pay | Admitting: Family Medicine

## 2024-07-31 ENCOUNTER — Encounter: Payer: Self-pay | Admitting: Family Medicine

## 2024-08-11 ENCOUNTER — Other Ambulatory Visit: Payer: Self-pay | Admitting: Family Medicine

## 2024-08-11 DIAGNOSIS — I10 Essential (primary) hypertension: Secondary | ICD-10-CM

## 2024-08-17 ENCOUNTER — Other Ambulatory Visit: Payer: Self-pay | Admitting: Family Medicine

## 2024-08-17 DIAGNOSIS — F411 Generalized anxiety disorder: Secondary | ICD-10-CM

## 2024-08-17 DIAGNOSIS — E1136 Type 2 diabetes mellitus with diabetic cataract: Secondary | ICD-10-CM

## 2024-08-17 DIAGNOSIS — E1169 Type 2 diabetes mellitus with other specified complication: Secondary | ICD-10-CM

## 2024-08-19 NOTE — Telephone Encounter (Signed)
 ERx

## 2024-08-21 ENCOUNTER — Telehealth: Payer: Self-pay

## 2024-08-21 DIAGNOSIS — E1136 Type 2 diabetes mellitus with diabetic cataract: Secondary | ICD-10-CM

## 2024-08-21 DIAGNOSIS — E1169 Type 2 diabetes mellitus with other specified complication: Secondary | ICD-10-CM

## 2024-08-21 DIAGNOSIS — N183 Chronic kidney disease, stage 3 unspecified: Secondary | ICD-10-CM

## 2024-08-21 DIAGNOSIS — Z125 Encounter for screening for malignant neoplasm of prostate: Secondary | ICD-10-CM

## 2024-08-21 DIAGNOSIS — E559 Vitamin D deficiency, unspecified: Secondary | ICD-10-CM

## 2024-08-21 NOTE — Telephone Encounter (Signed)
 Copied from CRM [id number]. Topic: Appointments - Scheduling Inquiry for Clinic >> Aug 21, 2024 10:43 AM Mesmerise C wrote: Reason for CRM: Patient inquiring if for his physical if fasting labs are to be done

## 2024-08-21 NOTE — Telephone Encounter (Signed)
 Left message to return call to our office.

## 2024-08-22 NOTE — Telephone Encounter (Signed)
 Ordered labs

## 2024-08-22 NOTE — Telephone Encounter (Unsigned)
 Copied from CRM [id number]. Topic: General - Other >> Aug 22, 2024  8:49 AM Donna BRAVO wrote: Reason for CRM: patient returning call from Sebastian Danna GRADE, CMA regarding fasting labs

## 2024-08-22 NOTE — Telephone Encounter (Signed)
 Patient is scheduled for CPE labs please put orders in

## 2024-08-22 NOTE — Addendum Note (Signed)
 Addended by: RILLA BALLER on: 08/22/2024 11:13 PM   Modules accepted: Orders

## 2024-08-24 ENCOUNTER — Other Ambulatory Visit: Payer: Self-pay | Admitting: Family Medicine

## 2024-08-24 DIAGNOSIS — R200 Anesthesia of skin: Secondary | ICD-10-CM

## 2024-08-25 ENCOUNTER — Other Ambulatory Visit: Payer: Self-pay | Admitting: Family Medicine

## 2024-08-25 DIAGNOSIS — E1136 Type 2 diabetes mellitus with diabetic cataract: Secondary | ICD-10-CM

## 2024-08-26 NOTE — Telephone Encounter (Signed)
 Ozempic  Last filled:  07/30/24, #30 mL Last OV:  11/07/23, skin lesion Next OV:  09/03/24, CPE

## 2024-08-26 NOTE — Telephone Encounter (Signed)
 ERx

## 2024-08-27 ENCOUNTER — Other Ambulatory Visit: Payer: Self-pay | Admitting: Medical Genetics

## 2024-08-27 ENCOUNTER — Other Ambulatory Visit (INDEPENDENT_AMBULATORY_CARE_PROVIDER_SITE_OTHER)

## 2024-08-27 DIAGNOSIS — N183 Chronic kidney disease, stage 3 unspecified: Secondary | ICD-10-CM

## 2024-08-27 DIAGNOSIS — E559 Vitamin D deficiency, unspecified: Secondary | ICD-10-CM | POA: Diagnosis not present

## 2024-08-27 DIAGNOSIS — E785 Hyperlipidemia, unspecified: Secondary | ICD-10-CM | POA: Diagnosis not present

## 2024-08-27 DIAGNOSIS — Z125 Encounter for screening for malignant neoplasm of prostate: Secondary | ICD-10-CM

## 2024-08-27 DIAGNOSIS — E1122 Type 2 diabetes mellitus with diabetic chronic kidney disease: Secondary | ICD-10-CM

## 2024-08-27 DIAGNOSIS — E1169 Type 2 diabetes mellitus with other specified complication: Secondary | ICD-10-CM | POA: Diagnosis not present

## 2024-08-27 DIAGNOSIS — E1136 Type 2 diabetes mellitus with diabetic cataract: Secondary | ICD-10-CM | POA: Diagnosis not present

## 2024-08-27 LAB — CBC WITH DIFFERENTIAL/PLATELET
Basophils Absolute: 0.1 K/uL (ref 0.0–0.1)
Basophils Relative: 1.2 % (ref 0.0–3.0)
Eosinophils Absolute: 0.4 K/uL (ref 0.0–0.7)
Eosinophils Relative: 5.7 % — ABNORMAL HIGH (ref 0.0–5.0)
HCT: 40.6 % (ref 39.0–52.0)
Hemoglobin: 13.3 g/dL (ref 13.0–17.0)
Lymphocytes Relative: 29.3 % (ref 12.0–46.0)
Lymphs Abs: 2 K/uL (ref 0.7–4.0)
MCHC: 32.8 g/dL (ref 30.0–36.0)
MCV: 84.8 fl (ref 78.0–100.0)
Monocytes Absolute: 0.7 K/uL (ref 0.1–1.0)
Monocytes Relative: 10.6 % (ref 3.0–12.0)
Neutro Abs: 3.6 K/uL (ref 1.4–7.7)
Neutrophils Relative %: 53.2 % (ref 43.0–77.0)
Platelets: 250 K/uL (ref 150.0–400.0)
RBC: 4.78 Mil/uL (ref 4.22–5.81)
RDW: 14.1 % (ref 11.5–15.5)
WBC: 6.8 K/uL (ref 4.0–10.5)

## 2024-08-27 LAB — LIPID PANEL
Cholesterol: 104 mg/dL (ref 0–200)
HDL: 32.9 mg/dL — ABNORMAL LOW (ref >39.00)
LDL Cholesterol: 30 mg/dL (ref 0–99)
NonHDL: 71
Total CHOL/HDL Ratio: 3
Triglycerides: 206 mg/dL — ABNORMAL HIGH (ref 0.0–149.0)
VLDL: 41.2 mg/dL — ABNORMAL HIGH (ref 0.0–40.0)

## 2024-08-27 LAB — COMPREHENSIVE METABOLIC PANEL WITH GFR
ALT: 36 U/L (ref 0–53)
AST: 24 U/L (ref 0–37)
Albumin: 4.6 g/dL (ref 3.5–5.2)
Alkaline Phosphatase: 58 U/L (ref 39–117)
BUN: 32 mg/dL — ABNORMAL HIGH (ref 6–23)
CO2: 27 meq/L (ref 19–32)
Calcium: 9.4 mg/dL (ref 8.4–10.5)
Chloride: 102 meq/L (ref 96–112)
Creatinine, Ser: 1.46 mg/dL (ref 0.40–1.50)
GFR: 50.54 mL/min — ABNORMAL LOW (ref >60.00)
Glucose, Bld: 136 mg/dL — ABNORMAL HIGH (ref 70–99)
Potassium: 4.6 meq/L (ref 3.5–5.1)
Sodium: 139 meq/L (ref 135–145)
Total Bilirubin: 0.4 mg/dL (ref 0.2–1.2)
Total Protein: 7.1 g/dL (ref 6.0–8.3)

## 2024-08-27 LAB — HEMOGLOBIN A1C: Hgb A1c MFr Bld: 6.7 % — ABNORMAL HIGH (ref 4.6–6.5)

## 2024-08-27 LAB — PSA: PSA: 0.57 ng/mL (ref 0.10–4.00)

## 2024-08-27 LAB — VITAMIN D 25 HYDROXY (VIT D DEFICIENCY, FRACTURES): VITD: 17.06 ng/mL — ABNORMAL LOW (ref 30.00–100.00)

## 2024-08-27 LAB — MICROALBUMIN / CREATININE URINE RATIO
Creatinine,U: 256.5 mg/dL
Microalb Creat Ratio: 6.7 mg/g (ref 0.0–30.0)
Microalb, Ur: 1.7 mg/dL (ref 0.0–1.9)

## 2024-08-27 LAB — PHOSPHORUS: Phosphorus: 4.5 mg/dL (ref 2.3–4.6)

## 2024-08-27 LAB — VITAMIN B12: Vitamin B-12: 269 pg/mL (ref 211–911)

## 2024-08-28 ENCOUNTER — Ambulatory Visit: Payer: Self-pay | Admitting: Family Medicine

## 2024-08-28 LAB — PARATHYROID HORMONE, INTACT (NO CA): PTH: 24 pg/mL (ref 16–77)

## 2024-08-29 NOTE — Telephone Encounter (Signed)
 ERx

## 2024-08-30 ENCOUNTER — Other Ambulatory Visit
Admission: RE | Admit: 2024-08-30 | Discharge: 2024-08-30 | Disposition: A | Discharge status: Home / Self Care | Payer: Self-pay | Source: Ambulatory Visit | Attending: Medical Genetics | Admitting: Medical Genetics

## 2024-09-03 ENCOUNTER — Encounter: Admitting: Family Medicine

## 2024-09-03 ENCOUNTER — Ambulatory Visit (INDEPENDENT_AMBULATORY_CARE_PROVIDER_SITE_OTHER): Admitting: Family Medicine

## 2024-09-03 ENCOUNTER — Encounter: Payer: Self-pay | Admitting: Family Medicine

## 2024-09-03 VITALS — BP 128/80 | HR 87 | Temp 98.6°F | Ht 67.0 in | Wt 203.5 lb

## 2024-09-03 DIAGNOSIS — F411 Generalized anxiety disorder: Secondary | ICD-10-CM

## 2024-09-03 DIAGNOSIS — Z23 Encounter for immunization: Secondary | ICD-10-CM

## 2024-09-03 DIAGNOSIS — K429 Umbilical hernia without obstruction or gangrene: Secondary | ICD-10-CM | POA: Insufficient documentation

## 2024-09-03 DIAGNOSIS — Z0000 Encounter for general adult medical examination without abnormal findings: Secondary | ICD-10-CM

## 2024-09-03 DIAGNOSIS — K760 Fatty (change of) liver, not elsewhere classified: Secondary | ICD-10-CM

## 2024-09-03 DIAGNOSIS — R200 Anesthesia of skin: Secondary | ICD-10-CM

## 2024-09-03 DIAGNOSIS — E1169 Type 2 diabetes mellitus with other specified complication: Secondary | ICD-10-CM

## 2024-09-03 DIAGNOSIS — E785 Hyperlipidemia, unspecified: Secondary | ICD-10-CM

## 2024-09-03 DIAGNOSIS — E1122 Type 2 diabetes mellitus with diabetic chronic kidney disease: Secondary | ICD-10-CM

## 2024-09-03 DIAGNOSIS — Z1211 Encounter for screening for malignant neoplasm of colon: Secondary | ICD-10-CM

## 2024-09-03 DIAGNOSIS — Z7985 Long-term (current) use of injectable non-insulin antidiabetic drugs: Secondary | ICD-10-CM

## 2024-09-03 DIAGNOSIS — K580 Irritable bowel syndrome with diarrhea: Secondary | ICD-10-CM

## 2024-09-03 DIAGNOSIS — I10 Essential (primary) hypertension: Secondary | ICD-10-CM

## 2024-09-03 DIAGNOSIS — E538 Deficiency of other specified B group vitamins: Secondary | ICD-10-CM

## 2024-09-03 DIAGNOSIS — E1136 Type 2 diabetes mellitus with diabetic cataract: Secondary | ICD-10-CM

## 2024-09-03 DIAGNOSIS — E559 Vitamin D deficiency, unspecified: Secondary | ICD-10-CM

## 2024-09-03 DIAGNOSIS — N183 Chronic kidney disease, stage 3 unspecified: Secondary | ICD-10-CM

## 2024-09-03 MED ORDER — OZEMPIC (1 MG/DOSE) 4 MG/3ML ~~LOC~~ SOPN: 1.0000 mg | PEN_INJECTOR | SUBCUTANEOUS | 3 refills | Status: AC

## 2024-09-03 MED ORDER — GABAPENTIN 300 MG PO CAPS: ORAL_CAPSULE | ORAL | 3 refills | Status: AC

## 2024-09-03 MED ORDER — ESCITALOPRAM OXALATE 20 MG PO TABS: 20.0000 mg | ORAL_TABLET | Freq: Every day | ORAL | 3 refills | Status: AC

## 2024-09-03 MED ORDER — VALSARTAN-HYDROCHLOROTHIAZIDE 160-12.5 MG PO TABS: 1.0000 | ORAL_TABLET | Freq: Every day | ORAL | 3 refills | Status: AC

## 2024-09-03 MED ORDER — CYANOCOBALAMIN 500 MCG PO TABS: 500.0000 ug | ORAL_TABLET | ORAL | Status: AC

## 2024-09-03 MED ORDER — VITAMIN D3 25 MCG (1000 UT) PO CAPS: 1.0000 | ORAL_CAPSULE | Freq: Every day | ORAL | Status: AC

## 2024-09-03 NOTE — Assessment & Plan Note (Signed)
 LFTs remain stable on blood work.

## 2024-09-03 NOTE — Assessment & Plan Note (Signed)
 Has not been regular with replacement - start 1000 units daily.

## 2024-09-03 NOTE — Assessment & Plan Note (Signed)
 Reviewed dx with patient.  Will continue monitoring this.

## 2024-09-03 NOTE — Assessment & Plan Note (Signed)
 Low normal range on metformin  - rec start b12 500mcg MWF dosing OTC.

## 2024-09-03 NOTE — Assessment & Plan Note (Signed)
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 Continues gabapentin 300mg  nightly with benefit.

## 2024-09-03 NOTE — Assessment & Plan Note (Signed)
Chronic, stable period on lexapro 20mg  daily - continue.

## 2024-09-03 NOTE — Assessment & Plan Note (Signed)
 Preventative protocols reviewed and updated unless pt declined. Discussed healthy diet and lifestyle.

## 2024-09-03 NOTE — Patient Instructions (Addendum)
 Prevnar-20 today  Flu shot today  For IBS type symptoms - increase fiber and waters. Exclude gas producing foods (beans, onions, celery, carrots, raisins, bananas, apricots, prunes, brussel sprouts, wheat germ, pretzels). Consider trail of lactose free diet (back off milk). Trial of align for IBS symptoms (OTC), or phillips colon health brand probiotic. Let us  know if worsening to consider stopping metformin  XR.  Start b12 and D supplementation as per updated med list Return in 6 months for diabetes follow up visit.

## 2024-09-03 NOTE — Assessment & Plan Note (Signed)
 Discussed suspect etiology of symptoms - IBS-D. Declines medication for cramping. Will start probiotic daily ie align.

## 2024-09-03 NOTE — Assessment & Plan Note (Signed)
 Chronic, stable period on diovan  hct with good BP control - discussed importance of avoid dehydration status to keep kidneys healthy and hydrated with these meds. If progressive kidney disease noted, low threshold to stop hydrochlorothiazide  component.

## 2024-09-03 NOTE — Assessment & Plan Note (Signed)
 Chronic, stable period on low dose metformin  XR and ozempic  1mg  weekly - continue.  Consider stopping metformin  XR if worsening loose stools.

## 2024-09-03 NOTE — Assessment & Plan Note (Signed)
 Chronic, great LDL control on low dose atorvastatin , but triglycerides remain elevated. Encouraged diet choices to improve trig control. The ASCVD Risk score (Arnett DK, et al., 2019) failed to calculate for the following reasons:   The valid total cholesterol range is 130 to 320 mg/dL

## 2024-09-03 NOTE — Assessment & Plan Note (Signed)
 Tiny - doubt it will cause trouble - will continue to monitor.

## 2024-09-03 NOTE — Progress Notes (Signed)
 Ph: [phone] Fax: [phone]   Patient ID: Jermaine Harris, male    DOB: [birth year], 63 y.o.   MRN: [id number]  This visit was conducted in person.  BP 128/80   Pulse 87   Temp 98.6 F (37 C) (Oral)   Ht 5' 7 (1.702 m)   Wt 203 lb 8 oz (92.3 kg)   SpO2 96%   BMI 31.87 kg/m   BP Readings from Last 3 Encounters:  09/03/24 128/80  11/07/23 124/82  08/09/23 126/82   CC: CPE  Subjective:   HPI: Jermaine Harris is a 63 y.o. male presenting on 09/03/2024 for No chief complaint on file.   Planning to retire from [employer] end of 04/2025.   Gabapentin  effective for neuropathic pain. Persistent L leg numbness after herniated disc s/p microdiscectomy L4/5/S1 (2016).    H/o legionella pneumonia.   Mood - continues lexapro  20mg  daily with good effect.   Notes ongoing issue with bowel urgency with loose stools, ongoing for years. ?IBS dx in the past. No blood in stool, significant abd pain or nausea/vomiting. Cramping that does improve after BM. Coffee may trigger symptoms. Rare stool accidents (last several years ago).  Mother had dumping syndrome after stomach surgery.   Some dizziness while walking 9 holes of golf in the summer heat - had to be driven back to his car. He had recently donated blood.   New umbilical herniation noted without pain.   Preventative: COLONOSCOPY WITH PROPOFOL  05/29/2019 - TA, diverticulosis, rpt 5 yrs (Tahiliani, Keene NOVAK, MD)  Prostate cancer screening - yearly screening. Nocturia x1. No fmhx prostate cancer Lung cancer screening - not eligible  Flu shot yearly COVID vaccine - Pfizer 02/2020 x2, booster x2 09/2020, 03/2021 Pneumovax 2019, prevnar-20 today  Td 2010, Tdap 12/2018.  RSV - discussed  Shingrix  - 06/2020, 02/2021 Got MMR at school this fall.  Advanced directive - doesn't have this. Would want wife to be HCPOA. To check on that at home  Seat belt use discussed Sunscreen use discussed, no changing moles on skin.  Sleep - averaging 7  hours/night Non smoker  Alcohol - 1 glass wine a few times a week  Dentist q6 mo Eye exam yearly  Bowel - no constipation   Divorced (2015) Remarried (2017) Occupation: Production manager Counsellor) Edu: PhD  Activity: golf, walking, yardwork Diet: good water, vegetables daily (salads)      Relevant past medical, surgical, family and social history reviewed and updated as indicated. Interim medical history since our last visit reviewed. Allergies and medications reviewed and updated. Outpatient Medications Prior to Visit  Medication Sig Dispense Refill   ALPRAZolam  (NIRAVAM ) 0.5 MG dissolvable tablet Take 1 tablet (0.5 mg total) by mouth daily as needed for anxiety (plane rides). 15 tablet 0   atorvastatin  (LIPITOR) 10 MG tablet TAKE 1 TABLET BY MOUTH EVERY DAY 90 tablet 3   ibuprofen  (ADVIL ,MOTRIN ) 200 MG tablet Take 200 mg by mouth as needed.     metFORMIN  (GLUCOPHAGE -XR) 500 MG 24 hr tablet TAKE 1 TABLET (500 MG TOTAL) BY MOUTH DAILY. 90 tablet 3   escitalopram  (LEXAPRO ) 20 MG tablet TAKE 1 TABLET BY MOUTH EVERY DAY 90 tablet 3   gabapentin  (NEURONTIN ) 300 MG capsule TAKE 1 CAPSULE BY MOUTH EVERYDAY AT BEDTIME 90 capsule 1   Semaglutide , 1 MG/DOSE, (OZEMPIC , 1 MG/DOSE,) 4 MG/3ML SOPN INJECT 1 MG ONCE A WEEK AS DIRECTED 9 mL 0   valsartan -hydrochlorothiazide  (DIOVAN -HCT) 160-12.5 MG tablet TAKE 1 TABLET BY MOUTH  EVERY DAY 90 tablet 0   No facility-administered medications prior to visit.     Per HPI unless specifically indicated in ROS section below Review of Systems  Constitutional:  Negative for activity change, appetite change, chills, fatigue, fever and unexpected weight change.  HENT:  Negative for hearing loss.   Eyes:  Negative for visual disturbance.  Respiratory:  Negative for cough, chest tightness, shortness of breath and wheezing.   Cardiovascular:  Negative for chest pain, palpitations and leg swelling.  Gastrointestinal:  Positive for diarrhea. Negative for  abdominal distention, abdominal pain, blood in stool, constipation, nausea and vomiting.  Genitourinary:  Negative for difficulty urinating and hematuria.  Musculoskeletal:  Negative for arthralgias, myalgias and neck pain.  Skin:  Negative for rash.  Neurological:  Positive for dizziness. Negative for seizures, syncope and headaches.  Hematological:  Negative for adenopathy. Does not bruise/bleed easily.  Psychiatric/Behavioral:  Negative for dysphoric mood. The patient is not nervous/anxious.     Objective:  BP 128/80   Pulse 87   Temp 98.6 F (37 C) (Oral)   Ht 5' 7 (1.702 m)   Wt 203 lb 8 oz (92.3 kg)   SpO2 96%   BMI 31.87 kg/m   Wt Readings from Last 3 Encounters:  09/03/24 203 lb 8 oz (92.3 kg)  11/07/23 210 lb 6 oz (95.4 kg)  08/09/23 209 lb 2 oz (94.9 kg)      Physical Exam Vitals and nursing note reviewed.  Constitutional:      General: He is not in acute distress.    Appearance: Normal appearance. He is well-developed. He is not ill-appearing.  HENT:     Head: Normocephalic and atraumatic.     Right Ear: Hearing, tympanic membrane, ear canal and external ear normal.     Left Ear: Hearing, tympanic membrane, ear canal and external ear normal.     Mouth/Throat:     Mouth: Mucous membranes are moist.     Pharynx: Oropharynx is clear. No oropharyngeal exudate or posterior oropharyngeal erythema.  Eyes:     General: No scleral icterus.    Extraocular Movements: Extraocular movements intact.     Conjunctiva/sclera: Conjunctivae normal.     Pupils: Pupils are equal, round, and reactive to light.  Neck:     Thyroid : No thyroid  mass or thyromegaly.  Cardiovascular:     Rate and Rhythm: Normal rate and regular rhythm.     Pulses: Normal pulses.          Radial pulses are 2+ on the right side and 2+ on the left side.     Heart sounds: Normal heart sounds. No murmur heard. Pulmonary:     Effort: Pulmonary effort is normal. No respiratory distress.     Breath sounds:  Normal breath sounds. No wheezing, rhonchi or rales.  Abdominal:     General: Bowel sounds are normal. There is no distension.     Palpations: Abdomen is soft. There is no mass.     Tenderness: There is no abdominal tenderness. There is no guarding or rebound.     Hernia: A hernia is present. Hernia is present in the umbilical area.     Comments:  Mild diastasis recti present Tiny umbilical hernia present without discomfort   Musculoskeletal:        General: Normal range of motion.     Cervical back: Normal range of motion and neck supple.     Right lower leg: No edema.     Left  lower leg: No edema.  Lymphadenopathy:     Cervical: No cervical adenopathy.  Skin:    General: Skin is warm and dry.     Findings: No rash.  Neurological:     General: No focal deficit present.     Mental Status: He is alert and oriented to person, place, and time.  Psychiatric:        Mood and Affect: Mood normal.        Behavior: Behavior normal.        Thought Content: Thought content normal.        Judgment: Judgment normal.       Results for orders placed or performed in visit on 08/27/24  PSA   Collection Time: 08/27/24  8:25 AM  Result Value Ref Range   PSA 0.57 0.10 - 4.00 ng/mL  Vitamin B12   Collection Time: 08/27/24  8:25 AM  Result Value Ref Range   Vitamin B-12 269 211 - 911 pg/mL  CBC with Differential/Platelet   Collection Time: 08/27/24  8:25 AM  Result Value Ref Range   WBC 6.8 4.0 - 10.5 K/uL   RBC 4.78 4.22 - 5.81 Mil/uL   Hemoglobin 13.3 13.0 - 17.0 g/dL   HCT 59.3 60.9 - 47.9 %   MCV 84.8 78.0 - 100.0 fl   MCHC 32.8 30.0 - 36.0 g/dL   RDW 85.8 88.4 - 84.4 %   Platelets 250.0 150.0 - 400.0 K/uL   Neutrophils Relative % 53.2 43.0 - 77.0 %   Lymphocytes Relative 29.3 12.0 - 46.0 %   Monocytes Relative 10.6 3.0 - 12.0 %   Eosinophils Relative 5.7 (H) 0.0 - 5.0 %   Basophils Relative 1.2 0.0 - 3.0 %   Neutro Abs 3.6 1.4 - 7.7 K/uL   Lymphs Abs 2.0 0.7 - 4.0 K/uL    Monocytes Absolute 0.7 0.1 - 1.0 K/uL   Eosinophils Absolute 0.4 0.0 - 0.7 K/uL   Basophils Absolute 0.1 0.0 - 0.1 K/uL  Parathyroid  hormone, intact (no Ca)   Collection Time: 08/27/24  8:25 AM  Result Value Ref Range   PTH 24 16 - 77 pg/mL  VITAMIN D  25 Hydroxy (Vit-D Deficiency, Fractures)   Collection Time: 08/27/24  8:25 AM  Result Value Ref Range   VITD 17.06 (L) 30.00 - 100.00 ng/mL  Phosphorus   Collection Time: 08/27/24  8:25 AM  Result Value Ref Range   Phosphorus 4.5 2.3 - 4.6 mg/dL  Comprehensive metabolic panel with GFR   Collection Time: 08/27/24  8:25 AM  Result Value Ref Range   Sodium 139 135 - 145 mEq/L   Potassium 4.6 3.5 - 5.1 mEq/L   Chloride 102 96 - 112 mEq/L   CO2 27 19 - 32 mEq/L   Glucose, Bld 136 (H) 70 - 99 mg/dL   BUN 32 (H) 6 - 23 mg/dL   Creatinine, Ser 8.53 0.40 - 1.50 mg/dL   Total Bilirubin 0.4 0.2 - 1.2 mg/dL   Alkaline Phosphatase 58 39 - 117 U/L   AST 24 0 - 37 U/L   ALT 36 0 - 53 U/L   Total Protein 7.1 6.0 - 8.3 g/dL   Albumin 4.6 3.5 - 5.2 g/dL   GFR 49.45 (L) >39.99 mL/min   Calcium  9.4 8.4 - 10.5 mg/dL  Lipid panel   Collection Time: 08/27/24  8:25 AM  Result Value Ref Range   Cholesterol 104 0 - 200 mg/dL   Triglycerides 793.9 (H) 0.0 - 149.0  mg/dL   HDL 67.09 (L) >60.99 mg/dL   VLDL 58.7 (H) 0.0 - 59.9 mg/dL   LDL Cholesterol 30 0 - 99 mg/dL   Total CHOL/HDL Ratio 3    NonHDL 71.00   Microalbumin / creatinine urine ratio   Collection Time: 08/27/24  8:25 AM  Result Value Ref Range   Microalb, Ur 1.7 0.0 - 1.9 mg/dL   Creatinine,U 743.4 mg/dL   Microalb Creat Ratio 6.7 0.0 - 30.0 mg/g  Hemoglobin A1c   Collection Time: 08/27/24  8:25 AM  Result Value Ref Range   Hgb A1c MFr Bld 6.7 (H) 4.6 - 6.5 %    Assessment & Plan:   Problem List Items Addressed This Visit     Essential hypertension   Chronic, stable period on diovan  hct with good BP control - discussed importance of avoid dehydration status to keep kidneys  healthy and hydrated with these meds. If progressive kidney disease noted, low threshold to stop hydrochlorothiazide  component.       Relevant Medications   valsartan -hydrochlorothiazide  (DIOVAN -HCT) 160-12.5 MG tablet   Irritable bowel syndrome   Discussed suspect etiology of symptoms - IBS-D. Declines medication for cramping. Will start probiotic daily ie align.       GAD (generalized anxiety disorder)   Chronic, stable period on lexapro  20mg  daily - continue.       Relevant Medications   escitalopram  (LEXAPRO ) 20 MG tablet   Health maintenance examination - Primary (Chronic)   Preventative protocols reviewed and updated unless pt declined. Discussed healthy diet and lifestyle.       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 Continues gabapentin  300mg  nightly with benefit.       Relevant Medications   gabapentin  (NEURONTIN ) 300 MG capsule   Type 2 diabetes mellitus with ophthalmic complication, without long-term current use of insulin  (HCC)   Chronic, stable period on low dose metformin  XR and ozempic  1mg  weekly - continue.  Consider stopping metformin  XR if worsening loose stools.       Relevant Medications   Semaglutide , 1 MG/DOSE, (OZEMPIC , 1 MG/DOSE,) 4 MG/3ML SOPN   valsartan -hydrochlorothiazide  (DIOVAN -HCT) 160-12.5 MG tablet   Dyslipidemia associated with type 2 diabetes mellitus (HCC)   Chronic, great LDL control on low dose atorvastatin , but triglycerides remain elevated. Encouraged diet choices to improve trig control. The ASCVD Risk score (Arnett DK, et al., 2019) failed to calculate for the following reasons:   The valid total cholesterol range is 130 to 320 mg/dL       Relevant Medications   Semaglutide , 1 MG/DOSE, (OZEMPIC , 1 MG/DOSE,) 4 MG/3ML SOPN   valsartan -hydrochlorothiazide  (DIOVAN -HCT) 160-12.5 MG tablet   CKD stage 3 due to type 2 diabetes mellitus (HCC)   Reviewed dx with patient.  Will continue monitoring this.        Relevant Medications   Semaglutide , 1 MG/DOSE, (OZEMPIC , 1 MG/DOSE,) 4 MG/3ML SOPN   valsartan -hydrochlorothiazide  (DIOVAN -HCT) 160-12.5 MG tablet   NAFLD (nonalcoholic fatty liver disease)   LFTs remain stable on blood work.       Vitamin D  deficiency   Has not been regular with replacement - start 1000 units daily.       Low serum vitamin B12   Low normal range on metformin  - rec start b12 500mcg MWF dosing OTC.       Umbilical hernia   Tiny - doubt it will cause trouble - will continue to monitor.       Other Visit  Diagnoses       Need for influenza vaccination       Relevant Orders   Flu vaccine trivalent PF, 6mos and older(Flulaval,Afluria,Fluarix,Fluzone) (Completed)     Special screening for malignant neoplasms, colon       Relevant Orders   Ambulatory referral to Gastroenterology     Need for pneumococcal vaccination       Relevant Orders   Pneumococcal conjugate vaccine 20-valent (Prevnar 20) (Completed)        Meds ordered this encounter  Medications   Semaglutide , 1 MG/DOSE, (OZEMPIC , 1 MG/DOSE,) 4 MG/3ML SOPN    Sig: Inject 1 mg into the skin once a week.    Dispense:  9 mL    Refill:  3   valsartan -hydrochlorothiazide  (DIOVAN -HCT) 160-12.5 MG tablet    Sig: Take 1 tablet by mouth daily.    Dispense:  90 tablet    Refill:  3   cyanocobalamin (V-R VITAMIN B-12) 500 MCG tablet    Sig: Take 1 tablet (500 mcg total) by mouth every Monday, Wednesday, and Friday.   Cholecalciferol (VITAMIN D3) 25 MCG (1000 UT) CAPS    Sig: Take 1 capsule (1,000 Units total) by mouth daily.   escitalopram  (LEXAPRO ) 20 MG tablet    Sig: Take 1 tablet (20 mg total) by mouth daily.    Dispense:  90 tablet    Refill:  3   gabapentin  (NEURONTIN ) 300 MG capsule    Sig: TAKE 1 CAPSULE BY MOUTH EVERYDAY AT BEDTIME    Dispense:  90 capsule    Refill:  3    Orders Placed This Encounter  Procedures   Flu vaccine trivalent PF, 6mos and  older(Flulaval,Afluria,Fluarix,Fluzone)   Pneumococcal conjugate vaccine 20-valent (Prevnar 20)   Ambulatory referral to Gastroenterology    Referral Priority:   Routine    Referral Type:   Consultation    Referral Reason:   Specialty Services Required    Number of Visits Requested:   1    Patient Instructions  Prevnar-20 today  Flu shot today  For IBS type symptoms - increase fiber and waters. Exclude gas producing foods (beans, onions, celery, carrots, raisins, bananas, apricots, prunes, brussel sprouts, wheat germ, pretzels). Consider trail of lactose free diet (back off milk). Trial of align for IBS symptoms (OTC), or [facility] colon [facility] brand probiotic. Let us  know if worsening to consider stopping metformin  XR.  Start b12 and D supplementation as per updated med list Return in 6 months for diabetes follow up visit.  Follow up plan: Return in about 6 months (around 03/03/2025) for follow up visit.  Anton Blas, MD

## 2024-09-09 DIAGNOSIS — M7712 Lateral epicondylitis, left elbow: Secondary | ICD-10-CM | POA: Diagnosis not present

## 2024-09-09 DIAGNOSIS — E119 Type 2 diabetes mellitus without complications: Secondary | ICD-10-CM | POA: Diagnosis not present

## 2024-09-10 LAB — GENECONNECT MOLECULAR SCREEN: Genetic Analysis Overall Interpretation: NEGATIVE

## 2024-11-05 ENCOUNTER — Other Ambulatory Visit: Payer: Self-pay

## 2024-11-05 ENCOUNTER — Telehealth: Payer: Self-pay

## 2024-11-05 DIAGNOSIS — Z860100 Personal history of colon polyps, unspecified: Secondary | ICD-10-CM

## 2024-11-05 MED ORDER — NA SULFATE-K SULFATE-MG SULF 17.5-3.13-1.6 GM/177ML PO SOLN: 354.0000 mL | Freq: Once | ORAL | 0 refills | Status: AC

## 2024-11-05 NOTE — Telephone Encounter (Signed)
 Gastroenterology Pre-Procedure Review  Request Date: 01/07/2025 Requesting Physician: Dr. Jinny  PATIENT REVIEW QUESTIONS: The patient responded to the following health history questions as indicated:    1. Are you having any GI issues? no 2. Do you have a personal history of Polyps? yes (05/29/2019 polyps Dr. Janalyn) 3. Do you have a family history of Colon Cancer or Polyps? no 4. Diabetes Mellitus? yes (Metformin  and Ozempic ) 5. Joint replacements in the past 12 months?no 6. Major health problems in the past 3 months?no 7. Any artificial heart valves, MVP, or defibrillator?no    MEDICATIONS & ALLERGIES:    Patient reports the following regarding taking any anticoagulation/antiplatelet therapy:   Plavix, Coumadin, Eliquis, Xarelto, Lovenox , Pradaxa, Brilinta, or Effient? no Aspirin? no  Patient confirms/reports the following medications:  Current Outpatient Medications  Medication Sig Dispense Refill   ALPRAZolam  (NIRAVAM ) 0.5 MG dissolvable tablet Take 1 tablet (0.5 mg total) by mouth daily as needed for anxiety (plane rides). 15 tablet 0   atorvastatin  (LIPITOR) 10 MG tablet TAKE 1 TABLET BY MOUTH EVERY DAY 90 tablet 3   Cholecalciferol (VITAMIN D3) 25 MCG (1000 UT) CAPS Take 1 capsule (1,000 Units total) by mouth daily.     cyanocobalamin  (V-R VITAMIN B-12) 500 MCG tablet Take 1 tablet (500 mcg total) by mouth every Monday, Wednesday, and Friday.     escitalopram  (LEXAPRO ) 20 MG tablet Take 1 tablet (20 mg total) by mouth daily. 90 tablet 3   gabapentin  (NEURONTIN ) 300 MG capsule TAKE 1 CAPSULE BY MOUTH EVERYDAY AT BEDTIME 90 capsule 3   ibuprofen  (ADVIL ,MOTRIN ) 200 MG tablet Take 200 mg by mouth as needed.     metFORMIN  (GLUCOPHAGE -XR) 500 MG 24 hr tablet TAKE 1 TABLET (500 MG TOTAL) BY MOUTH DAILY. 90 tablet 3   Na Sulfate-K Sulfate-Mg Sulfate concentrate (SUPREP) 17.5-3.13-1.6 GM/177ML SOLN Take 1 kit (354 mLs total) by mouth once for 1 dose. Starting at 5 PM take one bottle  and pour into the supplied cup, add cool water to the fill 16 oz line and drink all. Then 5 hours before procedure pour the second bottle into the supplied cup, add cool water to the fill 16 oz line and drink all. 354 mL 0   Semaglutide , 1 MG/DOSE, (OZEMPIC , 1 MG/DOSE,) 4 MG/3ML SOPN Inject 1 mg into the skin once a week. 9 mL 3   valsartan -hydrochlorothiazide  (DIOVAN -HCT) 160-12.5 MG tablet Take 1 tablet by mouth daily. 90 tablet 3   No current facility-administered medications for this visit.    Patient confirms/reports the following allergies:  Allergies  Allergen Reactions   Ace Inhibitors Cough    No orders of the defined types were placed in this encounter.   AUTHORIZATION INFORMATION Primary Insurance: 1D#: Group #:  Secondary Insurance: 1D#: Group #:  SCHEDULE INFORMATION: Date: 01/07/2025 Time: Location: [facility] Dr. Jinny

## 2024-11-06 ENCOUNTER — Telehealth: Payer: Self-pay

## 2024-11-06 NOTE — Telephone Encounter (Signed)
 Medical Clearance for colonoscopy scheduled 01/07/2025, under general anesthesia. Please Advise if pt needs appt, last seen in office 09/03/2024. Paperwork placed in Dr. Rilla tray.

## 2024-11-08 NOTE — Telephone Encounter (Addendum)
 Received request from [facility] for medical clearance for upcoming colonoscopy under general anesthesia.   Pt has h/o diastolic CHF, CKD, T2DM on ozempic  and metformin .  Preop evaluation: This is not a high risk surgery (intraperitoneal, intrathoracic, or suprainguinal vascular) No history of ischemic heart disease, cerebrovascular disease, preoperative insulin  use, or chronic kidney disease with preop creatinine >2.  Remote h/o dCHF - euvolemic and asxs from this standpoint.  Revised cardiac risk index score = 1  Recommend: cleared to have procedure, form filled out and placed in my outbox. Will need to hold ozempic  prior to procedure per GI recommendations.

## 2024-11-18 NOTE — Telephone Encounter (Signed)
 Cardiac clearance was obtained for patient to have his colonoscopy by Dr. Anton Blas.

## 2025-01-02 ENCOUNTER — Telehealth: Payer: Self-pay

## 2025-01-02 DIAGNOSIS — Z860100 Personal history of colon polyps, unspecified: Secondary | ICD-10-CM

## 2025-01-02 NOTE — Telephone Encounter (Signed)
 Patient's colonoscopy has been rescheduled to 02/28/25 with Dr. Theophilus due to he is on Spring Break.  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  Instructions sent via mychart.  Referral updated.   Trish in Endo notified.  Message routed to [facility] as she scheduled originally to make her aware as well of his procedure reschedule for when the clearance comes back.  Message routed to [facility] and referral was updated.   Thanks,  [place], CMA

## 2025-02-28 ENCOUNTER — Encounter: Admission: RE | Payer: Self-pay | Source: Home / Self Care

## 2025-02-28 ENCOUNTER — Ambulatory Visit: Admission: RE | Admit: 2025-02-28 | Source: Home / Self Care | Admitting: Gastroenterology
